# Patient Record
Sex: Female | Born: 1935 | Race: White | Hispanic: No | Smoking: Never smoker
Health system: Southern US, Community
[De-identification: ages and names within clinical notes are randomized; demographics above are authoritative.]

## PROBLEM LIST (undated history)

## (undated) DIAGNOSIS — Z860100 Personal history of colon polyps, unspecified: Secondary | ICD-10-CM

## (undated) DIAGNOSIS — C801 Malignant (primary) neoplasm, unspecified: Secondary | ICD-10-CM

## (undated) DIAGNOSIS — M545 Low back pain, unspecified: Secondary | ICD-10-CM

## (undated) DIAGNOSIS — I10 Essential (primary) hypertension: Secondary | ICD-10-CM

## (undated) DIAGNOSIS — J45909 Unspecified asthma, uncomplicated: Secondary | ICD-10-CM

## (undated) DIAGNOSIS — M25473 Effusion, unspecified ankle: Secondary | ICD-10-CM

## (undated) DIAGNOSIS — R413 Other amnesia: Secondary | ICD-10-CM

## (undated) DIAGNOSIS — R0602 Shortness of breath: Secondary | ICD-10-CM

## (undated) DIAGNOSIS — C50419 Malignant neoplasm of upper-outer quadrant of unspecified female breast: Secondary | ICD-10-CM

## (undated) DIAGNOSIS — F0390 Unspecified dementia without behavioral disturbance: Secondary | ICD-10-CM

## (undated) DIAGNOSIS — E611 Iron deficiency: Secondary | ICD-10-CM

## (undated) DIAGNOSIS — C7650 Malignant neoplasm of unspecified lower limb: Secondary | ICD-10-CM

## (undated) DIAGNOSIS — E785 Hyperlipidemia, unspecified: Secondary | ICD-10-CM

## (undated) DIAGNOSIS — D649 Anemia, unspecified: Secondary | ICD-10-CM

## (undated) DIAGNOSIS — K219 Gastro-esophageal reflux disease without esophagitis: Secondary | ICD-10-CM

## (undated) DIAGNOSIS — M1990 Unspecified osteoarthritis, unspecified site: Secondary | ICD-10-CM

## (undated) DIAGNOSIS — I4891 Unspecified atrial fibrillation: Secondary | ICD-10-CM

## (undated) HISTORY — DX: Unspecified asthma, uncomplicated: J45.909

## (undated) HISTORY — DX: Essential (primary) hypertension: I10

## (undated) HISTORY — PX: MASTECTOMY: SHX3

## (undated) HISTORY — DX: Gastro-esophageal reflux disease without esophagitis: K21.9

## (undated) HISTORY — DX: Low back pain: M54.5

## (undated) HISTORY — DX: Hyperlipidemia, unspecified: E78.5

## (undated) HISTORY — DX: Other amnesia: R41.3

## (undated) HISTORY — DX: Unspecified atrial fibrillation: I48.91

## (undated) HISTORY — DX: Anemia, unspecified: D64.9

## (undated) HISTORY — DX: Malignant (primary) neoplasm, unspecified: C80.1

## (undated) HISTORY — DX: Shortness of breath: R06.02

## (undated) HISTORY — DX: Effusion, unspecified ankle: M25.473

## (undated) HISTORY — DX: Malignant neoplasm of upper-outer quadrant of unspecified female breast: C50.419

## (undated) HISTORY — DX: Personal history of colonic polyps: Z86.010

## (undated) HISTORY — DX: Unspecified dementia, unspecified severity, without behavioral disturbance, psychotic disturbance, mood disturbance, and anxiety: F03.90

## (undated) HISTORY — DX: Malignant neoplasm of unspecified lower limb: C76.50

## (undated) HISTORY — DX: Low back pain, unspecified: M54.50

## (undated) HISTORY — DX: Iron deficiency: E61.1

## (undated) HISTORY — DX: Personal history of colon polyps, unspecified: Z86.0100

## (undated) HISTORY — DX: Unspecified osteoarthritis, unspecified site: M19.90

---

## 2005-07-04 ENCOUNTER — Ambulatory Visit: Payer: Self-pay | Admitting: Internal Medicine

## 2006-07-10 ENCOUNTER — Ambulatory Visit: Payer: Self-pay | Admitting: Family Medicine

## 2006-07-15 ENCOUNTER — Ambulatory Visit: Payer: Self-pay | Admitting: Family

## 2006-09-10 HISTORY — PX: SKIN CANCER EXCISION: SHX779

## 2007-01-15 ENCOUNTER — Ambulatory Visit: Payer: Self-pay | Admitting: Family Medicine

## 2007-01-16 ENCOUNTER — Ambulatory Visit: Payer: Self-pay | Admitting: Family Medicine

## 2007-06-12 ENCOUNTER — Other Ambulatory Visit: Payer: Self-pay

## 2007-06-12 ENCOUNTER — Ambulatory Visit: Payer: Self-pay | Admitting: Surgery

## 2007-06-16 ENCOUNTER — Ambulatory Visit: Payer: Self-pay | Admitting: Surgery

## 2007-07-23 ENCOUNTER — Ambulatory Visit: Payer: Self-pay | Admitting: Family Medicine

## 2007-07-30 ENCOUNTER — Ambulatory Visit: Payer: Self-pay | Admitting: Family Medicine

## 2007-09-11 HISTORY — PX: BREAST BIOPSY: SHX20

## 2008-03-03 ENCOUNTER — Ambulatory Visit: Payer: Self-pay | Admitting: Family Medicine

## 2008-03-10 ENCOUNTER — Ambulatory Visit: Payer: Self-pay | Admitting: Family Medicine

## 2008-09-10 HISTORY — PX: COLONOSCOPY: SHX174

## 2008-11-10 ENCOUNTER — Ambulatory Visit: Payer: Self-pay | Admitting: General Surgery

## 2009-02-02 ENCOUNTER — Ambulatory Visit: Payer: Self-pay | Admitting: Gastroenterology

## 2009-05-04 ENCOUNTER — Ambulatory Visit: Payer: Self-pay | Admitting: Family Medicine

## 2009-07-27 ENCOUNTER — Ambulatory Visit: Payer: Self-pay | Admitting: Family Medicine

## 2009-09-10 DIAGNOSIS — C801 Malignant (primary) neoplasm, unspecified: Secondary | ICD-10-CM

## 2009-09-10 DIAGNOSIS — I4891 Unspecified atrial fibrillation: Secondary | ICD-10-CM

## 2009-09-10 HISTORY — DX: Unspecified atrial fibrillation: I48.91

## 2009-09-10 HISTORY — DX: Malignant (primary) neoplasm, unspecified: C80.1

## 2010-01-18 ENCOUNTER — Ambulatory Visit: Payer: Self-pay | Admitting: Family Medicine

## 2010-02-01 ENCOUNTER — Ambulatory Visit: Payer: Self-pay | Admitting: Family Medicine

## 2010-02-28 ENCOUNTER — Ambulatory Visit: Payer: Self-pay | Admitting: General Surgery

## 2010-03-06 ENCOUNTER — Ambulatory Visit: Payer: Self-pay | Admitting: Cardiovascular Disease

## 2010-03-06 DIAGNOSIS — I10 Essential (primary) hypertension: Secondary | ICD-10-CM

## 2010-03-06 DIAGNOSIS — I4891 Unspecified atrial fibrillation: Secondary | ICD-10-CM

## 2010-03-07 ENCOUNTER — Ambulatory Visit: Payer: Self-pay

## 2010-03-10 ENCOUNTER — Ambulatory Visit: Payer: Self-pay | Admitting: Cardiovascular Disease

## 2010-03-10 HISTORY — PX: BREAST SURGERY: SHX581

## 2010-03-14 ENCOUNTER — Ambulatory Visit: Payer: Self-pay | Admitting: Cardiovascular Disease

## 2010-03-29 ENCOUNTER — Inpatient Hospital Stay: Payer: Self-pay | Admitting: General Surgery

## 2010-06-08 ENCOUNTER — Inpatient Hospital Stay: Payer: Self-pay | Admitting: Internal Medicine

## 2010-06-08 ENCOUNTER — Ambulatory Visit: Payer: Self-pay | Admitting: Internal Medicine

## 2010-06-08 ENCOUNTER — Encounter: Payer: Self-pay | Admitting: Cardiovascular Disease

## 2010-06-10 ENCOUNTER — Encounter: Payer: Self-pay | Admitting: Cardiovascular Disease

## 2010-06-12 ENCOUNTER — Telehealth: Payer: Self-pay | Admitting: Cardiovascular Disease

## 2010-06-13 ENCOUNTER — Telehealth: Payer: Self-pay | Admitting: Cardiovascular Disease

## 2010-06-15 ENCOUNTER — Ambulatory Visit: Payer: Self-pay | Admitting: Cardiovascular Disease

## 2010-06-21 ENCOUNTER — Ambulatory Visit: Payer: Self-pay | Admitting: Cardiovascular Disease

## 2010-06-21 LAB — CONVERTED CEMR LAB

## 2010-06-28 ENCOUNTER — Ambulatory Visit: Payer: Self-pay | Admitting: Internal Medicine

## 2010-07-05 ENCOUNTER — Ambulatory Visit: Payer: Self-pay | Admitting: Cardiovascular Disease

## 2010-07-05 LAB — CONVERTED CEMR LAB: POC INR: 1.7

## 2010-07-12 ENCOUNTER — Ambulatory Visit: Payer: Self-pay | Admitting: Cardiovascular Disease

## 2010-07-12 LAB — CONVERTED CEMR LAB: POC INR: 2

## 2010-07-26 ENCOUNTER — Ambulatory Visit: Payer: Self-pay | Admitting: Cardiovascular Disease

## 2010-08-09 ENCOUNTER — Ambulatory Visit: Payer: Self-pay | Admitting: Cardiovascular Disease

## 2010-08-09 LAB — CONVERTED CEMR LAB: POC INR: 1.8

## 2010-08-23 ENCOUNTER — Ambulatory Visit: Payer: Self-pay | Admitting: Cardiovascular Disease

## 2010-08-23 LAB — CONVERTED CEMR LAB: POC INR: 2.3

## 2010-08-24 ENCOUNTER — Telehealth: Payer: Self-pay | Admitting: Cardiovascular Disease

## 2010-09-15 ENCOUNTER — Ambulatory Visit: Admission: RE | Admit: 2010-09-15 | Discharge: 2010-09-15 | Payer: Self-pay | Source: Home / Self Care

## 2010-09-15 ENCOUNTER — Ambulatory Visit
Admission: RE | Admit: 2010-09-15 | Discharge: 2010-09-15 | Payer: Self-pay | Source: Home / Self Care | Attending: Cardiovascular Disease | Admitting: Cardiovascular Disease

## 2010-09-15 ENCOUNTER — Encounter: Payer: Self-pay | Admitting: Cardiovascular Disease

## 2010-09-15 LAB — CONVERTED CEMR LAB: POC INR: 2

## 2010-10-02 ENCOUNTER — Encounter: Payer: Self-pay | Admitting: Cardiovascular Disease

## 2010-10-10 NOTE — Medication Information (Signed)
Summary: rov/ewj  Anticoagulant Therapy  Managed by: Cloyde Reams, RN, BSN Referring MD: Dr Mariah Milling PCP: Lorie Phenix, M.D Supervising MD: Mariah Milling Indication 1: Atrial Fibrillation 427.31 Lab Used: [facility] [place] Site: [place] INR POC 1.7 INR RANGE 2.0-3.0  Dietary changes: no    Health status changes: no    Bleeding/hemorrhagic complications: no    Recent/future hospitalizations: no    Any changes in medication regimen? no    Recent/future dental: no  Any missed doses?: no       Is patient compliant with meds? yes       Allergies: No Known Drug Allergies  Anticoagulation Management History:      The patient is taking warfarin and comes in today for a routine follow up visit.  Positive risk factors for bleeding include an age of 60 years or older.  The bleeding index is 'intermediate risk'.  Positive CHADS2 values include History of HTN and Age > 25 years old.  Anticoagulation responsible provider: Goll[place].  INR POC: 1.7.  Cuvette Lot#: [id number].  Exp: 07/2011.    Anticoagulation Management Assessment/Plan:      The patient's current anticoagulation dose is Warfarin sodium 2 mg tabs: Use as directed by Anticoagualtion Clinic.  The target INR is 2.0-3.0.  The next INR is due 07/12/2010.  Results were reviewed/authorized by Cloyde Reams, RN, BSN.  She was notified by Cloyde Reams RN.         Prior Anticoagulation Instructions: INR 1.5  Start taking 1.5 tablets daily except 2 tablets on Mondays, Wednesdays, and Fridays.  Recheck in 1 week.   Current Anticoagulation Instructions: INR 1.7  Start taking 2 tablets daily except 1.5 tablets on Tuesdays and Saturdays.  Recheck in 1 week.

## 2010-10-10 NOTE — Medication Information (Signed)
Summary: rov/ewj  Anticoagulant Therapy  Managed by: Bethena Midget, RN, BSN Referring MD: Dr Mariah Milling PCP: Lorie Phenix, M.D Supervising MD: Mariah Milling Indication 1: Atrial Fibrillation 427.31 Lab Used: [facility] [place] Site: [place] INR POC 1.8 INR RANGE 2.0-3.0  Dietary changes: no    Health status changes: no    Bleeding/hemorrhagic complications: no    Recent/future hospitalizations: no    Any changes in medication regimen? yes       Details: Centrum Silver  Recent/future dental: no  Any missed doses?: no       Is patient compliant with meds? yes       Allergies: No Known Drug Allergies  Anticoagulation Management History:      The patient is taking warfarin and comes in today for a routine follow up visit.  Positive risk factors for bleeding include an age of 75 years or older.  The bleeding index is 'intermediate risk'.  Positive CHADS2 values include History of HTN and Age > 74 years old.  Anticoagulation responsible Jill Ellison: Gollan.  INR POC: 1.8.  Cuvette Lot#: [id number].  Exp: 08/2011.    Anticoagulation Management Assessment/Plan:      The patient's current anticoagulation dose is Warfarin sodium 2 mg tabs: Use as directed by Anticoagualtion Clinic.  The target INR is 2.0-3.0.  The next INR is due 08/09/2010.  Anticoagulation instructions were given to patient.  Results were reviewed/authorized by Bethena Midget, RN, BSN.  She was notified by Bethena Midget, RN, BSN.         Prior Anticoagulation Instructions: INR 2.0  Start taking 2 tablets daily except 1.5 tablets on Tuesdays. Recheck 2 weeks.   Current Anticoagulation Instructions: INR  1.8 Today take 2 1/2 tablets then change dose to 2 tablets everyday. Recheck in 2 weeks.

## 2010-10-10 NOTE — Assessment & Plan Note (Signed)
Summary: ROV   Visit Type:  Follow-up Primary Fontaine Hehl:  Lorie Phenix, M.D  CC:  F/U [facility].  [name]  History of Present Illness: Jill Ellison is a very pleasant 77 year old woman with a history of chronic back pain, recent history of lumpectomy identifying breast cancer, mastectomy on the left with Dr. Doristine Counter who presents 4 routine followup. She was diagnosed with atrial fibrillation prior to her surgery and was not started on warfarin as her surgery was pending.  she was started on rate following medications such as diltiazem, digoxin with Lasix. She states that she did well through her surgery and currently has no complaints. She denies any chest pain, shortness of breath. She does have slight edema in her legs which comes and goes. She does drink a significant amount of fluid including water.She denies any periods of tachycardia and is able to ambulate without any falls and has no significant gait instability.   EKG today shows atrial fibrillation with rate of in the 70s with no significant ST or T wave changes  Current Medications (verified): 1)  Boniva 150 Mg Tabs (Ibandronate Sodium) .... Once A Month As Directed 2)  Xopenex Hfa 45 Mcg/act Aero (Levalbuterol Tartrate) .... Two Puffs Every Six Hours 3)  Cyclobenzaprine Hcl 5 Mg Tabs (Cyclobenzaprine Hcl) .... As Needed 4)  Fexofenadine Hcl 180 Mg Tabs (Fexofenadine Hcl) .[name].. 1 Once Daily 5)  Hydrocodone-Acetaminophen 5-500 Mg Tabs (Hydrocodone-Acetaminophen) .... As Needed 6)  Ferrex 150 Forte 150-25-1 Mg-Mcg-Mg Caps (Iron Polysacch Cmplx-B12-Fa) .[name].. 1 Capsule Two Times A Day 7)  Os-Cal 500 + D 500-200 Mg-Unit Tabs (Calcium Carbonate-Vitamin D) .[name].. 1 Two Times A Day 8)  Magnesium 250 Mg Tabs (Magnesium) .... Four Tablets A Day 9)  Vitamin D3 1000 Unit Tabs (Cholecalciferol) .[name].. 1 Once Daily 10)  Cardizem Cd 240 Mg Xr24h-Cap (Diltiazem Hcl Coated Beads) .[name].. 1 Once Daily 11)  Digoxin 0.125 Mg Tabs (Digoxin) .... Take Two Tablets Today  and Then One Tablet Daily 12)  Lisinopril 20 Mg Tabs (Lisinopril) .... Take One Tablet By Mouth Daily 13)  Potassium Chloride Crys Cr 20 Meq Cr-Tabs (Potassium Chloride Crys Cr) .... Take One Tablet By Mouth Daily 14)  Furosemide 40 Mg Tabs (Furosemide) .... One Tablet Once Daily  Allergies (verified): No Known Drug Allergies  Past History:  Past Medical History: Last updated: 03/06/2010 swelling in ankles shortness of breath hypertension  Past Surgical History: Last updated: 03/06/2010 skin cancer removed on right leg- 2008  Family History: Last updated: 03/06/2010 Family History of Cancer: mother, father, siblings Family History of Diabetes: brother Family History of Hypertension: siblings Family History of Sudden Cardiac Death: brother Family History of Coronary Artery Disease: brothers  Social History: Last updated: 03/06/2010 Retired from [employer] Tobacco Use - No.  Alcohol Use - no Regular Exercise - yes- yard work Drug Use - no Widowed   Risk Factors: Exercise: yes (03/06/2010)  Risk Factors: Smoking Status: never (03/06/2010)  Review of Systems       The patient complains of peripheral edema.  The patient denies fever, weight loss, weight gain, vision loss, decreased hearing, hoarseness, chest pain, syncope, dyspnea on exertion, prolonged cough, abdominal pain, incontinence, muscle weakness, depression, and enlarged lymph nodes.    Vital Signs:  Patient profile:   77 year old female Height:      61 inches Weight:      145 pounds BMI:     27.50 Pulse rate:   73 / minute BP sitting:  158 / 67  (left arm) Cuff size:   regular  Vitals Entered By: Bishop Dublin, CMA (June 15, 2010 2:43 PM)  Physical Exam  General:  Well developed, well nourished, in no acute distress. Head:  normocephalic and atraumatic Neck:  Neck supple, no JVD. No masses, thyromegaly or abnormal cervical nodes. Lungs:  Clear bilaterally to auscultation and  percussion. Heart:  Non-displaced PMI, chest non-tender; irregular rate and rhythm, S1, S2 without murmurs, rubs or gallops. Carotid upstroke normal, no bruit. . Pedals normal pulses. Trace edema, no varicosities. Abdomen:  Bowel sounds positive; abdomen soft and non-tender without masses Msk:  Back normal, normal gait. Muscle strength and tone normal. Pulses:  pulses normal in all 4 extremities Extremities:  No clubbing or cyanosis. Neurologic:  Alert and oriented x 3. Skin:  Intact without lesions or rashes. Psych:  Normal affect.   Impression & Recommendations:  Problem # 1:  ATRIAL FIBRILLATION (ICD-427.31) we have discussed with Ms. Bradeen the various treatment options for her atrial fibrillation. She currently has adequate rate control. We have talked about anticoagulation with her. Given that her echocardiogram is essentially normal with only mild abnormalities, we could potentially aimed for cardioversion in one to 2 months time. With this in mind, we will start her on warfarin with a goal INR of 2 or higher, preferably less than 2.5.  Her updated medication list for this problem includes:    Digoxin 0.125 Mg Tabs (Digoxin) .[name]... Take two tablets today and then one tablet daily    Warfarin Sodium 2 Mg Tabs (Warfarin sodium) ..... Use as directed by anticoagualtion clinic  Problem # 2:  HYPERTENSION, BENIGN (ICD-401.1) Pressure on recheck was well controlled. No further medication changes were made.  Her updated medication list for this problem includes:    Cardizem Cd 240 Mg Xr24h-cap (Diltiazem hcl coated beads) .[name]... 1 once daily    Lisinopril 20 Mg Tabs (Lisinopril) .[name]... Take one tablet by mouth daily    Furosemide 40 Mg Tabs (Furosemide) ..... One tablet once daily  Patient Instructions: 1)  Your physician recommends that you schedule a follow-up appointment in: Next Wednesday with Cicero Duck 2)  Your physician has recommended you make the following change in your  medication: Start warfarin 2mg  daily  3)  Your physician wants you to follow-up in:   3 months You will receive a reminder letter in the mail two months in advance. If you don't receive a letter, please call our office to schedule the follow-up appointment. Prescriptions: WARFARIN SODIUM 2 MG TABS (WARFARIN SODIUM) Use as directed by Anticoagualtion Clinic  #45 x 2   Entered by:   Benedict Needy, RN   Authorized by:   Dossie Arbour MD   Signed by:   Benedict Needy, RN on 06/15/2010   Method used:   [facility] to        [facility] [phone]* (retail)       [address] [place], [place]  [address]       Ph: [phone] or [phone]       Fax: [phone]   RxID:   [id number]   Appended Document: ROV INR can be checked either by Dr. Elease Hashimoto or our office. I will defer this to Dr. Elease Hashimoto.

## 2010-10-10 NOTE — Assessment & Plan Note (Signed)
Summary: SURGICAL CLEARANCE   Visit Type:  Initial Consult Primary Provider:  Lorie Phenix, M.D  CC:  cardiac clearance for right mastectomy tomorrow.  History of Present Illness: [name] is a very pleasant 77 year old woman with a history of chronic back pain, recent history of lumpectomy identifying breast cancer, scheduled for a mastectomy with Dr. Doristine Counter who presents for preoperative evaluation. Her surgery is scheduled for tomorrow morning.  She was seen in preoperative evaluation last week and found to be in atrial fibrillation. Heart rate at that time was greater than 100 beats per minute. She denies any significant shortness of breath, no chest pain, no lightheadedness. She is able to be active and do everything that she would like to do. She denies any lower extremity edema, no cough, no lightheadedness. She is able to push a boggy around [employer] with her sister at a slow to medium pace and does not have to slow down.  EKG today shows atrial fibrillation with rate of 107 beats per minute, no significant ST or T wave changes noted.   Preventive Screening-Counseling & Management  Alcohol-Tobacco     Smoking Status: never  Caffeine-Diet-Exercise     Does Patient Exercise: yes      Drug Use:  no.    Current Medications (verified): 1)  Boniva 150 Mg Tabs (Ibandronate Sodium) .... Once A Month As Directed 2)  Xopenex Hfa 45 Mcg/act Aero (Levalbuterol Tartrate) .... Two Puffs Every Six Hours 3)  Cyclobenzaprine Hcl 5 Mg Tabs (Cyclobenzaprine Hcl) .... As Needed 4)  Fexofenadine Hcl 180 Mg Tabs (Fexofenadine Hcl) .[name].. 1 Once Daily 5)  Amlodipine Besy-Benazepril Hcl 10-20 Mg Caps (Amlodipine Besy-Benazepril Hcl) .[name].. 1 Once Daily 6)  Hydrocodone-Acetaminophen 5-500 Mg Tabs (Hydrocodone-Acetaminophen) .... As Needed 7)  Ferrex 150 Forte 150-25-1 Mg-Mcg-Mg Caps (Iron Polysacch Cmplx-B12-Fa) .[name].. 1 Capsule Two Times A Day 8)  Ibuprofen 200 Mg Tabs (Ibuprofen) .... As Needed 9)   Os-Cal 500 + D 500-200 Mg-Unit Tabs (Calcium Carbonate-Vitamin D) .[name].. 1 Two Times A Day 10)  Magnesium 250 Mg Tabs (Magnesium) .... Four Tablets A Day 11)  Vitamin D3 1000 Unit Tabs (Cholecalciferol) .[name].. 1 Once Daily  Allergies (verified): No Known Drug Allergies  Past History:  Family History: Last updated: 03/06/2010 Family History of Cancer: mother, father, siblings Family History of Diabetes: brother Family History of Hypertension: siblings Family History of Sudden Cardiac Death: brother Family History of Coronary Artery Disease: brothers  Social History: Last updated: 03/06/2010 Retired from [employer] Tobacco Use - No.  Alcohol Use - no Regular Exercise - yes- yard work Drug Use - no Widowed   Risk Factors: Exercise: yes (03/06/2010)  Risk Factors: Smoking Status: never (03/06/2010)  Past Medical History: swelling in ankles shortness of breath hypertension  Past Surgical History: skin cancer removed on right leg- 2008  Family History: Family History of Cancer: mother, father, siblings Family History of Diabetes: brother Family History of Hypertension: siblings Family History of Sudden Cardiac Death: brother Family History of Coronary Artery Disease: brothers  Social History: Retired from [employer] Tobacco Use - No.  Alcohol Use - no Regular Exercise - yes- yard work Drug Use - no Widowed  Smoking Status:  never Does Patient Exercise:  yes Drug Use:  no  Review of Systems  The patient denies fever, weight loss, weight gain, vision loss, decreased hearing, hoarseness, chest pain, syncope, dyspnea on exertion, peripheral edema, prolonged cough, abdominal pain, incontinence, muscle weakness, depression, and enlarged lymph nodes.  Vital Signs:  Patient profile:   77 year old female Height:      61 inches Weight:      150 pounds BMI:     28.44 Pulse rate:   107 / minute BP sitting:   128 / 70  (left arm) Cuff size:   regular  Vitals Entered  By: Bishop Dublin, CMA (March 06, 2010 3:26 PM)  Physical Exam  General:  Well developed, well nourished, in no acute distress. Head:  normocephalic and atraumatic Neck:  Neck supple, no JVD. No masses, thyromegaly or abnormal cervical nodes. Lungs:  Clear bilaterally to auscultation and percussion. Heart:  Non-displaced PMI, chest non-tender; irregular rate and rhythm, S1, S2 without murmurs, rubs or gallops. Carotid upstroke normal, no bruit. . Pedals normal pulses. No edema, no varicosities. Abdomen:  Bowel sounds positive; abdomen soft and non-tender without masses Msk:  Back normal, normal gait. Muscle strength and tone normal. Pulses:  pulses normal in all 4 extremities Extremities:  No clubbing or cyanosis. Neurologic:  Alert and oriented x 3. Skin:  Intact without lesions or rashes. Psych:  Normal affect.   Impression & Recommendations:  Problem # 1:  ATRIAL FIBRILLATION (ICD-427.31) new atrial fibrillation noted on EKG last week and again today. I suspect that she has been in atrial fibrillation since sometime in December when she first noted an irregular heartbeat. She has not had any significant limitations in her ability to exert herself. Her rate is moderately elevated, >100 bpm  We have talked with Dr. Doristine Counter and we will put her mastectomy surgery on hold for several days. In the meantime, we have ordered an echocardiogram for tomorrow, have held her amlodipine and benazepril and started her on diltiazem 180 mg daily.  We will perform an EKG on Friday to confirm that she has adequate rate control and make further adjustments at that time. If her rate is well controlled, she could proceed with surgery per Dr. Doristine Counter. We talked to her about warfarin and this could be started after her surgery. Her INR could be managed through our office  Orders: Echocardiogram (Echo)  Problem # 2:  HYPERTENSION, BENIGN (ICD-401.1) Blood pressure is reasonably well controlled. We are  changing some of her medications to optimize her rate control and will follow up with an EKG and blood pressure check on Friday.  The following medications were removed from the medication list:    Amlodipine Besy-benazepril Hcl 10-20 Mg Caps (Amlodipine besy-benazepril hcl) .[name]... 1 once daily Her updated medication list for this problem includes:    Diltiazem Hcl Er Beads 180 Mg Xr24h-cap (Diltiazem hcl er beads) .[name]... Take one capsule by mouth daily  Patient Instructions: 1)  Your physician recommends that you schedule a follow-up appointment in: Nurse visit friday afternoon 2)  Your physician has recommended you make the following change in your medication: STOP amolodipine-benzapril START diltiazem 180mg  daily.  3)  Your physician has requested that you have an echocardiogram.  Echocardiography is a painless test that uses sound waves to create images of your heart. It provides your doctor with information about the size and shape of your heart and how well your heart's chambers and valves are working.  This procedure takes approximately one hour. There are no restrictions for this procedure. ASAP  Prescriptions: DILTIAZEM HCL ER BEADS 180 MG XR24H-CAP (DILTIAZEM HCL ER BEADS) Take one capsule by mouth daily  #30 x 6   Entered by:   Benedict Needy, RN   Authorized by:  Dossie Arbour MD   Signed by:   Benedict Needy, RN on 03/06/2010   Method used:   [facility] to        [facility] [phone]* (retail)       [address] [place], [place]  [address]       Ph: [phone] or [phone]       Fax: [phone]   RxID:   [id number]

## 2010-10-10 NOTE — Assessment & Plan Note (Signed)
Summary: ekg  Nurse Visit   Vital Signs:  Patient profile:   77 year old female Height:      61 inches Weight:      149 pounds BMI:     28.26 Pulse rate:   95 / minute BP sitting:   137 / 78  (right arm) Cuff size:   regular  Vitals Entered By: Bishop Dublin, CMA (March 10, 2010 11:12 AM)  Current Medications (verified): 1)  Boniva 150 Mg Tabs (Ibandronate Sodium) .... Once A Month As Directed 2)  Xopenex Hfa 45 Mcg/act Aero (Levalbuterol Tartrate) .... Two Puffs Every Six Hours 3)  Cyclobenzaprine Hcl 5 Mg Tabs (Cyclobenzaprine Hcl) .... As Needed 4)  Fexofenadine Hcl 180 Mg Tabs (Fexofenadine Hcl) .[name].. 1 Once Daily 5)  Hydrocodone-Acetaminophen 5-500 Mg Tabs (Hydrocodone-Acetaminophen) .... As Needed 6)  Ferrex 150 Forte 150-25-1 Mg-Mcg-Mg Caps (Iron Polysacch Cmplx-B12-Fa) .[name].. 1 Capsule Two Times A Day 7)  Ibuprofen 200 Mg Tabs (Ibuprofen) .... As Needed 8)  Os-Cal 500 + D 500-200 Mg-Unit Tabs (Calcium Carbonate-Vitamin D) .[name].. 1 Two Times A Day 9)  Magnesium 250 Mg Tabs (Magnesium) .... Four Tablets A Day 10)  Vitamin D3 1000 Unit Tabs (Cholecalciferol) .[name].. 1 Once Daily 11)  Cardizem Cd 240 Mg Xr24h-Cap (Diltiazem Hcl Coated Beads) .[name].. 1 Once Daily 12)  Digoxin 0.125 Mg Tabs (Digoxin) .... Take Two Tablets Today and Then One Tablet Daily  Allergies (verified): No Known Drug Allergies  Patient Instructions: 1)  Your physician recommends that you schedule a follow-up appointment in: 2 weeks after procedure 2)  Your physician has recommended you make the following change in your medication: Start digoxin 0.125 2 today and then one tablet daily and diltiazem  increased to 240mg  daily.    Orders Added: 1)  EKG w/ Interpretation [93000] Prescriptions: CARDIZEM CD 240 MG XR24H-CAP (DILTIAZEM HCL COATED BEADS) 1 once daily  #30 x 1   Entered by:   Bishop Dublin, CMA   Authorized by:   Dossie Arbour MD   Signed by:   Bishop Dublin, CMA on 03/10/2010   Method used:    [facility]ron[facility] to        [facility] [phone]* (retail)       [address] [place], [place]  [address]       Ph: [phone] or [phone]       Fax: [phone]   RxID:   [id number] DIGOXIN 0.125 MG TABS (DIGOXIN) take two tablets today and then one tablet daily  #32 x 1   Entered by:   Bishop Dublin, CMA   Authorized by:   Dossie Arbour MD   Signed by:   Bishop Dublin, CMA on 03/10/2010   Method used:   Electronically to        [facility] [phone]* (retail)       [address] [place], [place]  [address]       Ph: [phone] or [phone]       Fax: [phone]   RxID:   [id number]

## 2010-10-10 NOTE — Medication Information (Signed)
Summary: NEWCCR/ALT  Anticoagulant Therapy  Managed by: Cloyde Reams, RN, BSN Referring MD: Dr Mariah Milling PCP: Jill Ellison, M.D Supervising MD: Mariah Milling Indication 1: Atrial Fibrillation 427.31 Lab Used: [facility] [name] Site: [place] INR POC 1.2 INR RANGE 2.0-3.0  Dietary changes: yes       Details: Pt educated on importance of consistancy of vit K in the diet. Vit K list of foods given to pt.   Health status changes: no    Bleeding/hemorrhagic complications: yes       Details: Pt educated to monitor for s+s of bleeding and to call with problems or concerns.   Recent/future hospitalizations: no    Any changes in medication regimen? yes       Details: Medications reviewed and updated.  Pt is aware to contact us with any new or discontinued medications.   Recent/future dental: no  Any missed doses?: no       Is patient compliant with meds? no     Details: Educated pt on importance of medication compliance, taking Coumadin daily at the same time of day.   Comments: Started on Coumadin 06/15/10 2mg  daily. New pt education done.   Allergies: No Known Drug Allergies  Anticoagulation Management History:      The patient is taking warfarin and comes in today for a routine follow up visit.  Positive risk factors for bleeding include an age of 75 years or older.  The bleeding index is 'intermediate risk'.  Positive CHADS2 values include History of HTN and Age > 55 years old.  Anticoagulation responsible Jill Ellison: Jill Ellison.  INR POC: 1.2.  Cuvette Lot#: [id number].  Exp: 07/2011.    Anticoagulation Management Assessment/Plan:      The patient's current anticoagulation dose is Warfarin sodium 2 mg tabs: Use as directed by Anticoagualtion Clinic.  The target INR is 2.0-3.0.  The next INR is due 06/28/2010.  Results were reviewed/authorized by Cloyde Reams, RN, BSN.  She was notified by Cloyde Reams RN.         Current Anticoagulation Instructions: INR 1.2  Start taking  1.5 tablets daily.  Recheck in 1 week.

## 2010-10-10 NOTE — Letter (Signed)
Summary: Historic Patient File  Historic Patient File   Imported By: West Carbo 03/07/2010 10:27:11  _____________________________________________________________________  External Attachment:    Type:   Image     Comment:   External Document

## 2010-10-10 NOTE — Consult Note (Signed)
[facility]   [facility]   Imported By: Roderic Ovens 06/21/2010 10:17:27  _____________________________________________________________________  External Attachment:    Type:   Image     Comment:   External Document

## 2010-10-10 NOTE — Letter (Signed)
Summary: Historic Patient File  Historic Patient File   Imported By: West Carbo 03/07/2010 10:26:50  _____________________________________________________________________  External Attachment:    Type:   Image     Comment:   External Document

## 2010-10-10 NOTE — Medication Information (Signed)
Summary: rov/ewj  Anticoagulant Therapy  Managed by: Cloyde Reams, RN, BSN Referring MD: Dr Mariah Milling PCP: Lorie Phenix, M.D Supervising MD: Mariah Milling Indication 1: Atrial Fibrillation 427.31 Lab Used: [facility] [place] Site: [place] INR POC 2.0 INR RANGE 2.0-3.0           Allergies: No Known Drug Allergies  Anticoagulation Management History:      The patient is taking warfarin and comes in today for a routine follow up visit.  Positive risk factors for bleeding include an age of 75 years or older.  The bleeding index is 'intermediate risk'.  Positive CHADS2 values include History of HTN and Age > 75 years old.  Anticoagulation responsible provider: Gollan.  INR POC: 2.0.  Cuvette Lot#: [id number].  Exp: 07/2011.    Anticoagulation Management Assessment/Plan:      The patient's current anticoagulation dose is Warfarin sodium 2 mg tabs: Use as directed by Anticoagualtion Clinic.  The target INR is 2.0-3.0.  The next INR is due 07/26/2010.  Results were reviewed/authorized by Cloyde Reams, RN, BSN.  She was notified by Cloyde Reams RN.         Prior Anticoagulation Instructions: INR 1.7  Start taking 2 tablets daily except 1.5 tablets on Tuesdays and Saturdays.  Recheck in 1 week.   Current Anticoagulation Instructions: INR 2.0  Start taking 2 tablets daily except 1.5 tablets on Tuesdays. Recheck 2 weeks.

## 2010-10-10 NOTE — Medication Information (Signed)
Summary: rov/tm  Anticoagulant Therapy  Managed by: Bethena Midget, RN, BSN Referring MD: Dr Mariah Milling PCP: Lorie Phenix, M.D Supervising MD: Mariah Milling Indication 1: Atrial Fibrillation 427.31 Lab Used: [facility] [place] Site: [place] INR POC 1.8 INR RANGE 2.0-3.0  Dietary changes: no    Health status changes: no    Bleeding/hemorrhagic complications: no    Recent/future hospitalizations: no    Any changes in medication regimen? no    Recent/future dental: no  Any missed doses?: no       Is patient compliant with meds? yes       Current Medications (verified): 1)  Boniva 150 Mg Tabs (Ibandronate Sodium) .... Once A Month As Directed 2)  Xopenex Hfa 45 Mcg/act Aero (Levalbuterol Tartrate) .... Two Puffs Every Six Hours 3)  Cyclobenzaprine Hcl 5 Mg Tabs (Cyclobenzaprine Hcl) .... As Needed 4)  Fexofenadine Hcl 180 Mg Tabs (Fexofenadine Hcl) .[name].. 1 Once Daily 5)  Hydrocodone-Acetaminophen 5-500 Mg Tabs (Hydrocodone-Acetaminophen) .... As Needed 6)  Ferrex 150 Forte 150-25-1 Mg-Mcg-Mg Caps (Iron Polysacch Cmplx-B12-Fa) .[name].. 1 Capsule Two Times A Day 7)  Os-Cal 500 + D 500-200 Mg-Unit Tabs (Calcium Carbonate-Vitamin D) .[name].. 1 Two Times A Day 8)  Magnesium 250 Mg Tabs (Magnesium) .... Four Tablets A Day 9)  Vitamin D3 1000 Unit Tabs (Cholecalciferol) .[name].. 1 Once Daily 10)  Cardizem Cd 240 Mg Xr24h-Cap (Diltiazem Hcl Coated Beads) .[name].. 1 Once Daily 11)  Digoxin 0.125 Mg Tabs (Digoxin) .... Take Two Tablets Today and Then One Tablet Daily 12)  Lisinopril 20 Mg Tabs (Lisinopril) .... Take One Tablet By Mouth Daily 13)  Potassium Chloride Crys Cr 20 Meq Cr-Tabs (Potassium Chloride Crys Cr) .... Take One Tablet By Mouth Daily 14)  Furosemide 40 Mg Tabs (Furosemide) .... One Tablet Once Daily 15)  Warfarin Sodium 2 Mg Tabs (Warfarin Sodium) .... Use As Directed By [facility] 16)  Centrum Silver Ultra Womens  Tabs (Multiple Vitamins-Minerals) .... Take 1  Daily  Allergies: No Known Drug Allergies  Anticoagulation Management History:      The patient is taking warfarin and comes in today for a routine follow up visit.  Positive risk factors for bleeding include an age of 62 years or older.  The bleeding index is 'intermediate risk'.  Positive CHADS2 values include History of HTN and Age > 46 years old.  Anticoagulation responsible Mataya Kilduff: Gollan.  INR POC: 1.8.  Cuvette Lot#: [id number].  Exp: 08/2011.    Anticoagulation Management Assessment/Plan:      The patient's current anticoagulation dose is Warfarin sodium 2 mg tabs: Use as directed by Anticoagualtion Clinic.  The target INR is 2.0-3.0.  The next INR is due 08/23/2010.  Anticoagulation instructions were given to patient.  Results were reviewed/authorized by Bethena Midget, RN, BSN.  She was notified by Bethena Midget, RN, BSN.         Prior Anticoagulation Instructions: INR  1.8 Today take 2 1/2 tablets then change dose to 2 tablets everyday. Recheck in 2 weeks.   Current Anticoagulation Instructions: INR 1.8 Today take 3 pills then change dose to 2 pills everyday except 3 pills on Wednesdays. Recheck in 2 weeks.

## 2010-10-10 NOTE — Assessment & Plan Note (Signed)
Summary: EKG/Rapid Heart Beat/sgc,cma  Nurse Visit   Vital Signs:  Patient profile:   77 year old female Height:      61 inches Weight:      148 pounds BMI:     28.07 Pulse rate:   79 / minute BP sitting:   148 / 82  (left arm) Cuff size:   regular  Vitals Entered By: Bishop Dublin, CMA (March 14, 2010 10:02 AM)   Allergies: No Known Drug Allergies  Orders Added: 1)  EKG w/ Interpretation [93000]

## 2010-10-10 NOTE — Medication Information (Signed)
Summary: rov/ewj  Anticoagulant Therapy  Managed by: Cloyde Reams, RN, BSN Referring MD: Dr Mariah Milling PCP: Lorie Phenix, M.D Supervising MD: Gala Romney MD, Reuel Boom Indication 1: Atrial Fibrillation 427.31 Lab Used: [facility] [place] Site: [place] INR POC 1.5 INR RANGE 2.0-3.0  Dietary changes: no    Health status changes: no    Bleeding/hemorrhagic complications: no    Recent/future hospitalizations: no    Any changes in medication regimen? no    Recent/future dental: no  Any missed doses?: no       Is patient compliant with meds? yes       Allergies: No Known Drug Allergies  Anticoagulation Management History:      The patient is taking warfarin and comes in today for a routine follow up visit.  Positive risk factors for bleeding include an age of 75 years or older.  The bleeding index is 'intermediate risk'.  Positive CHADS2 values include History of HTN and Age > 25 years old.  Anticoagulation responsible provider: Bensimhon MD, Reuel Boom.  INR POC: 1.5.  Cuvette Lot#: [id number].  Exp: 07/2011.    Anticoagulation Management Assessment/Plan:      The patient's current anticoagulation dose is Warfarin sodium 2 mg tabs: Use as directed by Anticoagualtion Clinic.  The target INR is 2.0-3.0.  The next INR is due 07/05/2010.  Results were reviewed/authorized by Cloyde Reams, RN, BSN.  She was notified by Cloyde Reams RN.         Prior Anticoagulation Instructions: INR 1.2  Start taking 1.5 tablets daily.  Recheck in 1 week.    Current Anticoagulation Instructions: INR 1.5  Start taking 1.5 tablets daily except 2 tablets on Mondays, Wednesdays, and Fridays.  Recheck in 1 week.

## 2010-10-10 NOTE — Progress Notes (Signed)
Summary: MEDICATION  Phone Note Call from Patient Call back at [phone]   Caller: PAM TURNER (GRAND DAUGHTER) Call For: [facility] Summary of Call: CALLING AGAIN ABOUT PT'S MEDICATION-DID NOT HEAR BACK YESTERDAY-THE NOTE FROM YESTERDAY IS SIGNED. Initial call taken by: Harlon Flor,  June 13, 2010 9:10 AM  Follow-up for Phone Call        Continue all meds as she was doing before the hospital, needs to start lisinopril 20 mg daily. Also needs to monitor BP and call us next week with numbers.  Does she have a lasix 20 mg that she can take as needed for SOB?     Appended Document: MEDICATION    Clinical Lists Changes  Medications: Added new medication of LISINOPRIL 20 MG TABS (LISINOPRIL) Take one tablet by mouth daily - Signed Added new medication of POTASSIUM CHLORIDE CRYS CR 20 MEQ CR-TABS (POTASSIUM CHLORIDE CRYS CR) Take one tablet by mouth daily - Signed Rx of LISINOPRIL 20 MG TABS (LISINOPRIL) Take one tablet by mouth daily;  #30 x 6;  Signed;  Entered by: Benedict Needy, RN;  Authorized by: Dossie Arbour MD;  Method used: [facility]. [phone]*, [address], [place], [place], [place]  [address], Ph: [phone] or [phone], Fax: [phone] Rx of POTASSIUM CHLORIDE CRYS CR 20 MEQ CR-TABS (POTASSIUM CHLORIDE CRYS CR) Take one tablet by mouth daily;  #30 x 2;  Signed;  Entered by: Benedict Needy, RN;  Authorized by: Dossie Arbour MD;  Method used: Electronically to [facility]. [phone]*, [address], [place], [place], [place]  [address], Ph: [phone] or [phone], Fax: [phone]    Prescriptions: POTASSIUM CHLORIDE CRYS CR 20 MEQ CR-TABS (POTASSIUM CHLORIDE CRYS CR) Take one tablet by mouth daily  #30 x 2   Entered by:   Benedict Needy, RN   Authorized by:   Dossie Arbour MD   Signed by:   Benedict Needy, RN on 06/14/2010   Method used:   Electronically to        [facility]. [phone]* (retail)       [address]       [place], [place]  [address]       Ph: [phone] or [phone]       Fax: [phone]   RxID:   [id number] LISINOPRIL 20 MG TABS (LISINOPRIL) Take one tablet by mouth daily  #30 x 6   Entered by:   Benedict Needy, RN   Authorized by:   Dossie Arbour MD   Signed by:   Benedict Needy, RN on 06/14/2010   Method used:   Electronically to        [facility]. [phone]* (retail)       [address] [place], [place]  [address]       Ph: [phone] or [phone]       Fax: [phone]   RxID:   [id number]    Appended Document: MEDICATION LMOM TCB with pt's granddaughter Margrett Rud.   Appended Document: MEDICATION spoke to pt's granddaughter about new medications and s/s of low and high BP. Instructed family to take pt's Bp 2-3 times per day while medications are being titrated. Dr. Mariah Milling suggested that the pt come to office for BMP, she has appt tomorrow.

## 2010-10-10 NOTE — Progress Notes (Signed)
Summary: medication  Phone Note Other Incoming Call back at [phone]   Caller: Margrett Rud )(grandaughter) Details for Reason: Medication questions Summary of Call: Was at [facility] over the weekend and was given Lasix 20 mg one tablet daily and Lotrel 10/40mg  one tablet daily.  The grandaughter is confused on what medications she needs to be taking.  She understood for her to stop the Diltiazem and Digoxin.  Does she need to also take a aspirin daily? Initial call taken by: Bishop Dublin, CMA,  June 12, 2010 11:34 AM  Follow-up for Phone Call        need to get ER notes. Why was she in the ER?

## 2010-10-10 NOTE — Letter (Signed)
Summary[facility]ist, [facility]   [facility]   Imported By: Roderic Ovens 08/15/2010 16:05:45  _____________________________________________________________________  External Attachment:    Type:   Image     Comment:   External Document

## 2010-10-11 ENCOUNTER — Encounter: Payer: Self-pay | Admitting: Cardiovascular Disease

## 2010-10-11 ENCOUNTER — Ambulatory Visit: Admit: 2010-10-11 | Payer: Self-pay

## 2010-10-11 ENCOUNTER — Encounter (INDEPENDENT_AMBULATORY_CARE_PROVIDER_SITE_OTHER): Payer: Medicare Other

## 2010-10-11 DIAGNOSIS — Z7901 Long term (current) use of anticoagulants: Secondary | ICD-10-CM

## 2010-10-11 DIAGNOSIS — I4891 Unspecified atrial fibrillation: Secondary | ICD-10-CM

## 2010-10-11 LAB — CONVERTED CEMR LAB: POC INR: 1.7

## 2010-10-12 LAB — CONVERTED CEMR LAB
Calcium: 8.4 mg/dL (ref 8.4–10.5)
Creatinine, Ser: 0.83 mg/dL (ref 0.40–1.20)
Sodium: 141 meq/L (ref 135–145)

## 2010-10-12 NOTE — Medication Information (Signed)
Summary: seeing Dr Jill Ellison at 10:15am/ewj  Anticoagulant Therapy  Managed by: Jill Hurst, RN Referring MD: Dr Jill Ellison PCP: Jill Ellison, M.D Supervising MD: Jill Ellison Indication 1: Atrial Fibrillation 427.31 Lab Used: [facility] [place] Site: [place] INR POC 2.0 INR RANGE 2.0-3.0  Dietary changes: no    Health status changes: no    Bleeding/hemorrhagic complications: no    Recent/future hospitalizations: no    Any changes in medication regimen? no    Recent/future dental: no  Any missed doses?: no       Is patient compliant with meds? yes       Allergies: No Known Drug Allergies   Anticoagulation Management History:      The patient is taking warfarin and comes in today for a routine follow up visit.  Positive risk factors for bleeding include an age of 75 years or older.  The bleeding index is 'intermediate risk'.  Positive CHADS2 values include History of HTN and Age > 51 years old.  Anticoagulation responsible provider: Hector Taft.  INR POC: 2.0.  Exp: 08/2011.    Anticoagulation Management Assessment/Plan:      The patient's current anticoagulation dose is Warfarin sodium 2 mg tabs: Use as directed by Anticoagualtion Clinic.  The target INR is 2.0-3.0.  The next INR is due 10/11/2010.  Anticoagulation instructions were given to patient.  Results were reviewed/authorized by Jill Hurst, RN.  She was notified by Jill Hurst RN.         Prior Anticoagulation Instructions: INR 2.3  Continue on same dosage 2 tablets daily except 3 tablets on Wednesdays.  Recheck in 3 weeks.    Current Anticoagulation Instructions: INR 2.0  Continue same dosage 2 tablets daily except 3 tablets on Wednesdays. Recheck in 4 weeks.

## 2010-10-12 NOTE — Assessment & Plan Note (Signed)
Summary: F3M/AMD   Visit Type:  Follow-up Primary Provider:  Lorie Phenix, M.D  CC:  "Doing well"..  History of Present Illness: Jill Ellison is a very pleasant 77 year old woman with a history of chronic back pain, recent history of lumpectomy identifying breast cancer, mastectomy on the left with Dr. Doristine Counter who presents for routine followup. She was diagnosed with atrial fibrillation prior to her surgery and was not started on warfarin as her surgery was pending.  she was started on rate controlling medications including diltiazem, digoxin with Lasix. following surgery, she was started on warfarin. She denies any chest pain, shortness of breath. no significant edema in her lower extremities.She denies any periods of tachycardia and is able to ambulate without any falls and has no significant gait instability.  she reports that her blood pressure is elevated today because of some anxiety in getting to the office and problems with traffic.  EKG today shows atrial fibrillation with rate of in the 70s with no significant ST or T wave changes  Current Medications (verified): 1)  Boniva 150 Mg Tabs (Ibandronate Sodium) .... Once A Month As Directed 2)  Xopenex Hfa 45 Mcg/act Aero (Levalbuterol Tartrate) .... Two Puffs Every Six Hours 3)  Cyclobenzaprine Hcl 5 Mg Tabs (Cyclobenzaprine Hcl) .... As Needed 4)  Fexofenadine Hcl 180 Mg Tabs (Fexofenadine Hcl) .[name].. 1 Once Daily 5)  Hydrocodone-Acetaminophen 5-500 Mg Tabs (Hydrocodone-Acetaminophen) .... As Needed 6)  Ferrex 150 Forte 150-25-1 Mg-Mcg-Mg Caps (Iron Polysacch Cmplx-B12-Fa) .[name].. 1 Capsule Two Times A Day 7)  Os-Cal 500 + D 500-200 Mg-Unit Tabs (Calcium Carbonate-Vitamin D) .[name].. 1 Two Times A Day 8)  Magnesium 250 Mg Tabs (Magnesium) .... Four Tablets A Day 9)  Vitamin D3 1000 Unit Tabs (Cholecalciferol) .[name].. 1 Once Daily 10)  Cardizem Cd 240 Mg Xr24h-Cap (Diltiazem Hcl Coated Beads) .[name].. 1 Once Daily 11)  Digoxin 0.125 Mg Tabs  (Digoxin) .... Take Two Tablets Today and Then One Tablet Daily 12)  Lisinopril 20 Mg Tabs (Lisinopril) .... Take One Tablet By Mouth Daily 13)  Potassium Chloride Crys Cr 20 Meq Cr-Tabs (Potassium Chloride Crys Cr) .... Take One Tablet By Mouth Daily 14)  Furosemide 20 Mg Tabs (Furosemide) .... One Tablet Once Daily 15)  Warfarin Sodium 2 Mg Tabs (Warfarin Sodium) .... Use As Directed By [facility] 16)  Centrum Silver Ultra Womens  Tabs (Multiple Vitamins-Minerals) .... Take 1 Daily  Allergies (verified): No Known Drug Allergies  Past History:  Past Medical History: Last updated: 03/06/2010 swelling in ankles shortness of breath hypertension  Past Surgical History: Last updated: 03/06/2010 skin cancer removed on right leg- 2008  Family History: Last updated: 03/06/2010 Family History of Cancer: mother, father, siblings Family History of Diabetes: brother Family History of Hypertension: siblings Family History of Sudden Cardiac Death: brother Family History of Coronary Artery Disease: brothers  Social History: Last updated: 03/06/2010 Retired from [employer] Tobacco Use - No.  Alcohol Use - no Regular Exercise - yes- yard work Drug Use - no Widowed   Risk Factors: Exercise: yes (03/06/2010)  Risk Factors: Smoking Status: never (03/06/2010)  Review of Systems  The patient denies fever, weight loss, weight gain, vision loss, decreased hearing, hoarseness, chest pain, syncope, dyspnea on exertion, peripheral edema, prolonged cough, abdominal pain, incontinence, muscle weakness, depression, and enlarged lymph nodes.    Vital Signs:  Patient profile:   77 year old female Height:      61 inches Weight:      146 pounds  BMI:     27.69 Pulse rate:   65 / minute BP sitting:   160 / 78  (left arm) Cuff size:   regular  Vitals Entered By: Bishop Dublin, CMA (September 15, 2010 10:08 AM)  Physical Exam  General:  Well developed, well nourished, in no acute  distress. Head:  normocephalic and atraumatic Neck:  Neck supple, no JVD. No masses, thyromegaly or abnormal cervical nodes. Lungs:  Clear bilaterally to auscultation and percussion. Heart:  Non-displaced PMI, chest non-tender; irregular rate and rhythm, S1, S2 without murmurs, rubs or gallops. Carotid upstroke normal, no bruit. . Pedals normal pulses. Trace edema, no varicosities. Abdomen:  Bowel sounds positive; abdomen soft and non-tender without masses Msk:  Back normal, normal gait. Muscle strength and tone normal. Pulses:  pulses normal in all 4 extremities Extremities:  No clubbing or cyanosis. Neurologic:  Alert and oriented x 3. Skin:  Intact without lesions or rashes. Psych:  Normal affect.   Impression & Recommendations:  Problem # 1:  ATRIAL FIBRILLATION (ICD-427.31) her heart rate is well controlled on her current medication regimen.. We have discussed the various treatment options including continued anticoagulation with rate control versus DC cardioversion. She would like to think about it and let us know what she would like to do. I asked her to call the office if she would like to schedule a cardioversion.  Echocardiogram did not show any significant cardiac pathology concerning for recurrence of her atrial fibrillation. No significant valvular disease.  Her updated medication list for this problem includes:    Digoxin 0.125 Mg Tabs (Digoxin) .[name]... Take two tablets today and then one tablet daily    Warfarin Sodium 2 Mg Tabs (Warfarin sodium) ..... Use as directed by anticoagualtion clinic  Orders: EKG w/ Interpretation (93000) T-Digoxin ([id number])  Problem # 2:  HYPERTENSION, BENIGN (ICD-401.1) Her blood pressure is elevated and have asked her to monitor her blood pressure at home. She reports typically her systolic pressure is in the 130s. She states she was stressed into the office.  Her updated medication list for this problem includes:    Cardizem Cd 240  Mg Xr24h-cap (Diltiazem hcl coated beads) .[name]... 1 once daily    Lisinopril 20 Mg Tabs (Lisinopril) .[name]... Take one tablet by mouth daily    Furosemide 20 Mg Tabs (Furosemide) ..... One tablet once daily  Orders: T-Basic Metabolic Panel [id number])  Problem # 3:  LONG-TERM (CURRENT) USE OF OTHER MEDICATIONS (ICD-V58.69) She appears to be doing well on her warfarin. No falls. We'll watch her closely.  We will check a basic metabolic panel and digoxin level today as she has been on diuretic and digoxin for several months.  Orders: T-Digoxin ([id number])  Patient Instructions: 1)  Your physician recommends that you schedule a follow-up appointment in: 6 months 2)  Your physician recommends that you continue on your current medications as directed. Please refer to the Current Medication list given to you today. 3)  Your physician has requested that you regularly monitor and record your blood pressure readings at home.  Please use the same machine at the same time of day to check your readings and record them to bring to your follow-up visit and drop off list of measurments at our office in 2 weeks.

## 2010-10-12 NOTE — Medication Information (Signed)
Summary: rov/tm  Anticoagulant Therapy  Managed by: Jill Reams, RN, BSN Referring MD: Dr Jill Ellison PCP: Jill Ellison, Jill Ellison Supervising MD: Jill Ellison Indication 1: Atrial Fibrillation 427.31 Lab Used: [facility] [place] Site: [place] INR POC 2.3 INR RANGE 2.0-3.0  Dietary changes: no    Health status changes: no    Bleeding/hemorrhagic complications: no    Recent/future hospitalizations: no    Any changes in medication regimen? no    Recent/future dental: no  Any missed doses?: no       Is patient compliant with meds? yes       Allergies: No Known Drug Allergies  Anticoagulation Management History:      The patient is taking warfarin and comes in today for a routine follow up visit.  Positive risk factors for bleeding include an age of 75 years or older.  The bleeding index is 'intermediate risk'.  Positive CHADS2 values include History of HTN and Age > 50 years old.  Anticoagulation responsible provider: Gollan.  INR POC: 2.3.  Cuvette Lot#: [id number].  Exp: 08/2011.    Anticoagulation Management Assessment/Plan:      The patient's current anticoagulation dose is Warfarin sodium 2 mg tabs: Use as directed by Anticoagualtion Clinic.  The target INR is 2.0-3.0.  The next INR is due 09/15/2010.  Anticoagulation instructions were given to patient.  Results were reviewed/authorized by Jill Reams, RN, BSN.  She was notified by Jill Reams RN.         Prior Anticoagulation Instructions: INR 1.8 Today take 3 pills then change dose to 2 pills everyday except 3 pills on Wednesdays. Recheck in 2 weeks.   Current Anticoagulation Instructions: INR 2.3  Continue on same dosage 2 tablets daily except 3 tablets on Wednesdays.  Recheck in 3 weeks.

## 2010-10-12 NOTE — Progress Notes (Signed)
Summary: RX  Phone Note Refill Request Call back at Home Phone [phone] Message from:  Patient on August 24, 2010 4:41 PM  Refills Requested: Medication #1:  FUROSEMIDE 40 MG TABS one tablet once daily   Notes: SHOULD BE [facility] [address]  Initial call taken by: Harlon Flor,  August 24, 2010 4:42 PM Caller: SELF Call For: [facility]  Follow-up for Phone Call        Changed medication to furosemide 20 mg.  The last D/C summary from [facility] states furosemide 20 mg and not the furosemide 40 mg.  Patient states has taken 20 mg one tablet once daily. Follow-up by: Bishop Dublin, CMA,  August 25, 2010 8:29 AM    New/Updated Medications: FUROSEMIDE 20 MG TABS (FUROSEMIDE) one tablet once daily Prescriptions: FUROSEMIDE 20 MG TABS (FUROSEMIDE) one tablet once daily  #30 x 6   Entered by:   Bishop Dublin, CMA   Authorized by:   Dossie Arbour MD   Signed by:   Bishop Dublin, CMA on 08/25/2010   Method used:   [facility]ronically to        [facility] [phone]* (retail)       [address] [place], [place]  [address]       Ph: [phone] or [phone]       Fax: [phone]   RxID:   [id number]

## 2010-10-18 NOTE — Letter (Signed)
Summary: At Home BP Readings  At Home BP Readings   Imported By: Harlon Flor 10/13/2010 12:38:52  _____________________________________________________________________  External Attachment:    Type:   Image     Comment:   External Document  Appended Document: At Home BP Readings Preliminarily reviewed. Forwarded to MD desktop for review and signature /MES

## 2010-10-18 NOTE — Medication Information (Signed)
Summary: ROV/AMD  Anticoagulant Therapy  Managed by: Cloyde Reams, RN, BSN Referring MD: Dr Mariah Milling PCP: Lorie Phenix, M.D Supervising MD: Mariah Milling Indication 1: Atrial Fibrillation 427.31 Lab Used: [facility] [place] Site: [place] INR POC 1.7 INR RANGE 2.0-3.0  Dietary changes: no    Health status changes: no    Bleeding/hemorrhagic complications: no    Recent/future hospitalizations: no    Any changes in medication regimen? no    Recent/future dental: no  Any missed doses?: no       Is patient compliant with meds? yes       Allergies: No Known Drug Allergies  Anticoagulation Management History:      The patient is taking warfarin and comes in today for a routine follow up visit.  Positive risk factors for bleeding include an age of 37 years or older.  The bleeding index is 'intermediate risk'.  Positive CHADS2 values include History of HTN and Age > 42 years old.  Anticoagulation responsible provider: Theotis Gerdeman.  INR POC: 1.7.  Exp: 08/2011.    Anticoagulation Management Assessment/Plan:      The patient's current anticoagulation dose is Warfarin sodium 2 mg tabs: Use as directed by Anticoagualtion Clinic.  The target INR is 2.0-3.0.  The next INR is due 11/01/2010.  Anticoagulation instructions were given to patient.  Results were reviewed/authorized by Cloyde Reams, RN, BSN.  She was notified by Cloyde Reams RN.         Prior Anticoagulation Instructions: INR 2.0  Continue same dosage 2 tablets daily except 3 tablets on Wednesdays. Recheck in 4 weeks.  Current Anticoagulation Instructions: INR 1.7  Start taking 2 tablets daily except 3 tablets on Wednesdays and Saturdays.  Recheck in 3 weeks.   Prescriptions: WARFARIN SODIUM 2 MG TABS (WARFARIN SODIUM) Use as directed by Anticoagualtion Clinic  #75 x 3   Entered by:   Cloyde Reams RN   Authorized by:   Dossie Arbour MD   Signed by:   Cloyde Reams RN on 10/11/2010   Method used:   Electronically  to        [facility] [phone]* (retail)       [address] [place], [place]  [address]       Ph: [phone] or [phone]       Fax: [phone]   RxID:   [id number]

## 2010-10-18 NOTE — Letter (Signed)
Summary: At Home BP Readings  At Home BP Readings   Imported By: Harlon Flor 10/09/2010 11:58:21  _____________________________________________________________________  External Attachment:    Type:   Image     Comment:   External Document  Appended Document: At Home BP Readings Preliminarily reviewed. Forwarded to MD desktop for review and signature /MES  Appended Document: At Home BP Readings Most recent BP number have been looking great!

## 2010-11-01 ENCOUNTER — Encounter (INDEPENDENT_AMBULATORY_CARE_PROVIDER_SITE_OTHER): Payer: Medicare Other

## 2010-11-01 ENCOUNTER — Encounter: Payer: Self-pay | Admitting: Cardiovascular Disease

## 2010-11-01 DIAGNOSIS — I4891 Unspecified atrial fibrillation: Secondary | ICD-10-CM

## 2010-11-01 LAB — CONVERTED CEMR LAB: POC INR: 1.6

## 2010-11-07 NOTE — Medication Information (Signed)
Summary: Jill Ellison  Anticoagulant Therapy  Managed by: Cloyde Reams, RN, BSN Referring MD: Dr Mariah Milling PCP: Lorie Phenix, M.D Supervising MD: Mariah Milling Indication 1: Atrial Fibrillation 427.31 Lab Used: [facility] [place] Site: [place] INR POC 1.6 INR RANGE 2.0-3.0  Dietary changes: yes       Details: May have have eaten some additional vit K.   Health status changes: no    Bleeding/hemorrhagic complications: no    Recent/future hospitalizations: no    Any changes in medication regimen? no    Recent/future dental: no  Any missed doses?: yes     Details: Unsure may missed last night's dosage of Coumadin.   Is patient compliant with meds? yes       Allergies: No Known Drug Allergies  Anticoagulation Management History:      The patient is taking warfarin and comes in today for a routine follow up visit.  Positive risk factors for bleeding include an age of 11 years or older.  The bleeding index is 'intermediate risk'.  Positive CHADS2 values include History of HTN and Age > 70 years old.  Anticoagulation responsible provider: Rossetta Kama.  INR POC: 1.6.  Exp: 08/2011.    Anticoagulation Management Assessment/Plan:      The patient's current anticoagulation dose is Warfarin sodium 2 mg tabs: Use as directed by Anticoagualtion Clinic.  The target INR is 2.0-3.0.  The next INR is due 11/15/2010.  Anticoagulation instructions were given to patient.  Results were reviewed/authorized by Cloyde Reams, RN, BSN.  She was notified by Cloyde Reams RN.         Prior Anticoagulation Instructions: INR 1.7  Start taking 2 tablets daily except 3 tablets on Wednesdays and Saturdays.  Recheck in 3 weeks.    Current Anticoagulation Instructions: INR 1.6  Take 4 tablets today, then start taking 2 tablets daily except 3 tablets on Mondays, Wednesdays, and Saturdays.  Recheck in 2 weeks.

## 2010-11-15 ENCOUNTER — Encounter: Payer: Self-pay | Admitting: Internal Medicine

## 2010-11-15 ENCOUNTER — Encounter (INDEPENDENT_AMBULATORY_CARE_PROVIDER_SITE_OTHER): Payer: Medicare Other

## 2010-11-15 DIAGNOSIS — Z7901 Long term (current) use of anticoagulants: Secondary | ICD-10-CM

## 2010-11-15 DIAGNOSIS — I4891 Unspecified atrial fibrillation: Secondary | ICD-10-CM

## 2010-11-15 LAB — CONVERTED CEMR LAB: POC INR: 3

## 2010-11-21 NOTE — Medication Information (Signed)
Summary: rov/ewj  Anticoagulant Therapy  Managed by: Bethena Midget, RN, BSN Referring MD: Dr Mariah Milling PCP: Lorie Phenix, M.D Supervising MD: Gala Romney MD, Reuel Boom Indication 1: Atrial Fibrillation 427.31 Lab Used: [facility] [place] Site: [place] INR POC 3.0 INR RANGE 2.0-3.0  Dietary changes: yes       Details: Pt states she hasn't been eating any green veggies since last visit  Health status changes: no    Bleeding/hemorrhagic complications: no    Recent/future hospitalizations: no    Any changes in medication regimen? no    Recent/future dental: no  Any missed doses?: no       Is patient compliant with meds? yes       Allergies: No Known Drug Allergies  Anticoagulation Management History:      The patient is taking warfarin and comes in today for a routine follow up visit.  Positive risk factors for bleeding include an age of 32 years or older.  The bleeding index is 'intermediate risk'.  Positive CHADS2 values include History of HTN and Age > 53 years old.  Anticoagulation responsible provider: Renee Erb MD, Reuel Boom.  INR POC: 3.0.  Cuvette Lot#: [id number].  Exp: 09/2011.    Anticoagulation Management Assessment/Plan:      The patient's current anticoagulation dose is Warfarin sodium 2 mg tabs: Use as directed by Anticoagualtion Clinic.  The target INR is 2.0-3.0.  The next INR is due 11/29/2010.  Anticoagulation instructions were given to patient.  Results were reviewed/authorized by Bethena Midget, RN, BSN.  She was notified by Bethena Midget, RN, BSN.         Prior Anticoagulation Instructions: INR 1.6  Take 4 tablets today, then start taking 2 tablets daily except 3 tablets on Mondays, Wednesdays, and Saturdays.  Recheck in 2 weeks.    Current Anticoagulation Instructions: INR 3.0 Today only take 1 pill then resume 2 pills everyday except 3 pills on Mondays, Wednesdays and Saturdays. Recheck in 2 weeks.

## 2010-11-25 ENCOUNTER — Encounter: Payer: Self-pay | Admitting: Cardiovascular Disease

## 2010-11-25 DIAGNOSIS — I4891 Unspecified atrial fibrillation: Secondary | ICD-10-CM

## 2010-11-25 DIAGNOSIS — Z7901 Long term (current) use of anticoagulants: Secondary | ICD-10-CM | POA: Insufficient documentation

## 2010-11-29 ENCOUNTER — Ambulatory Visit (INDEPENDENT_AMBULATORY_CARE_PROVIDER_SITE_OTHER): Payer: Medicare Other | Admitting: Emergency Medicine

## 2010-11-29 DIAGNOSIS — Z7901 Long term (current) use of anticoagulants: Secondary | ICD-10-CM

## 2010-11-29 DIAGNOSIS — I4891 Unspecified atrial fibrillation: Secondary | ICD-10-CM

## 2010-11-29 LAB — POCT INR: INR: 1.9

## 2010-11-29 NOTE — Patient Instructions (Signed)
Take 3 tablets today and tomorrow, then resume same dosage 2 tablets daily except 3 tablets on Mondays, Wednesdays, and Saturdays.  Recheck in 3 weeks.

## 2010-12-20 ENCOUNTER — Ambulatory Visit (INDEPENDENT_AMBULATORY_CARE_PROVIDER_SITE_OTHER): Payer: Medicare Other | Admitting: Emergency Medicine

## 2010-12-20 DIAGNOSIS — I4891 Unspecified atrial fibrillation: Secondary | ICD-10-CM

## 2010-12-20 DIAGNOSIS — Z7901 Long term (current) use of anticoagulants: Secondary | ICD-10-CM

## 2010-12-20 LAB — POCT INR: INR: 2.5

## 2011-01-08 ENCOUNTER — Other Ambulatory Visit: Payer: Self-pay | Admitting: Cardiovascular Disease

## 2011-01-08 NOTE — Telephone Encounter (Signed)
rx sent into pharmacy

## 2011-01-17 ENCOUNTER — Ambulatory Visit (INDEPENDENT_AMBULATORY_CARE_PROVIDER_SITE_OTHER): Payer: Medicare Other | Admitting: Emergency Medicine

## 2011-01-17 DIAGNOSIS — Z7901 Long term (current) use of anticoagulants: Secondary | ICD-10-CM

## 2011-01-17 DIAGNOSIS — I4891 Unspecified atrial fibrillation: Secondary | ICD-10-CM

## 2011-01-17 LAB — POCT INR: INR: 2.5

## 2011-02-08 ENCOUNTER — Other Ambulatory Visit: Payer: Self-pay | Admitting: Cardiovascular Disease

## 2011-02-13 ENCOUNTER — Other Ambulatory Visit: Payer: Self-pay | Admitting: Cardiovascular Disease

## 2011-02-13 ENCOUNTER — Ambulatory Visit: Payer: Self-pay | Admitting: General Surgery

## 2011-02-14 ENCOUNTER — Ambulatory Visit (INDEPENDENT_AMBULATORY_CARE_PROVIDER_SITE_OTHER): Payer: Medicare Other | Admitting: Emergency Medicine

## 2011-02-14 DIAGNOSIS — I4891 Unspecified atrial fibrillation: Secondary | ICD-10-CM

## 2011-02-14 DIAGNOSIS — Z7901 Long term (current) use of anticoagulants: Secondary | ICD-10-CM

## 2011-02-14 LAB — POCT INR: INR: 2.5

## 2011-02-27 ENCOUNTER — Other Ambulatory Visit: Payer: Self-pay | Admitting: Emergency Medicine

## 2011-02-27 MED ORDER — POTASSIUM CHLORIDE CRYS ER 20 MEQ PO TBCR: 20.0000 meq | EXTENDED_RELEASE_TABLET | Freq: Every day | ORAL | Status: DC

## 2011-03-06 ENCOUNTER — Other Ambulatory Visit: Payer: Self-pay | Admitting: Cardiovascular Disease

## 2011-03-09 ENCOUNTER — Other Ambulatory Visit: Payer: Self-pay | Admitting: Cardiovascular Disease

## 2011-03-21 ENCOUNTER — Encounter: Payer: Self-pay | Admitting: Cardiovascular Disease

## 2011-03-21 ENCOUNTER — Ambulatory Visit (INDEPENDENT_AMBULATORY_CARE_PROVIDER_SITE_OTHER): Payer: Medicare Other | Admitting: Emergency Medicine

## 2011-03-21 DIAGNOSIS — Z7901 Long term (current) use of anticoagulants: Secondary | ICD-10-CM

## 2011-03-21 DIAGNOSIS — I4891 Unspecified atrial fibrillation: Secondary | ICD-10-CM

## 2011-03-21 LAB — POCT INR: INR: 1.9

## 2011-03-23 ENCOUNTER — Other Ambulatory Visit: Payer: Self-pay | Admitting: Cardiovascular Disease

## 2011-03-23 ENCOUNTER — Encounter: Payer: Self-pay | Admitting: Cardiovascular Disease

## 2011-03-23 ENCOUNTER — Ambulatory Visit (INDEPENDENT_AMBULATORY_CARE_PROVIDER_SITE_OTHER): Payer: Medicare Other | Admitting: Cardiovascular Disease

## 2011-03-23 DIAGNOSIS — I4891 Unspecified atrial fibrillation: Secondary | ICD-10-CM

## 2011-03-23 DIAGNOSIS — Z7901 Long term (current) use of anticoagulants: Secondary | ICD-10-CM

## 2011-03-23 DIAGNOSIS — I10 Essential (primary) hypertension: Secondary | ICD-10-CM

## 2011-03-23 NOTE — Patient Instructions (Signed)
You are doing well. No medication changes were made. Please call us if you have new issues that need to be addressed before your next appt.  We will call you for a follow up Appt. In 6 months  

## 2011-03-23 NOTE — Progress Notes (Signed)
Patient ID: Jill Ellison, female    DOB: [birth year], 77 y.o.   MRN: [id number]  HPI Comments: [name] is a very pleasant 77 year old woman with a history of chronic back pain, recent history of lumpectomy identifying breast cancer, mastectomy on the left with Dr. Doristine Counter,  diagnosed with atrial fibrillation prior to her surgery and was not started on warfarin as her surgery was pending, Presenting for routine followup. she was started on rate controlling medications including diltiazem, digoxin with Lasix. following surgery, she was started on warfarin.   Today, She denies  chest pain, shortness of breath. no significant edema in her lower extremities.She denies any periods of tachycardia and is able to ambulate without any falls and has no significant gait instability. We gave her an inhaler and she reports this helps her shortness of breath. She takes it p.r.n.[name]  She continues to have problems with her back is very active.   EKG shows atrial fibrillation with rate of in the 65 with no significant ST or T wave changes      Outpatient Encounter Prescriptions as of 03/23/2011  Medication Sig Dispense Refill  . calcium-vitamin D (OSCAL WITH D) 500-200 MG-UNIT per tablet Take 1 tablet by mouth 2 (two) times daily.        . Cholecalciferol (VITAMIN D3) 1000 UNITS CAPS Take 1 capsule by mouth daily.        . cyclobenzaprine (FLEXERIL) 5 MG tablet Take 5 mg by mouth 2 (two) times daily as needed.        . digoxin (LANOXIN) 0.125 MG tablet Take 125 mcg by mouth every other day.        . diltiazem (CARDIZEM CD) 240 MG 24 hr capsule EVERY DAY  30 capsule  6  . fexofenadine (ALLEGRA) 180 MG tablet Take 180 mg by mouth daily.        . furosemide (LASIX) 20 MG tablet TAKE 1 TABLET EVERY DAY  30 tablet  6  . HYDROcodone-acetaminophen (VICODIN) 5-500 MG per tablet Take 1 tablet by mouth every 6 (six) hours as needed.        . ibandronate (BONIVA) 150 MG tablet Take 150 mg by mouth every 30 (thirty)  days. Take in the morning with a full glass of water, on an empty stomach, and do not take anything else by mouth or lie down for the next 30 min.       . Iron Polysacch Cmplx-B12-FA (FERREX 150 FORTE) 150-0.025-1 MG CAPS Take 1 capsule by mouth 2 (two) times daily.        [name] letrozole (FEMARA) 2.5 MG tablet Take 2.5 mg by mouth daily.        [name] levalbuterol (XOPENEX HFA) 45 MCG/ACT inhaler Inhale 1-2 puffs into the lungs every 4 (four) hours as needed.        [name] lisinopril (PRINIVIL,ZESTRIL) 20 MG tablet TAKE 1 TABLET BY MOUTH DAILY  30 tablet  6  . Magnesium 250 MG TABS Take 1 tablet by mouth daily.        . Multiple Vitamin (MULTIVITAMIN) tablet Take 1 tablet by mouth daily.        . potassium chloride SA (K-DUR,KLOR-CON) 20 MEQ tablet Take 1 tablet (20 mEq total) by mouth daily.  30 tablet  6  . warfarin (COUMADIN) 2 MG tablet USE AS DIRECTED BY ANTICOAGUALTION CLINIC  75 tablet  3     Review of Systems  Constitutional: Negative.   HENT: Negative.   Eyes: Negative.  Respiratory: Negative.   Cardiovascular: Negative.   Gastrointestinal: Negative.   Musculoskeletal: Positive for back pain.  Skin: Negative.   Neurological: Negative.   Hematological: Negative.   Psychiatric/Behavioral: Negative.   All other systems reviewed and are negative.    BP 135/75  Pulse 79  Ht 5\' 1"  (1.549 m)  Wt 146 lb (66.225 kg)  BMI 27.59 kg/m2  Physical Exam  Nursing note and vitals reviewed. Constitutional: She is oriented to person, place, and time. She appears well-developed and well-nourished.  HENT:  Head: Normocephalic.  Nose: Nose normal.  Mouth/Throat: Oropharynx is clear and moist.  Eyes: Conjunctivae are normal. Pupils are equal, round, and reactive to light.  Neck: Normal range of motion. Neck supple. No JVD present.  Cardiovascular: Normal rate, S1 normal, S2 normal and intact distal pulses.  An irregularly irregular rhythm present. Exam reveals no gallop and no friction rub.     Murmur heard.  Crescendo systolic murmur is present with a grade of 1/6  Pulmonary/Chest: Effort normal and breath sounds normal. No respiratory distress. She has no wheezes. She has no rales. She exhibits no tenderness.  Abdominal: Soft. Bowel sounds are normal. She exhibits no distension. There is no tenderness.  Musculoskeletal: Normal range of motion. She exhibits no edema and no tenderness.  Lymphadenopathy:    She has no cervical adenopathy.  Neurological: She is alert and oriented to person, place, and time. Coordination normal.  Skin: Skin is warm and dry. No rash noted. No erythema.  Psychiatric: She has a normal mood and affect. Her behavior is normal. Judgment and thought content normal.         Assessment and Plan

## 2011-03-24 DIAGNOSIS — I10 Essential (primary) hypertension: Secondary | ICD-10-CM | POA: Insufficient documentation

## 2011-03-24 NOTE — Assessment & Plan Note (Signed)
Rate is well-controlled on her current medications. No further changes were made. She is on warfarin. As she is asymptomatic, no further discussion of cardioversion.

## 2011-03-24 NOTE — Assessment & Plan Note (Signed)
Blood pressure is well controlled on today's visit. No changes made to the medications. 

## 2011-03-24 NOTE — Assessment & Plan Note (Signed)
Despite her age, her gait is steady. We'll continue anticoagulation.

## 2011-04-18 ENCOUNTER — Ambulatory Visit (INDEPENDENT_AMBULATORY_CARE_PROVIDER_SITE_OTHER): Payer: Medicare Other | Admitting: Emergency Medicine

## 2011-04-18 DIAGNOSIS — I4891 Unspecified atrial fibrillation: Secondary | ICD-10-CM

## 2011-04-18 DIAGNOSIS — Z7901 Long term (current) use of anticoagulants: Secondary | ICD-10-CM

## 2011-04-18 LAB — POCT INR: INR: 1.8

## 2011-04-18 MED ORDER — WARFARIN SODIUM 2 MG PO TABS: ORAL_TABLET | ORAL | Status: DC

## 2011-04-24 ENCOUNTER — Other Ambulatory Visit: Payer: Self-pay | Admitting: Cardiovascular Disease

## 2011-04-25 ENCOUNTER — Telehealth: Payer: Self-pay

## 2011-04-25 MED ORDER — DIGOXIN 125 MCG PO TABS: 125.0000 ug | ORAL_TABLET | ORAL | Status: DC

## 2011-04-25 NOTE — Telephone Encounter (Signed)
Refill requested digoxin.

## 2011-05-09 ENCOUNTER — Ambulatory Visit (INDEPENDENT_AMBULATORY_CARE_PROVIDER_SITE_OTHER): Payer: Medicare Other | Admitting: Emergency Medicine

## 2011-05-09 DIAGNOSIS — I4891 Unspecified atrial fibrillation: Secondary | ICD-10-CM

## 2011-05-09 DIAGNOSIS — Z7901 Long term (current) use of anticoagulants: Secondary | ICD-10-CM

## 2011-05-30 ENCOUNTER — Ambulatory Visit (INDEPENDENT_AMBULATORY_CARE_PROVIDER_SITE_OTHER): Payer: Medicare Other | Admitting: Emergency Medicine

## 2011-05-30 DIAGNOSIS — I4891 Unspecified atrial fibrillation: Secondary | ICD-10-CM

## 2011-05-30 DIAGNOSIS — Z7901 Long term (current) use of anticoagulants: Secondary | ICD-10-CM

## 2011-06-27 ENCOUNTER — Ambulatory Visit (INDEPENDENT_AMBULATORY_CARE_PROVIDER_SITE_OTHER): Payer: Medicare Other | Admitting: Emergency Medicine

## 2011-06-27 DIAGNOSIS — I4891 Unspecified atrial fibrillation: Secondary | ICD-10-CM

## 2011-06-27 DIAGNOSIS — Z7901 Long term (current) use of anticoagulants: Secondary | ICD-10-CM

## 2011-06-27 LAB — POCT INR: INR: 1.5

## 2011-07-11 ENCOUNTER — Ambulatory Visit (INDEPENDENT_AMBULATORY_CARE_PROVIDER_SITE_OTHER): Payer: Medicare Other | Admitting: Emergency Medicine

## 2011-07-11 DIAGNOSIS — I4891 Unspecified atrial fibrillation: Secondary | ICD-10-CM

## 2011-07-11 DIAGNOSIS — Z7901 Long term (current) use of anticoagulants: Secondary | ICD-10-CM

## 2011-07-11 LAB — POCT INR: INR: 1.6

## 2011-07-25 ENCOUNTER — Ambulatory Visit (INDEPENDENT_AMBULATORY_CARE_PROVIDER_SITE_OTHER): Payer: Medicare Other | Admitting: Emergency Medicine

## 2011-07-25 DIAGNOSIS — I4891 Unspecified atrial fibrillation: Secondary | ICD-10-CM

## 2011-07-25 DIAGNOSIS — Z7901 Long term (current) use of anticoagulants: Secondary | ICD-10-CM

## 2011-08-08 ENCOUNTER — Ambulatory Visit (INDEPENDENT_AMBULATORY_CARE_PROVIDER_SITE_OTHER): Payer: Medicare Other | Admitting: Emergency Medicine

## 2011-08-08 DIAGNOSIS — I4891 Unspecified atrial fibrillation: Secondary | ICD-10-CM

## 2011-08-08 DIAGNOSIS — Z7901 Long term (current) use of anticoagulants: Secondary | ICD-10-CM

## 2011-08-08 LAB — POCT INR: INR: 3

## 2011-08-17 ENCOUNTER — Other Ambulatory Visit: Payer: Self-pay | Admitting: Cardiovascular Disease

## 2011-08-17 NOTE — Telephone Encounter (Signed)
Refill sent for lisinopril  

## 2011-08-29 ENCOUNTER — Ambulatory Visit (INDEPENDENT_AMBULATORY_CARE_PROVIDER_SITE_OTHER): Payer: Medicare Other | Admitting: Emergency Medicine

## 2011-08-29 DIAGNOSIS — Z7901 Long term (current) use of anticoagulants: Secondary | ICD-10-CM

## 2011-08-29 DIAGNOSIS — I4891 Unspecified atrial fibrillation: Secondary | ICD-10-CM

## 2011-08-29 LAB — POCT INR: INR: 1.4

## 2011-09-12 ENCOUNTER — Ambulatory Visit (INDEPENDENT_AMBULATORY_CARE_PROVIDER_SITE_OTHER): Payer: Medicare Other | Admitting: Emergency Medicine

## 2011-09-12 DIAGNOSIS — I4891 Unspecified atrial fibrillation: Secondary | ICD-10-CM

## 2011-09-12 DIAGNOSIS — Z7901 Long term (current) use of anticoagulants: Secondary | ICD-10-CM

## 2011-09-21 ENCOUNTER — Other Ambulatory Visit: Payer: Self-pay | Admitting: Cardiovascular Disease

## 2011-09-24 ENCOUNTER — Telehealth: Payer: Self-pay

## 2011-09-24 MED ORDER — WARFARIN SODIUM 2 MG PO TABS: ORAL_TABLET | ORAL | Status: DC

## 2011-09-24 NOTE — Telephone Encounter (Signed)
Refill sent for warfarin  

## 2011-09-26 ENCOUNTER — Ambulatory Visit: Payer: Medicare Other | Admitting: Cardiovascular Disease

## 2011-10-02 ENCOUNTER — Other Ambulatory Visit: Payer: Self-pay | Admitting: Cardiovascular Disease

## 2011-10-02 ENCOUNTER — Other Ambulatory Visit: Payer: Self-pay

## 2011-10-02 MED ORDER — POTASSIUM CHLORIDE CRYS ER 20 MEQ PO TBCR: 20.0000 meq | EXTENDED_RELEASE_TABLET | Freq: Every day | ORAL | Status: DC

## 2011-10-03 ENCOUNTER — Ambulatory Visit (INDEPENDENT_AMBULATORY_CARE_PROVIDER_SITE_OTHER): Payer: Medicare Other | Admitting: Emergency Medicine

## 2011-10-03 DIAGNOSIS — Z7901 Long term (current) use of anticoagulants: Secondary | ICD-10-CM

## 2011-10-03 DIAGNOSIS — I4891 Unspecified atrial fibrillation: Secondary | ICD-10-CM

## 2011-10-03 LAB — POCT INR: INR: 2.3

## 2011-10-05 ENCOUNTER — Ambulatory Visit (INDEPENDENT_AMBULATORY_CARE_PROVIDER_SITE_OTHER): Payer: Medicare Other | Admitting: Cardiovascular Disease

## 2011-10-05 ENCOUNTER — Encounter: Payer: Self-pay | Admitting: Cardiovascular Disease

## 2011-10-05 VITALS — BP 154/89 | HR 74 | Ht 61.0 in | Wt 144.5 lb

## 2011-10-05 DIAGNOSIS — I4891 Unspecified atrial fibrillation: Secondary | ICD-10-CM

## 2011-10-05 DIAGNOSIS — I10 Essential (primary) hypertension: Secondary | ICD-10-CM

## 2011-10-05 DIAGNOSIS — Z79899 Other long term (current) drug therapy: Secondary | ICD-10-CM

## 2011-10-05 DIAGNOSIS — Z7901 Long term (current) use of anticoagulants: Secondary | ICD-10-CM

## 2011-10-05 NOTE — Assessment & Plan Note (Signed)
Blood pressure is mildly elevated. We have asked her to monitor her blood pressure at home and call our office if he continues to be high.

## 2011-10-05 NOTE — Assessment & Plan Note (Signed)
She reports that she is comfortable and again does not want to consider cardioversion.  She has no complaints. Rate is well controlled and we have not changed any medications.

## 2011-10-05 NOTE — Patient Instructions (Signed)
You are doing well. No medication changes were made.  Please call us if you have new issues that need to be addressed before your next appt.  Your physician wants you to follow-up in: 6 months.  You will receive a reminder letter in the mail two months in advance. If you don't receive a letter, please call our office to schedule the follow-up appointment.   

## 2011-10-05 NOTE — Progress Notes (Signed)
Patient ID: Jill Ellison, female    DOB: [birth year], 78 y.o.   MRN: [id number]  HPI Comments: Jill Ellison is a very pleasant 78 year old woman with a history of chronic back pain, recent history of lumpectomy identifying breast cancer, mastectomy on the left with Dr. Doristine Counter,  diagnosed with atrial fibrillation prior to her surgery and was not started on warfarin as her surgery was pending, started on rate controlling medications including diltiazem, digoxin with Lasix. following surgery, she was started on warfarin. She presents for routine followup   She denies chest pain, shortness of breath. no significant edema in her lower extremities.She denies any periods of tachycardia and is able to ambulate without any falls and has no significant gait instability. She continues to have problems with her back though is very active.   EKG shows atrial fibrillation with rate of 81 with nonspecific ST abnormality in V4 through V6      Outpatient Encounter Prescriptions as of 10/05/2011  Medication Sig Dispense Refill  . calcium-vitamin D (OSCAL WITH D) 500-200 MG-UNIT per tablet Take 1 tablet by mouth 2 (two) times daily.        . Cholecalciferol (VITAMIN D3) 1000 UNITS CAPS Take 1 capsule by mouth daily.        . cyclobenzaprine (FLEXERIL) 5 MG tablet Take 5 mg by mouth 2 (two) times daily as needed.        . digoxin (LANOXIN) 0.125 MG tablet Take 1 tablet (125 mcg total) by mouth every other day.  30 tablet  6  . diltiazem (CARDIZEM CD) 240 MG 24 hr capsule EVERY DAY  30 capsule  6  . fexofenadine (ALLEGRA) 180 MG tablet Take 180 mg by mouth daily.        . furosemide (LASIX) 20 MG tablet TAKE 1 TABLET EVERY DAY  30 tablet  6  . HYDROcodone-acetaminophen (VICODIN) 5-500 MG per tablet Take 1 tablet by mouth every 6 (six) hours as needed.        . ibandronate (BONIVA) 150 MG tablet Take 150 mg by mouth every 30 (thirty) days. Take in the morning with a full glass of water, on an empty stomach, and do  not take anything else by mouth or lie down for the next 30 min.       . Iron Polysacch Cmplx-B12-FA (FERREX 150 FORTE) 150-0.025-1 MG CAPS Take 1 capsule by mouth 2 (two) times daily.        [name] letrozole (FEMARA) 2.5 MG tablet Take 2.5 mg by mouth daily.        [name] levalbuterol (XOPENEX HFA) 45 MCG/ACT inhaler Inhale 1-2 puffs into the lungs every 4 (four) hours as needed.        [name] lisinopril (PRINIVIL,ZESTRIL) 20 MG tablet TAKE 1 TABLET BY MOUTH DAILY  30 tablet  6  . Magnesium 250 MG TABS Take 1 tablet by mouth daily.        . Multiple Vitamin (MULTIVITAMIN) tablet Take 1 tablet by mouth daily.        . potassium chloride SA (K-DUR,KLOR-CON) 20 MEQ tablet Take 1 tablet (20 mEq total) by mouth daily.  30 tablet  3  . warfarin (COUMADIN) 2 MG tablet Take as directed by Anticoagulation clinic  90 tablet  3     Review of Systems  Constitutional: Negative.   HENT: Negative.   Eyes: Negative.   Respiratory: Negative.   Cardiovascular: Negative.   Gastrointestinal: Negative.   Musculoskeletal: Positive for back pain.  Skin:  Negative.   Neurological: Negative.   Hematological: Negative.   Psychiatric/Behavioral: Negative.   All other systems reviewed and are negative.    BP 154/89  Pulse 74  Ht 5\' 1"  (1.549 m)  Wt 144 lb 8 oz (65.545 kg)  BMI 27.30 kg/m2  Physical Exam  Nursing note and vitals reviewed. Constitutional: She is oriented to person, place, and time. She appears well-developed and well-nourished.  HENT:  Head: Normocephalic.  Nose: Nose normal.  Mouth/Throat: Oropharynx is clear and moist.  Eyes: Conjunctivae are normal. Pupils are equal, round, and reactive to light.  Neck: Normal range of motion. Neck supple. No JVD present.  Cardiovascular: Normal rate, S1 normal, S2 normal and intact distal pulses.  An irregularly irregular rhythm present. Exam reveals no gallop and no friction rub.   Murmur heard.  Crescendo systolic murmur is present with a grade of 1/6    Pulmonary/Chest: Effort normal and breath sounds normal. No respiratory distress. She has no wheezes. She has no rales. She exhibits no tenderness.  Abdominal: Soft. Bowel sounds are normal. She exhibits no distension. There is no tenderness.  Musculoskeletal: Normal range of motion. She exhibits no edema and no tenderness.  Lymphadenopathy:    She has no cervical adenopathy.  Neurological: She is alert and oriented to person, place, and time. Coordination normal.  Skin: Skin is warm and dry. No rash noted. No erythema.  Psychiatric: She has a normal mood and affect. Her behavior is normal. Judgment and thought content normal.         Assessment and Plan

## 2011-10-05 NOTE — Assessment & Plan Note (Signed)
She is tolerating anticoagulation well. Good gait and balance.

## 2011-10-06 LAB — BASIC METABOLIC PANEL
CO2: 27 mmol/L (ref 20–32)
Calcium: 8.6 mg/dL (ref 8.6–10.2)
Chloride: 99 mmol/L (ref 97–108)
Glucose: 84 mg/dL (ref 65–99)
Potassium: 4.7 mmol/L (ref 3.5–5.2)
Sodium: 139 mmol/L (ref 134–144)

## 2011-10-06 LAB — DIGOXIN LEVEL: Digoxin Level: 0.9 ng/mL (ref 0.9–2.0)

## 2011-10-18 ENCOUNTER — Other Ambulatory Visit: Payer: Self-pay | Admitting: Cardiovascular Disease

## 2011-10-31 ENCOUNTER — Ambulatory Visit (INDEPENDENT_AMBULATORY_CARE_PROVIDER_SITE_OTHER): Payer: Medicare Other | Admitting: *Deleted

## 2011-10-31 DIAGNOSIS — I4891 Unspecified atrial fibrillation: Secondary | ICD-10-CM

## 2011-10-31 DIAGNOSIS — Z7901 Long term (current) use of anticoagulants: Secondary | ICD-10-CM

## 2011-11-07 ENCOUNTER — Ambulatory Visit (INDEPENDENT_AMBULATORY_CARE_PROVIDER_SITE_OTHER): Payer: Medicare Other

## 2011-11-07 DIAGNOSIS — Z7901 Long term (current) use of anticoagulants: Secondary | ICD-10-CM

## 2011-11-07 DIAGNOSIS — I4891 Unspecified atrial fibrillation: Secondary | ICD-10-CM

## 2011-11-13 ENCOUNTER — Other Ambulatory Visit: Payer: Self-pay | Admitting: Cardiovascular Disease

## 2011-11-14 ENCOUNTER — Ambulatory Visit (INDEPENDENT_AMBULATORY_CARE_PROVIDER_SITE_OTHER): Payer: Medicare Other

## 2011-11-14 DIAGNOSIS — I4891 Unspecified atrial fibrillation: Secondary | ICD-10-CM

## 2011-11-14 DIAGNOSIS — Z7901 Long term (current) use of anticoagulants: Secondary | ICD-10-CM

## 2011-11-21 ENCOUNTER — Ambulatory Visit (INDEPENDENT_AMBULATORY_CARE_PROVIDER_SITE_OTHER): Payer: Medicare Other

## 2011-11-21 DIAGNOSIS — Z7901 Long term (current) use of anticoagulants: Secondary | ICD-10-CM

## 2011-11-21 DIAGNOSIS — I4891 Unspecified atrial fibrillation: Secondary | ICD-10-CM

## 2011-11-28 ENCOUNTER — Ambulatory Visit (INDEPENDENT_AMBULATORY_CARE_PROVIDER_SITE_OTHER): Payer: Medicare Other

## 2011-11-28 ENCOUNTER — Other Ambulatory Visit: Payer: Self-pay | Admitting: Cardiovascular Disease

## 2011-11-28 DIAGNOSIS — Z7901 Long term (current) use of anticoagulants: Secondary | ICD-10-CM

## 2011-11-28 DIAGNOSIS — I4891 Unspecified atrial fibrillation: Secondary | ICD-10-CM

## 2011-11-28 LAB — POCT INR: INR: 1.2

## 2011-12-05 ENCOUNTER — Ambulatory Visit (INDEPENDENT_AMBULATORY_CARE_PROVIDER_SITE_OTHER): Payer: Medicare Other

## 2011-12-05 DIAGNOSIS — I4891 Unspecified atrial fibrillation: Secondary | ICD-10-CM

## 2011-12-05 DIAGNOSIS — Z7901 Long term (current) use of anticoagulants: Secondary | ICD-10-CM

## 2011-12-05 LAB — POCT INR: INR: 1.2

## 2011-12-12 ENCOUNTER — Ambulatory Visit (INDEPENDENT_AMBULATORY_CARE_PROVIDER_SITE_OTHER): Payer: Medicare Other

## 2011-12-12 DIAGNOSIS — I4891 Unspecified atrial fibrillation: Secondary | ICD-10-CM

## 2011-12-12 DIAGNOSIS — Z7901 Long term (current) use of anticoagulants: Secondary | ICD-10-CM

## 2011-12-19 ENCOUNTER — Ambulatory Visit (INDEPENDENT_AMBULATORY_CARE_PROVIDER_SITE_OTHER): Payer: Medicare Other

## 2011-12-19 DIAGNOSIS — Z7901 Long term (current) use of anticoagulants: Secondary | ICD-10-CM

## 2011-12-19 DIAGNOSIS — I4891 Unspecified atrial fibrillation: Secondary | ICD-10-CM

## 2011-12-26 ENCOUNTER — Ambulatory Visit (INDEPENDENT_AMBULATORY_CARE_PROVIDER_SITE_OTHER): Payer: Medicare Other

## 2011-12-26 DIAGNOSIS — Z7901 Long term (current) use of anticoagulants: Secondary | ICD-10-CM

## 2011-12-26 DIAGNOSIS — I4891 Unspecified atrial fibrillation: Secondary | ICD-10-CM

## 2011-12-26 LAB — POCT INR: INR: 2.6

## 2012-01-02 ENCOUNTER — Ambulatory Visit (INDEPENDENT_AMBULATORY_CARE_PROVIDER_SITE_OTHER): Payer: Medicare Other

## 2012-01-02 DIAGNOSIS — Z7901 Long term (current) use of anticoagulants: Secondary | ICD-10-CM

## 2012-01-02 DIAGNOSIS — I4891 Unspecified atrial fibrillation: Secondary | ICD-10-CM

## 2012-01-09 ENCOUNTER — Ambulatory Visit (INDEPENDENT_AMBULATORY_CARE_PROVIDER_SITE_OTHER): Payer: Medicare Other

## 2012-01-09 DIAGNOSIS — I4891 Unspecified atrial fibrillation: Secondary | ICD-10-CM

## 2012-01-09 DIAGNOSIS — Z7901 Long term (current) use of anticoagulants: Secondary | ICD-10-CM

## 2012-01-23 ENCOUNTER — Ambulatory Visit (INDEPENDENT_AMBULATORY_CARE_PROVIDER_SITE_OTHER): Payer: Medicare Other

## 2012-01-23 DIAGNOSIS — I4891 Unspecified atrial fibrillation: Secondary | ICD-10-CM

## 2012-01-23 DIAGNOSIS — Z7901 Long term (current) use of anticoagulants: Secondary | ICD-10-CM

## 2012-01-23 LAB — POCT INR: INR: 3.6

## 2012-02-06 ENCOUNTER — Ambulatory Visit (INDEPENDENT_AMBULATORY_CARE_PROVIDER_SITE_OTHER): Payer: Medicare Other

## 2012-02-06 DIAGNOSIS — Z7901 Long term (current) use of anticoagulants: Secondary | ICD-10-CM

## 2012-02-06 DIAGNOSIS — I4891 Unspecified atrial fibrillation: Secondary | ICD-10-CM

## 2012-02-14 ENCOUNTER — Ambulatory Visit: Payer: Self-pay | Admitting: General Surgery

## 2012-02-26 ENCOUNTER — Other Ambulatory Visit: Payer: Self-pay | Admitting: Cardiovascular Disease

## 2012-02-27 ENCOUNTER — Ambulatory Visit (INDEPENDENT_AMBULATORY_CARE_PROVIDER_SITE_OTHER): Payer: Medicare Other

## 2012-02-27 DIAGNOSIS — Z7901 Long term (current) use of anticoagulants: Secondary | ICD-10-CM

## 2012-02-27 DIAGNOSIS — I4891 Unspecified atrial fibrillation: Secondary | ICD-10-CM

## 2012-02-27 LAB — POCT INR: INR: 2.3

## 2012-03-10 ENCOUNTER — Other Ambulatory Visit: Payer: Self-pay | Admitting: Cardiovascular Disease

## 2012-03-10 NOTE — Telephone Encounter (Signed)
Refilled Diltiazem. 

## 2012-03-26 ENCOUNTER — Ambulatory Visit (INDEPENDENT_AMBULATORY_CARE_PROVIDER_SITE_OTHER): Payer: Medicare Other

## 2012-03-26 DIAGNOSIS — Z7901 Long term (current) use of anticoagulants: Secondary | ICD-10-CM

## 2012-03-26 DIAGNOSIS — I4891 Unspecified atrial fibrillation: Secondary | ICD-10-CM

## 2012-03-26 LAB — POCT INR: INR: 2.4

## 2012-03-30 ENCOUNTER — Other Ambulatory Visit: Payer: Self-pay | Admitting: Cardiovascular Disease

## 2012-03-31 ENCOUNTER — Other Ambulatory Visit: Payer: Self-pay | Admitting: *Deleted

## 2012-03-31 ENCOUNTER — Other Ambulatory Visit: Payer: Self-pay

## 2012-03-31 MED ORDER — LISINOPRIL 20 MG PO TABS: 20.0000 mg | ORAL_TABLET | Freq: Every day | ORAL | Status: DC

## 2012-03-31 NOTE — Telephone Encounter (Signed)
Refill sent for lisinopril  

## 2012-03-31 NOTE — Telephone Encounter (Signed)
Refilled Lisinopril. 

## 2012-04-02 ENCOUNTER — Encounter: Payer: Self-pay | Admitting: Cardiovascular Disease

## 2012-04-02 ENCOUNTER — Ambulatory Visit (INDEPENDENT_AMBULATORY_CARE_PROVIDER_SITE_OTHER): Payer: Medicare Other | Admitting: Cardiovascular Disease

## 2012-04-02 VITALS — BP 130/70 | HR 80 | Ht 62.0 in | Wt 145.5 lb

## 2012-04-02 DIAGNOSIS — R002 Palpitations: Secondary | ICD-10-CM

## 2012-04-02 DIAGNOSIS — Z7901 Long term (current) use of anticoagulants: Secondary | ICD-10-CM

## 2012-04-02 DIAGNOSIS — I4891 Unspecified atrial fibrillation: Secondary | ICD-10-CM

## 2012-04-02 DIAGNOSIS — I10 Essential (primary) hypertension: Secondary | ICD-10-CM

## 2012-04-02 NOTE — Assessment & Plan Note (Signed)
Heart rate is well controlled. She is on anticoagulation. No further workup at this time. She does not want cardioversion.

## 2012-04-02 NOTE — Assessment & Plan Note (Signed)
Blood pressure is well controlled on today's visit. No changes made to the medications. 

## 2012-04-02 NOTE — Patient Instructions (Addendum)
You are doing well. No medication changes were made.  Please call us if you have new issues that need to be addressed before your next appt.  Your physician wants you to follow-up in: 6 months.  You will receive a reminder letter in the mail two months in advance. If you don't receive a letter, please call our office to schedule the follow-up appointment.   

## 2012-04-02 NOTE — Assessment & Plan Note (Signed)
Risks and benefits were discussed with her. No recent falls.

## 2012-04-02 NOTE — Progress Notes (Signed)
Patient ID: Jill Ellison, female    DOB: [birth year], 78 y.o.   MRN: [id number]  HPI Comments: Jill Ellison is a very pleasant 78 year old woman with a history of chronic back pain, recent history of lumpectomy identifying breast cancer, mastectomy on the left with Dr. Doristine Counter,  diagnosed with atrial fibrillation prior to her surgery and was not started on warfarin as her surgery was pending, started on rate controlling medications including diltiazem, digoxin with Lasix. following surgery, she was started on warfarin. She presents for routine followup. She has refused cardioversion in the past as she feels well.    She denies chest pain, shortness of breath. no significant edema in her lower extremities.She denies any periods of tachycardia and is able to ambulate without any falls and has no significant gait instability. She continues to have problems with her back though is very active. No new complaints   EKG shows atrial fibrillation with rate of 71 with nonspecific ST abnormality in V4 through V6      Outpatient Encounter Prescriptions as of 76/24/2013  Medication Sig Dispense Refill  . calcium-vitamin D (OSCAL WITH D) 500-200 MG-UNIT per tablet Take 1 tablet by mouth 2 (two) times daily.        . Cholecalciferol (VITAMIN D3) 1000 UNITS CAPS Take 1 capsule by mouth daily.        . cyclobenzaprine (FLEXERIL) 5 MG tablet Take 5 mg by mouth 2 (two) times daily as needed.        . digoxin (LANOXIN) 0.125 MG tablet Take 1 tablet (125 mcg total) by mouth every other day.  30 tablet  6  . diltiazem (CARDIZEM CD) 240 MG 24 hr capsule Take 1 capsule (240 mg total) by mouth daily.  30 capsule  3  . fexofenadine (ALLEGRA) 180 MG tablet Take 180 mg by mouth daily.        . furosemide (LASIX) 20 MG tablet TAKE 1 TABLET EVERY DAY  30 tablet  6  . HYDROcodone-acetaminophen (VICODIN) 5-500 MG per tablet Take 1 tablet by mouth every 6 (six) hours as needed.        [name] KLOR-CON M20 20 MEQ tablet TAKE 1  TABLET BY MOUTH DAILY  30 tablet  3  . letrozole (FEMARA) 2.5 MG tablet Take 2.5 mg by mouth daily.        [name] levalbuterol (XOPENEX HFA) 45 MCG/ACT inhaler Inhale 1-2 puffs into the lungs every 4 (four) hours as needed.        [name] lisinopril (PRINIVIL,ZESTRIL) 20 MG tablet Take 1 tablet (20 mg total) by mouth daily.  30 tablet  5  . Magnesium 250 MG TABS Take 2 tablets by mouth daily.       . Multiple Vitamin (MULTIVITAMIN) tablet Take 1 tablet by mouth daily.        [name] warfarin (COUMADIN) 2 MG tablet TAKE AS DIRECTED BY ANTICOAGULATION CLINIC  90 tablet  3  . DISCONTD: diltiazem (CARDIZEM CD) 240 MG 24 hr capsule TAKE 1 CAPSULE (240 MG TOTAL) BY MOUTH DAILY.  30 capsule  6  . DISCONTD: KLOR-CON M20 20 MEQ tablet TAKE 1 TABLET EVERY DAY  30 tablet  6     Review of Systems  Constitutional: Negative.   HENT: Negative.   Eyes: Negative.   Respiratory: Negative.   Cardiovascular: Negative.   Gastrointestinal: Negative.   Musculoskeletal: Positive for back pain.  Skin: Negative.   Neurological: Negative.   Hematological: Negative.   Psychiatric/Behavioral: Negative.  All other systems reviewed and are negative.    BP 130/70  Pulse 80  Ht 5\' 2"  (1.575 m)  Wt 145 lb 8 oz (65.998 kg)  BMI 26.61 kg/m2  Physical Exam  Nursing note and vitals reviewed. Constitutional: She is oriented to person, place, and time. She appears well-developed and well-nourished.  HENT:  Head: Normocephalic.  Nose: Nose normal.  Mouth/Throat: Oropharynx is clear and moist.  Eyes: Conjunctivae are normal. Pupils are equal, round, and reactive to light.  Neck: Normal range of motion. Neck supple. No JVD present.  Cardiovascular: Normal rate, S1 normal, S2 normal and intact distal pulses.  An irregularly irregular rhythm present. Exam reveals no gallop and no friction rub.   Murmur heard.  Crescendo systolic murmur is present with a grade of 1/6  Pulmonary/Chest: Effort normal and breath sounds normal. No  respiratory distress. She has no wheezes. She has no rales. She exhibits no tenderness.  Abdominal: Soft. Bowel sounds are normal. She exhibits no distension. There is no tenderness.  Musculoskeletal: Normal range of motion. She exhibits no edema and no tenderness.  Lymphadenopathy:    She has no cervical adenopathy.  Neurological: She is alert and oriented to person, place, and time. Coordination normal.  Skin: Skin is warm and dry. No rash noted. No erythema.  Psychiatric: She has a normal mood and affect. Her behavior is normal. Judgment and thought content normal.         Assessment and Plan

## 2012-04-09 ENCOUNTER — Other Ambulatory Visit: Payer: Self-pay | Admitting: Cardiovascular Disease

## 2012-04-10 ENCOUNTER — Other Ambulatory Visit: Payer: Self-pay | Admitting: *Deleted

## 2012-04-10 MED ORDER — LISINOPRIL 20 MG PO TABS: 20.0000 mg | ORAL_TABLET | Freq: Every day | ORAL | Status: DC

## 2012-04-10 NOTE — Telephone Encounter (Signed)
Refilled Lisinopril. 

## 2012-04-23 ENCOUNTER — Ambulatory Visit (INDEPENDENT_AMBULATORY_CARE_PROVIDER_SITE_OTHER): Payer: Medicare Other

## 2012-04-23 DIAGNOSIS — I4891 Unspecified atrial fibrillation: Secondary | ICD-10-CM

## 2012-04-23 DIAGNOSIS — Z7901 Long term (current) use of anticoagulants: Secondary | ICD-10-CM

## 2012-04-25 ENCOUNTER — Other Ambulatory Visit: Payer: Self-pay | Admitting: Cardiovascular Disease

## 2012-04-25 NOTE — Telephone Encounter (Signed)
Refilled Furosemide. 

## 2012-05-05 ENCOUNTER — Other Ambulatory Visit: Payer: Self-pay | Admitting: Cardiovascular Disease

## 2012-05-05 NOTE — Telephone Encounter (Signed)
Please review for refill.  

## 2012-05-09 NOTE — Telephone Encounter (Signed)
Please review

## 2012-05-21 ENCOUNTER — Ambulatory Visit (INDEPENDENT_AMBULATORY_CARE_PROVIDER_SITE_OTHER): Payer: Medicare Other

## 2012-05-21 DIAGNOSIS — Z7901 Long term (current) use of anticoagulants: Secondary | ICD-10-CM

## 2012-05-21 DIAGNOSIS — I4891 Unspecified atrial fibrillation: Secondary | ICD-10-CM

## 2012-06-25 ENCOUNTER — Ambulatory Visit (INDEPENDENT_AMBULATORY_CARE_PROVIDER_SITE_OTHER): Payer: Medicare Other

## 2012-06-25 DIAGNOSIS — I4891 Unspecified atrial fibrillation: Secondary | ICD-10-CM

## 2012-06-25 DIAGNOSIS — Z7901 Long term (current) use of anticoagulants: Secondary | ICD-10-CM

## 2012-07-10 ENCOUNTER — Other Ambulatory Visit: Payer: Self-pay | Admitting: Cardiovascular Disease

## 2012-07-11 ENCOUNTER — Other Ambulatory Visit: Payer: Self-pay | Admitting: *Deleted

## 2012-07-11 MED ORDER — DILTIAZEM HCL ER COATED BEADS 240 MG PO CP24: 240.0000 mg | ORAL_CAPSULE | Freq: Every day | ORAL | Status: DC

## 2012-07-11 MED ORDER — DIGOXIN 125 MCG PO TABS: 125.0000 ug | ORAL_TABLET | ORAL | Status: DC

## 2012-07-11 NOTE — Telephone Encounter (Signed)
Refilled Digoxin and Diltiazem.

## 2012-07-23 ENCOUNTER — Ambulatory Visit (INDEPENDENT_AMBULATORY_CARE_PROVIDER_SITE_OTHER): Payer: Medicare Other

## 2012-07-23 DIAGNOSIS — I4891 Unspecified atrial fibrillation: Secondary | ICD-10-CM

## 2012-07-23 DIAGNOSIS — Z7901 Long term (current) use of anticoagulants: Secondary | ICD-10-CM

## 2012-07-23 LAB — POCT INR: INR: 2.2

## 2012-07-24 ENCOUNTER — Other Ambulatory Visit: Payer: Self-pay | Admitting: Cardiovascular Disease

## 2012-07-25 ENCOUNTER — Other Ambulatory Visit: Payer: Self-pay | Admitting: *Deleted

## 2012-07-25 ENCOUNTER — Other Ambulatory Visit: Payer: Self-pay | Admitting: Cardiovascular Disease

## 2012-07-25 MED ORDER — POTASSIUM CHLORIDE CRYS ER 20 MEQ PO TBCR: 20.0000 meq | EXTENDED_RELEASE_TABLET | Freq: Every day | ORAL | Status: DC

## 2012-07-25 NOTE — Telephone Encounter (Signed)
Refilled Klor-Con 

## 2012-07-25 NOTE — Telephone Encounter (Signed)
Please review and refill, Thank You. 

## 2012-07-28 NOTE — Telephone Encounter (Signed)
Please review and refill, Thank You. 

## 2012-08-20 ENCOUNTER — Ambulatory Visit (INDEPENDENT_AMBULATORY_CARE_PROVIDER_SITE_OTHER): Payer: Medicare Other

## 2012-08-20 DIAGNOSIS — Z7901 Long term (current) use of anticoagulants: Secondary | ICD-10-CM

## 2012-08-20 DIAGNOSIS — I4891 Unspecified atrial fibrillation: Secondary | ICD-10-CM

## 2012-09-17 ENCOUNTER — Ambulatory Visit (INDEPENDENT_AMBULATORY_CARE_PROVIDER_SITE_OTHER): Payer: Medicare Other

## 2012-09-17 DIAGNOSIS — Z7901 Long term (current) use of anticoagulants: Secondary | ICD-10-CM

## 2012-09-17 DIAGNOSIS — I4891 Unspecified atrial fibrillation: Secondary | ICD-10-CM

## 2012-10-27 ENCOUNTER — Other Ambulatory Visit: Payer: Self-pay | Admitting: Cardiovascular Disease

## 2012-10-28 ENCOUNTER — Other Ambulatory Visit: Payer: Self-pay | Admitting: Pharmacist

## 2012-10-28 MED ORDER — WARFARIN SODIUM 2 MG PO TABS: ORAL_TABLET | ORAL | Status: DC

## 2012-10-28 NOTE — Telephone Encounter (Signed)
Please review

## 2012-10-29 ENCOUNTER — Ambulatory Visit (INDEPENDENT_AMBULATORY_CARE_PROVIDER_SITE_OTHER): Payer: Medicare Other

## 2012-10-29 DIAGNOSIS — I4891 Unspecified atrial fibrillation: Secondary | ICD-10-CM

## 2012-10-29 DIAGNOSIS — Z7901 Long term (current) use of anticoagulants: Secondary | ICD-10-CM

## 2012-10-29 LAB — POCT INR: INR: 3.4

## 2012-10-29 NOTE — Telephone Encounter (Signed)
Please review

## 2012-11-09 ENCOUNTER — Other Ambulatory Visit: Payer: Self-pay | Admitting: Cardiovascular Disease

## 2012-11-30 ENCOUNTER — Other Ambulatory Visit: Payer: Self-pay | Admitting: Cardiovascular Disease

## 2012-12-03 ENCOUNTER — Ambulatory Visit (INDEPENDENT_AMBULATORY_CARE_PROVIDER_SITE_OTHER): Payer: Medicare Other

## 2012-12-03 DIAGNOSIS — Z7901 Long term (current) use of anticoagulants: Secondary | ICD-10-CM

## 2012-12-03 DIAGNOSIS — I4891 Unspecified atrial fibrillation: Secondary | ICD-10-CM

## 2012-12-09 ENCOUNTER — Other Ambulatory Visit: Payer: Self-pay | Admitting: Cardiovascular Disease

## 2012-12-14 ENCOUNTER — Ambulatory Visit: Payer: Self-pay | Admitting: Orthopedic Surgery

## 2012-12-14 ENCOUNTER — Inpatient Hospital Stay: Payer: Self-pay | Admitting: Orthopedic Surgery

## 2012-12-14 LAB — COMPREHENSIVE METABOLIC PANEL
Albumin: 3.5 g/dL (ref 3.4–5.0)
Alkaline Phosphatase: 63 U/L (ref 50–136)
Anion Gap: 3 — ABNORMAL LOW (ref 7–16)
BUN: 26 mg/dL — ABNORMAL HIGH (ref 7–18)
Bilirubin,Total: 0.3 mg/dL (ref 0.2–1.0)
Co2: 33 mmol/L — ABNORMAL HIGH (ref 21–32)
Creatinine: 0.97 mg/dL (ref 0.60–1.30)
EGFR (African American): 58 — ABNORMAL LOW
Glucose: 129 mg/dL — ABNORMAL HIGH (ref 65–99)
Osmolality: 288 (ref 275–301)
Potassium: 5.2 mmol/L — ABNORMAL HIGH (ref 3.5–5.1)
SGOT(AST): 42 U/L — ABNORMAL HIGH (ref 15–37)
SGPT (ALT): 20 U/L (ref 12–78)
Total Protein: 8 g/dL (ref 6.4–8.2)

## 2012-12-14 LAB — URINALYSIS, COMPLETE
Glucose,UR: NEGATIVE mg/dL (ref 0–75)
Ketone: NEGATIVE
Leukocyte Esterase: NEGATIVE
Ph: 8 (ref 4.5–8.0)
Protein: 30
RBC,UR: 1 /HPF (ref 0–5)
Specific Gravity: 1.017 (ref 1.003–1.030)
Squamous Epithelial: NONE SEEN

## 2012-12-14 LAB — CBC
HCT: 35.8 % (ref 35.0–47.0)
MCH: 32.5 pg (ref 26.0–34.0)
MCHC: 33.4 g/dL (ref 32.0–36.0)
MCV: 97 fL (ref 80–100)
Platelet: 240 10*3/uL (ref 150–440)
RBC: 3.68 10*6/uL — ABNORMAL LOW (ref 3.80–5.20)
RDW: 14.5 % (ref 11.5–14.5)

## 2012-12-14 LAB — PROTIME-INR: Prothrombin Time: 22.7 secs — ABNORMAL HIGH (ref 11.5–14.7)

## 2012-12-14 LAB — DIGOXIN LEVEL: Digoxin: 0.48 ng/mL

## 2012-12-15 LAB — CBC WITH DIFFERENTIAL/PLATELET
Basophil #: 0 10*3/uL (ref 0.0–0.1)
Basophil %: 0.6 %
Eosinophil %: 0.2 %
HCT: 30.1 % — ABNORMAL LOW (ref 35.0–47.0)
Lymphocyte #: 2.2 10*3/uL (ref 1.0–3.6)
MCH: 32.2 pg (ref 26.0–34.0)
MCHC: 33.2 g/dL (ref 32.0–36.0)
Monocyte #: 1.1 x10 3/mm — ABNORMAL HIGH (ref 0.2–0.9)
Monocyte %: 12.6 %
Neutrophil %: 61.8 %
Platelet: 212 10*3/uL (ref 150–440)
RDW: 14.4 % (ref 11.5–14.5)
WBC: 8.8 10*3/uL (ref 3.6–11.0)

## 2012-12-15 LAB — BASIC METABOLIC PANEL
BUN: 20 mg/dL — ABNORMAL HIGH (ref 7–18)
Calcium, Total: 8 mg/dL — ABNORMAL LOW (ref 8.5–10.1)
EGFR (Non-African Amer.): 54 — ABNORMAL LOW
Glucose: 86 mg/dL (ref 65–99)
Potassium: 4.5 mmol/L (ref 3.5–5.1)
Sodium: 143 mmol/L (ref 136–145)

## 2012-12-15 LAB — PROTIME-INR
INR: 2.1
Prothrombin Time: 23 secs — ABNORMAL HIGH (ref 11.5–14.7)

## 2012-12-16 DIAGNOSIS — I4891 Unspecified atrial fibrillation: Secondary | ICD-10-CM

## 2012-12-16 LAB — PROTIME-INR
INR: 1.5
Prothrombin Time: 17.9 secs — ABNORMAL HIGH (ref 11.5–14.7)
Prothrombin Time: 16.2 secs — ABNORMAL HIGH (ref 11.5–14.7)

## 2012-12-16 LAB — CBC WITH DIFFERENTIAL/PLATELET
Basophil #: 0.1 10*3/uL (ref 0.0–0.1)
Basophil %: 0.6 %
Eosinophil #: 0.1 10*3/uL (ref 0.0–0.7)
Eosinophil %: 0.6 %
HCT: 26.9 % — ABNORMAL LOW (ref 35.0–47.0)
Lymphocyte #: 2.7 10*3/uL (ref 1.0–3.6)
Lymphocyte %: 27.1 %
MCHC: 33.2 g/dL (ref 32.0–36.0)
MCV: 98 fL (ref 80–100)
Monocyte #: 1.4 x10 3/mm — ABNORMAL HIGH (ref 0.2–0.9)
Platelet: 187 10*3/uL (ref 150–440)
RBC: 2.76 10*6/uL — ABNORMAL LOW (ref 3.80–5.20)

## 2012-12-17 LAB — BASIC METABOLIC PANEL
Anion Gap: 5 — ABNORMAL LOW (ref 7–16)
Calcium, Total: 7.6 mg/dL — ABNORMAL LOW (ref 8.5–10.1)
Chloride: 102 mmol/L (ref 98–107)
Potassium: 4 mmol/L (ref 3.5–5.1)

## 2012-12-17 LAB — PROTIME-INR: Prothrombin Time: 17.4 secs — ABNORMAL HIGH (ref 11.5–14.7)

## 2012-12-17 LAB — HEMOGLOBIN
HGB: 7.8 g/dL — ABNORMAL LOW (ref 12.0–16.0)
HGB: 9.5 g/dL — ABNORMAL LOW (ref 12.0–16.0)

## 2012-12-19 ENCOUNTER — Encounter: Payer: Self-pay | Admitting: Internal Medicine

## 2012-12-19 LAB — PROTIME-INR
INR: 1.5
Prothrombin Time: 17.7 secs — ABNORMAL HIGH (ref 11.5–14.7)

## 2012-12-19 LAB — PATHOLOGY REPORT

## 2012-12-21 LAB — PROTIME-INR
INR: 3.7
Prothrombin Time: 35.5 secs — ABNORMAL HIGH (ref 11.5–14.7)

## 2012-12-22 LAB — CBC WITH DIFFERENTIAL/PLATELET
Basophil #: 0.1 10*3/uL (ref 0.0–0.1)
Eosinophil #: 0 10*3/uL (ref 0.0–0.7)
Eosinophil %: 0.3 %
HGB: 9.1 g/dL — ABNORMAL LOW (ref 12.0–16.0)
Lymphocyte #: 1.1 10*3/uL (ref 1.0–3.6)
Lymphocyte %: 6.9 %
MCH: 31.4 pg (ref 26.0–34.0)
MCHC: 32.6 g/dL (ref 32.0–36.0)
MCV: 96 fL (ref 80–100)
Neutrophil #: 13.1 10*3/uL — ABNORMAL HIGH (ref 1.4–6.5)
Platelet: 461 10*3/uL — ABNORMAL HIGH (ref 150–440)
RDW: 14.7 % — ABNORMAL HIGH (ref 11.5–14.5)

## 2012-12-22 LAB — URINALYSIS, COMPLETE
Bilirubin,UR: NEGATIVE
Blood: NEGATIVE
Glucose,UR: NEGATIVE mg/dL (ref 0–75)
Ph: 5 (ref 4.5–8.0)
Protein: 30
RBC,UR: 1 /HPF (ref 0–5)
Squamous Epithelial: NONE SEEN

## 2012-12-22 LAB — COMPREHENSIVE METABOLIC PANEL
BUN: 43 mg/dL — ABNORMAL HIGH (ref 7–18)
Bilirubin,Total: 0.4 mg/dL (ref 0.2–1.0)
Calcium, Total: 8 mg/dL — ABNORMAL LOW (ref 8.5–10.1)
Chloride: 106 mmol/L (ref 98–107)
Co2: 28 mmol/L (ref 21–32)
Glucose: 145 mg/dL — ABNORMAL HIGH (ref 65–99)
Osmolality: 291 (ref 275–301)
Potassium: 4 mmol/L (ref 3.5–5.1)
SGOT(AST): 25 U/L (ref 15–37)
SGPT (ALT): 15 U/L (ref 12–78)

## 2012-12-22 LAB — TSH: Thyroid Stimulating Horm: 1.5 u[IU]/mL

## 2012-12-22 LAB — DIGOXIN LEVEL: Digoxin: 1.27 ng/mL

## 2012-12-22 LAB — PROTIME-INR
INR: 3.6
Prothrombin Time: 34.9 secs — ABNORMAL HIGH (ref 11.5–14.7)

## 2012-12-23 ENCOUNTER — Ambulatory Visit: Payer: Self-pay | Admitting: Gerontology

## 2012-12-23 LAB — PROTIME-INR
INR: 2.9
Prothrombin Time: 29.2 secs — ABNORMAL HIGH (ref 11.5–14.7)

## 2012-12-25 LAB — BASIC METABOLIC PANEL
Anion Gap: 5 — ABNORMAL LOW (ref 7–16)
BUN: 40 mg/dL — ABNORMAL HIGH (ref 7–18)
Chloride: 107 mmol/L (ref 98–107)
Co2: 28 mmol/L (ref 21–32)
Glucose: 81 mg/dL (ref 65–99)
Osmolality: 288 (ref 275–301)
Sodium: 140 mmol/L (ref 136–145)

## 2012-12-25 LAB — CBC WITH DIFFERENTIAL/PLATELET
Basophil #: 0 10*3/uL (ref 0.0–0.1)
Eosinophil #: 0.1 10*3/uL (ref 0.0–0.7)
Eosinophil %: 1 %
HGB: 8.8 g/dL — ABNORMAL LOW (ref 12.0–16.0)
Lymphocyte #: 1.3 10*3/uL (ref 1.0–3.6)
MCH: 31.4 pg (ref 26.0–34.0)
Monocyte #: 1 x10 3/mm — ABNORMAL HIGH (ref 0.2–0.9)
Monocyte %: 9.9 %
Neutrophil %: 76.2 %
RBC: 2.79 10*6/uL — ABNORMAL LOW (ref 3.80–5.20)

## 2012-12-26 LAB — PROTIME-INR: INR: 6.1

## 2012-12-30 LAB — PROTIME-INR
INR: 5.9
Prothrombin Time: 49.6 secs — ABNORMAL HIGH (ref 11.5–14.7)

## 2013-01-06 LAB — PROTIME-INR: Prothrombin Time: 21.3 secs — ABNORMAL HIGH (ref 11.5–14.7)

## 2013-01-07 LAB — CBC
HCT: 26.6 % — ABNORMAL LOW (ref 35.0–47.0)
HGB: 8.8 g/dL — ABNORMAL LOW (ref 12.0–16.0)
MCHC: 33.3 g/dL (ref 32.0–36.0)
MCV: 93 fL (ref 80–100)
Platelet: 359 10*3/uL (ref 150–440)
RBC: 2.88 10*6/uL — ABNORMAL LOW (ref 3.80–5.20)
RDW: 15.3 % — ABNORMAL HIGH (ref 11.5–14.5)

## 2013-01-07 LAB — COMPREHENSIVE METABOLIC PANEL
Albumin: 2.1 g/dL — ABNORMAL LOW (ref 3.4–5.0)
Anion Gap: 6 — ABNORMAL LOW (ref 7–16)
BUN: 11 mg/dL (ref 7–18)
Bilirubin,Total: 0.5 mg/dL (ref 0.2–1.0)
Calcium, Total: 8.3 mg/dL — ABNORMAL LOW (ref 8.5–10.1)
Chloride: 102 mmol/L (ref 98–107)
Co2: 32 mmol/L (ref 21–32)
SGOT(AST): 26 U/L (ref 15–37)
SGPT (ALT): 15 U/L (ref 12–78)
Total Protein: 6.9 g/dL (ref 6.4–8.2)

## 2013-01-07 LAB — TROPONIN I: Troponin-I: <0.02 ng/mL

## 2013-01-07 LAB — PRO B NATRIURETIC PEPTIDE: B-Type Natriuretic Peptide: 4364 pg/mL — ABNORMAL HIGH (ref 0–450)

## 2013-01-07 LAB — PROTIME-INR
INR: 1.8
Prothrombin Time: 20.5 secs — ABNORMAL HIGH (ref 11.5–14.7)

## 2013-01-08 ENCOUNTER — Inpatient Hospital Stay: Payer: Self-pay | Admitting: Internal Medicine

## 2013-01-08 ENCOUNTER — Encounter: Payer: Self-pay | Admitting: Internal Medicine

## 2013-01-08 LAB — URINALYSIS, COMPLETE
Bilirubin,UR: NEGATIVE
Glucose,UR: NEGATIVE mg/dL (ref 0–75)
Ketone: NEGATIVE
RBC,UR: 2 /HPF (ref 0–5)
Squamous Epithelial: 1
WBC UR: 449 /HPF (ref 0–5)

## 2013-01-08 LAB — RETICULOCYTES: Absolute Retic Count: 0.0676 10*6/uL

## 2013-01-08 LAB — IRON AND TIBC
Iron Saturation: 13 %
Iron: 30 ug/dL — ABNORMAL LOW (ref 50–170)

## 2013-01-08 LAB — CK TOTAL AND CKMB (NOT AT ARMC)
CK, Total: 62 U/L (ref 21–215)
CK, Total: 26 U/L (ref 21–215)
CK-MB: <0.5 ng/mL — ABNORMAL LOW (ref 0.5–3.6)
CK-MB: <0.5 ng/mL — ABNORMAL LOW (ref 0.5–3.6)
CK-MB: <0.5 ng/mL — ABNORMAL LOW (ref 0.5–3.6)

## 2013-01-08 LAB — TROPONIN I: Troponin-I: <0.02 ng/mL

## 2013-01-09 LAB — BASIC METABOLIC PANEL
Anion Gap: 3 — ABNORMAL LOW (ref 7–16)
Co2: 38 mmol/L — ABNORMAL HIGH (ref 21–32)
Creatinine: 0.88 mg/dL (ref 0.60–1.30)
EGFR (African American): >60
EGFR (Non-African Amer.): 56 — ABNORMAL LOW
Glucose: 87 mg/dL (ref 65–99)
Osmolality: 283 (ref 275–301)
Sodium: 142 mmol/L (ref 136–145)

## 2013-01-09 LAB — PROTIME-INR: INR: 1.7

## 2013-01-10 LAB — BASIC METABOLIC PANEL
Calcium, Total: 8.5 mg/dL (ref 8.5–10.1)
Chloride: 100 mmol/L (ref 98–107)
Sodium: 142 mmol/L (ref 136–145)

## 2013-01-10 LAB — URINE CULTURE

## 2013-01-11 LAB — BASIC METABOLIC PANEL
Anion Gap: 4 — ABNORMAL LOW (ref 7–16)
BUN: 19 mg/dL — ABNORMAL HIGH (ref 7–18)
Chloride: 97 mmol/L — ABNORMAL LOW (ref 98–107)
EGFR (African American): >60
Osmolality: 283 (ref 275–301)
Potassium: 3.4 mmol/L — ABNORMAL LOW (ref 3.5–5.1)
Sodium: 141 mmol/L (ref 136–145)

## 2013-01-11 LAB — PROTIME-INR: Prothrombin Time: 18.5 secs — ABNORMAL HIGH (ref 11.5–14.7)

## 2013-01-12 LAB — BASIC METABOLIC PANEL
Anion Gap: 2 — ABNORMAL LOW (ref 7–16)
BUN: 23 mg/dL — ABNORMAL HIGH (ref 7–18)
Calcium, Total: 8.4 mg/dL — ABNORMAL LOW (ref 8.5–10.1)
Chloride: 95 mmol/L — ABNORMAL LOW (ref 98–107)
Co2: 41 mmol/L (ref 21–32)
Osmolality: 279 (ref 275–301)
Potassium: 3.3 mmol/L — ABNORMAL LOW (ref 3.5–5.1)
Sodium: 138 mmol/L (ref 136–145)

## 2013-01-12 LAB — PROTIME-INR
INR: 1.5
Prothrombin Time: 17.8 secs — ABNORMAL HIGH (ref 11.5–14.7)

## 2013-02-11 ENCOUNTER — Telehealth: Payer: Self-pay

## 2013-02-11 NOTE — Telephone Encounter (Signed)
Pt's granddaughter returned call to clinic.  Pt fell in April and broke her hip, she has been in [facility] since that time.  They are monitoring and managing pt's Coumadin at the present.  Pt's granddaughter states she will call clinic to schedule f/u appt once pt is discharged from facility.

## 2013-02-11 NOTE — Telephone Encounter (Signed)
Attempted to contact pt past due for Coumadin follow-up LMOM at home and mobile #'s to call for appt ASAP.  Mailed delinquent Coumadin letter to home address.

## 2013-03-03 ENCOUNTER — Ambulatory Visit: Payer: Self-pay | Admitting: General Surgery

## 2013-03-31 ENCOUNTER — Encounter: Payer: Self-pay | Admitting: *Deleted

## 2013-04-17 ENCOUNTER — Other Ambulatory Visit: Payer: Medicare Other

## 2013-04-17 ENCOUNTER — Ambulatory Visit (INDEPENDENT_AMBULATORY_CARE_PROVIDER_SITE_OTHER): Payer: Medicare Other

## 2013-04-17 DIAGNOSIS — Z7901 Long term (current) use of anticoagulants: Secondary | ICD-10-CM

## 2013-04-17 DIAGNOSIS — I4891 Unspecified atrial fibrillation: Secondary | ICD-10-CM

## 2013-05-22 ENCOUNTER — Ambulatory Visit (INDEPENDENT_AMBULATORY_CARE_PROVIDER_SITE_OTHER): Payer: Medicare Other | Admitting: *Deleted

## 2013-05-22 DIAGNOSIS — Z7901 Long term (current) use of anticoagulants: Secondary | ICD-10-CM

## 2013-05-22 DIAGNOSIS — I4891 Unspecified atrial fibrillation: Secondary | ICD-10-CM

## 2013-05-22 LAB — POCT INR: INR: 2.1

## 2013-06-17 ENCOUNTER — Ambulatory Visit (INDEPENDENT_AMBULATORY_CARE_PROVIDER_SITE_OTHER): Payer: Medicare Other | Admitting: General Practice

## 2013-06-17 DIAGNOSIS — Z7901 Long term (current) use of anticoagulants: Secondary | ICD-10-CM

## 2013-06-17 DIAGNOSIS — I4891 Unspecified atrial fibrillation: Secondary | ICD-10-CM

## 2013-06-17 LAB — POCT INR: INR: 2.3

## 2013-07-29 ENCOUNTER — Ambulatory Visit (INDEPENDENT_AMBULATORY_CARE_PROVIDER_SITE_OTHER): Payer: Medicare Other | Admitting: General Practice

## 2013-07-29 DIAGNOSIS — Z7901 Long term (current) use of anticoagulants: Secondary | ICD-10-CM

## 2013-07-29 DIAGNOSIS — I4891 Unspecified atrial fibrillation: Secondary | ICD-10-CM

## 2013-08-26 ENCOUNTER — Telehealth: Payer: Self-pay

## 2013-08-26 NOTE — Telephone Encounter (Signed)
Patient went to PCP (Dr. Sullivan Lone) yesterday to be evaluated for hemorrhoid bleed and while there, Dr. Sullivan Lone checked her INR which was 2.8. The patient is coming on September 16, 2012 to have her PT/INR check.

## 2013-10-01 LAB — PROTIME-INR: INR: 8.4 — AB (ref <=1.1)

## 2013-10-02 ENCOUNTER — Ambulatory Visit (INDEPENDENT_AMBULATORY_CARE_PROVIDER_SITE_OTHER): Payer: Medicare Other | Admitting: Cardiovascular Disease

## 2013-10-02 ENCOUNTER — Telehealth: Payer: Self-pay

## 2013-10-02 DIAGNOSIS — Z7901 Long term (current) use of anticoagulants: Secondary | ICD-10-CM

## 2013-10-02 DIAGNOSIS — I4891 Unspecified atrial fibrillation: Secondary | ICD-10-CM

## 2013-10-02 NOTE — Telephone Encounter (Signed)
Returned call, pt had INR checked yesterday 8.4.  Pt has been on multiple courses of abx and has cancelled f/u appt secondary to illness.  Dr Venia Minks advised pt to hold Coumadin and await call back from [place] for further instructions.  Go to ED with bleeding problems. ontinue to be on Coumadin secondary to age and non-compliance.  Will discuss with Dr Rockey Situ.  Attempted to call pt, LMOM TCB for further dosing.  See anticoagulation note in EPIC.

## 2013-10-02 NOTE — Telephone Encounter (Signed)
Sarah with [facility] states DR. Venia Minks would like someone to call her regarding this pt. (callback # is the doctor line)

## 2013-10-02 NOTE — Telephone Encounter (Signed)
Dr. Venia Minks needs to speak w/ coumadin clinic about pt's dosage.  Please call.  Thank you!

## 2013-10-07 ENCOUNTER — Ambulatory Visit (INDEPENDENT_AMBULATORY_CARE_PROVIDER_SITE_OTHER): Payer: Medicare Other

## 2013-10-07 DIAGNOSIS — Z7901 Long term (current) use of anticoagulants: Secondary | ICD-10-CM

## 2013-10-07 DIAGNOSIS — I4891 Unspecified atrial fibrillation: Secondary | ICD-10-CM

## 2013-10-07 LAB — POCT INR: INR: 3.1

## 2013-10-21 ENCOUNTER — Ambulatory Visit (INDEPENDENT_AMBULATORY_CARE_PROVIDER_SITE_OTHER): Payer: Medicare Other | Admitting: Pharmacist

## 2013-10-21 DIAGNOSIS — Z7901 Long term (current) use of anticoagulants: Secondary | ICD-10-CM

## 2013-10-21 DIAGNOSIS — I4891 Unspecified atrial fibrillation: Secondary | ICD-10-CM

## 2013-10-21 LAB — POCT INR: INR: 4.5

## 2013-12-13 IMAGING — CR PELVIS - 1-2 VIEW
1 series · 1 of 1 positions shown · non-contrast
Comparison: none

REASON FOR EXAM: L hip pain
COMMENTS:

[t pelvis ap]
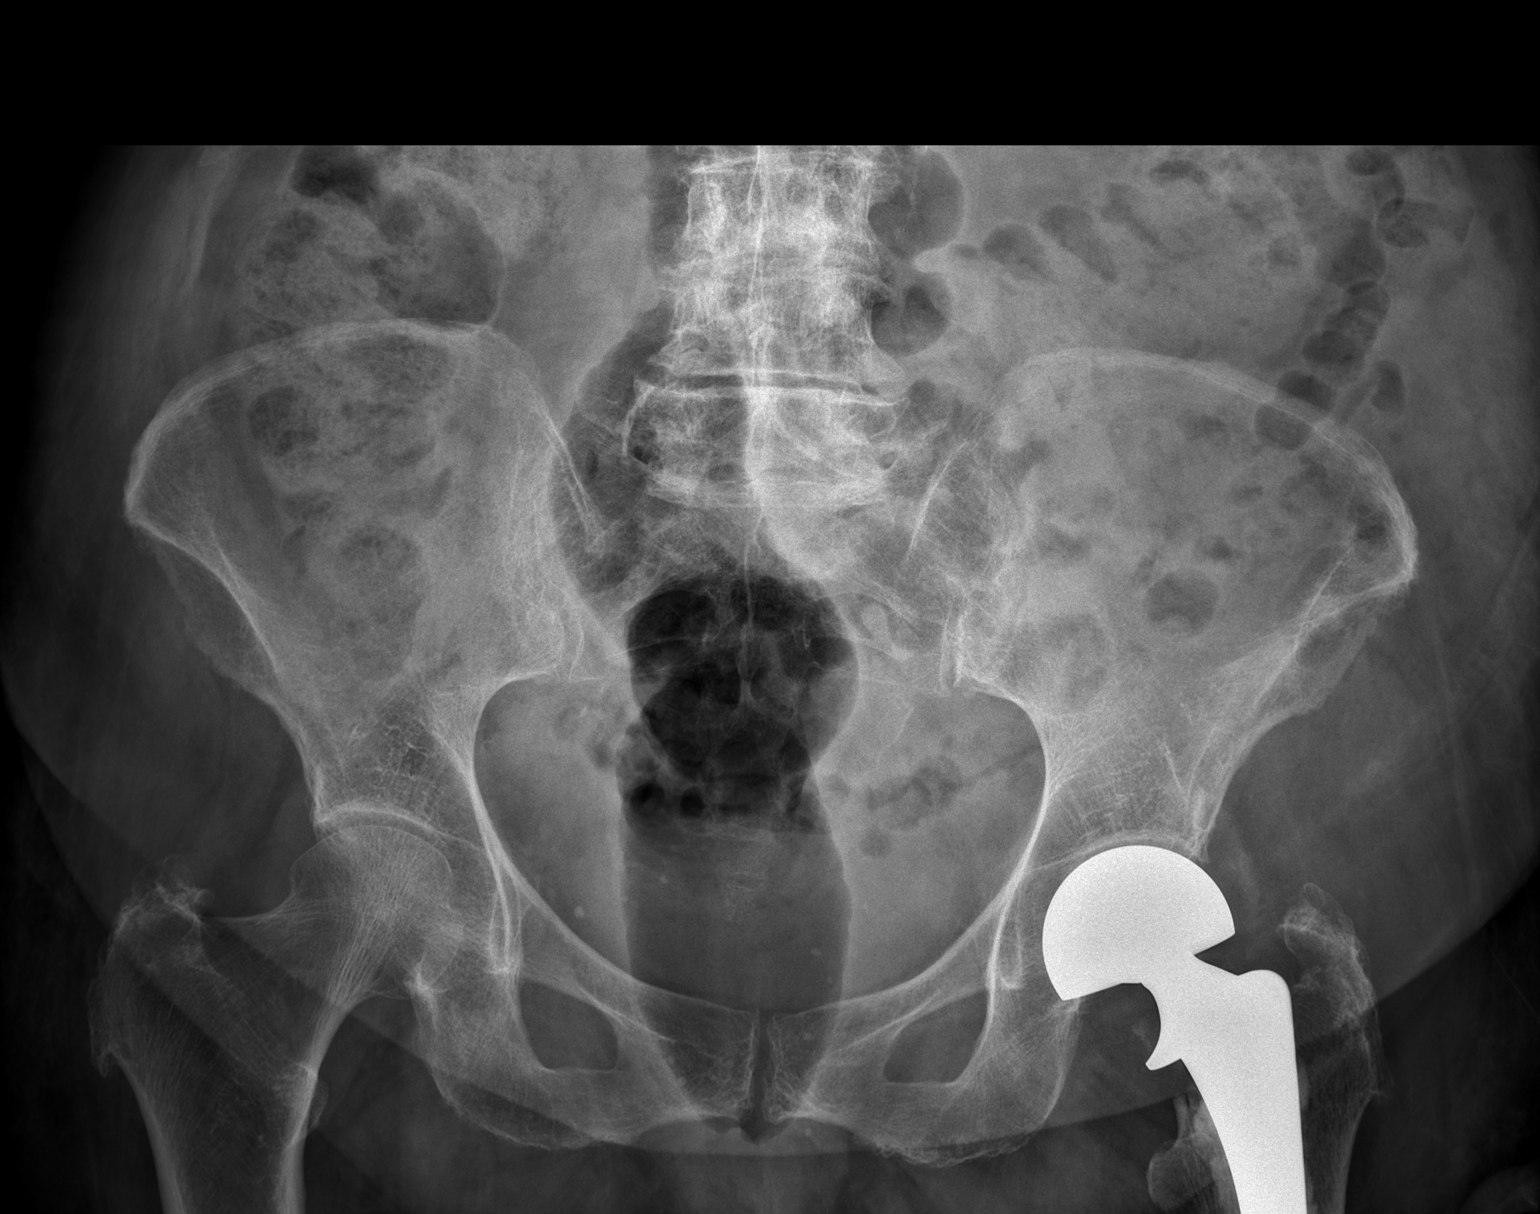

[1 of 1 positions shown; findings below may reference images not displayed]

PROCEDURE:     DXR - DXR PELVIS AP ONLY  - January 07, 2013  [DATE]

RESULT:     The bony pelvis is osteopenic. There is a prosthetic left hip
joint. The right hip exhibits no more than mild degenerative change. There
is no evidence of acute fracture of the pelvis. There is a high-grade wedge
compression of L2.
IMPRESSION: There is no acute bony abnormality of the pelvis.

[REDACTED]

## 2013-12-13 IMAGING — CR DG HIP COMPLETE 2+V*L*
1 series · 2 of 2 positions shown · non-contrast
Comparison: none

REASON FOR EXAM: L hip pain
COMMENTS:

PROCEDURE:     DXR - DXR HIP LEFT COMPLETE  - January 07, 2013  [DATE]
RESULT:     AP and lateral views of the left hip reveal the presence of a
prosthetic joint. The interface between the native bone and the prosthetic
shaft appears normal. The acetabular cup is normally positioned.

[Series 1: t hip ap left · 0.14mm/px · 2 of 2 slices shown]
[im 1/2]
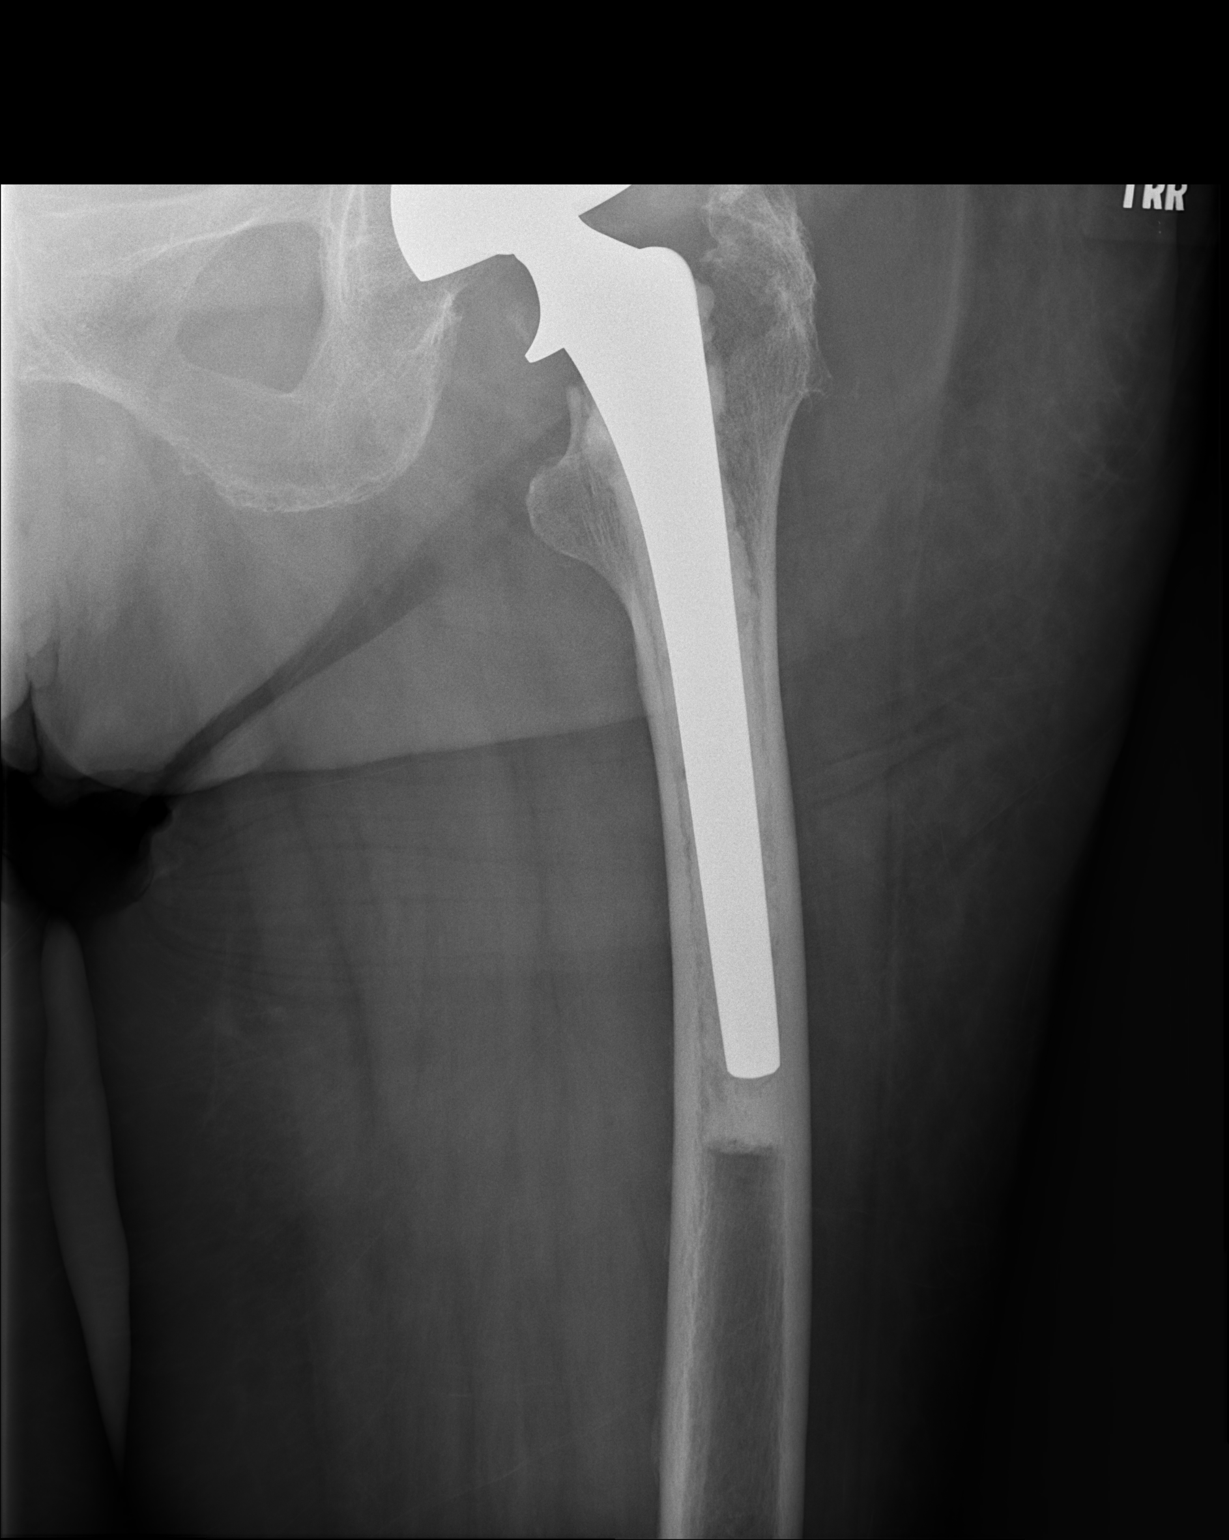
[im 2/2]
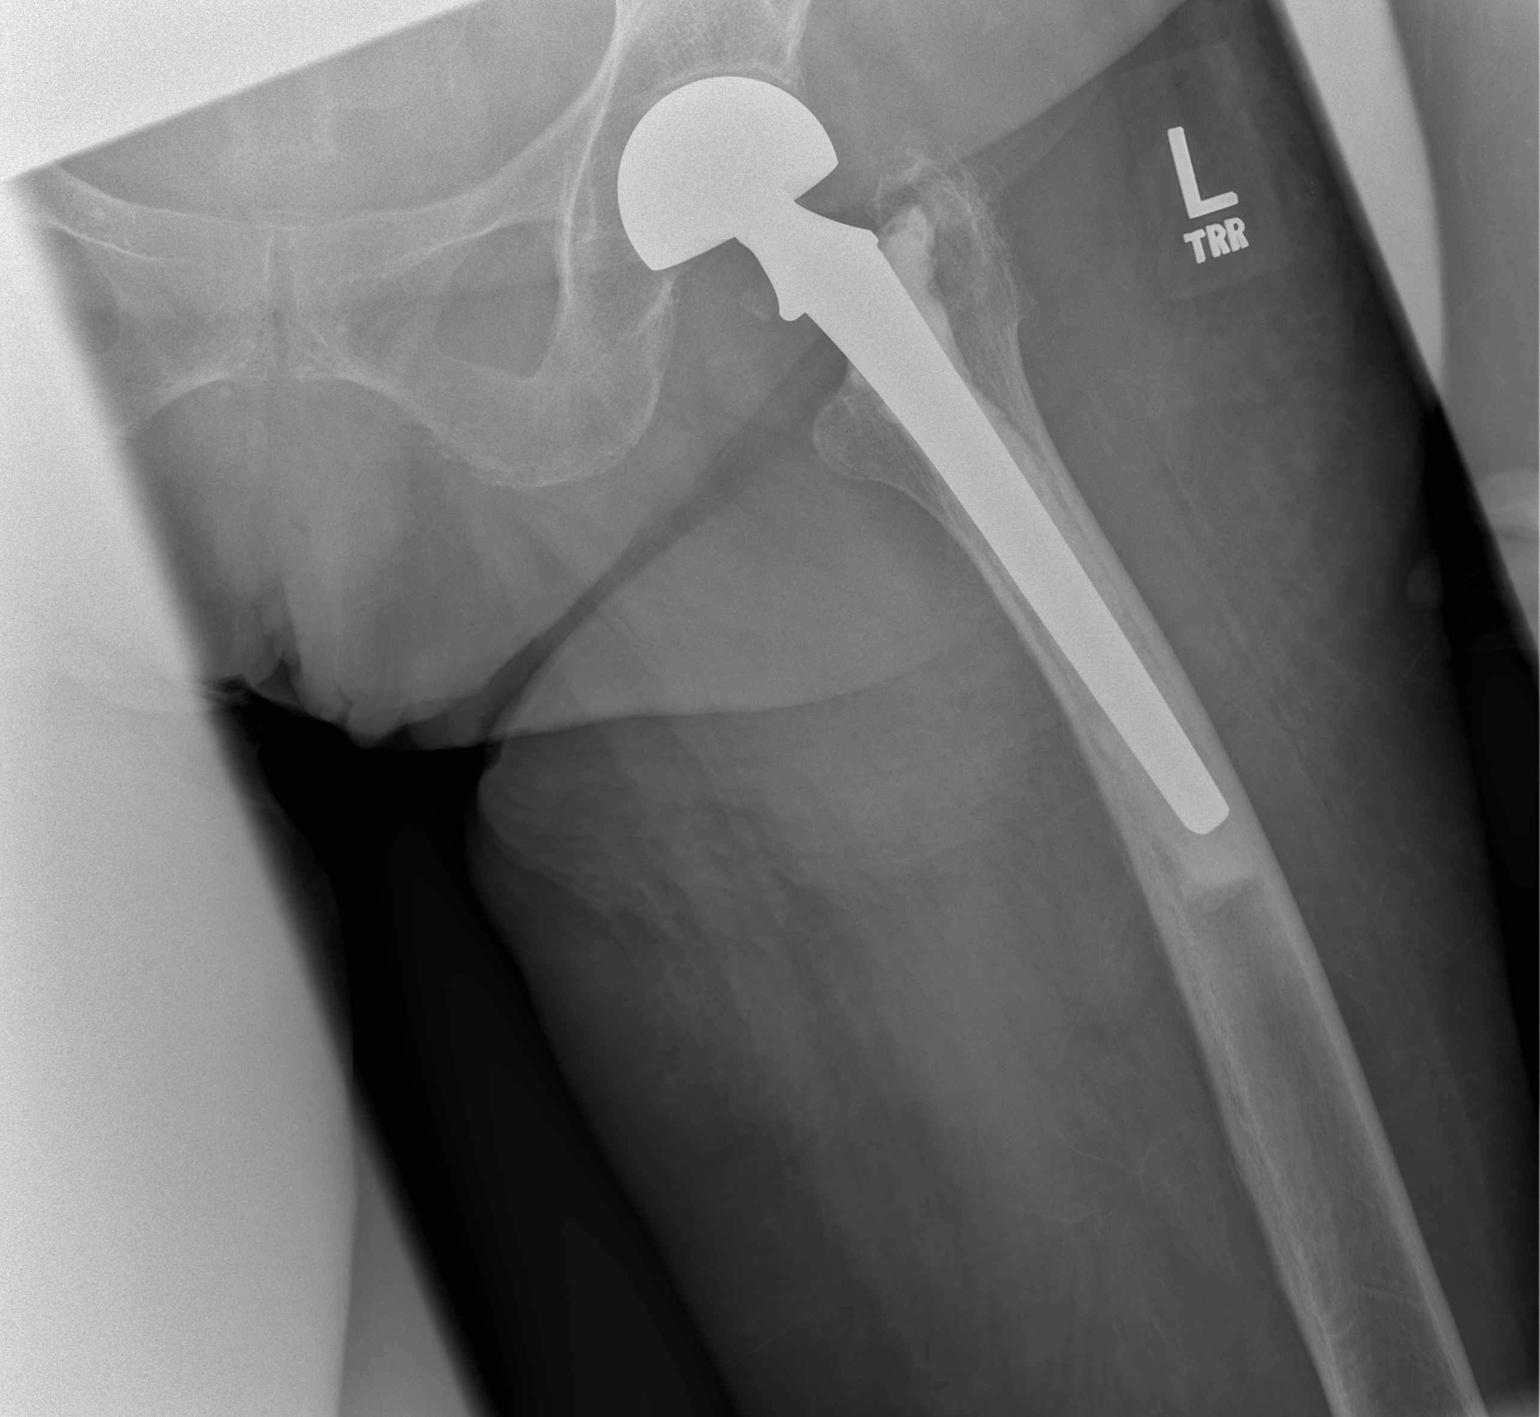

[2 of 2 positions shown; findings below may reference images not displayed]

IMPRESSION: There is no acute bony abnormality of the prosthetic left
hip.

[REDACTED]

## 2013-12-16 IMAGING — CR DG CHEST 1V PORT
1 series · 1 of 1 positions shown · non-contrast
Comparison: none

REASON FOR EXAM: crackles at lung bases with need for O2 pelase eval for
pulm edema
COMMENTS:

PROCEDURE:     DXR - DXR PORTABLE CHEST SINGLE VIEW  - January 10, 2013 [DATE]
RESULT:     Comparison made to prior study of 01/07/2013. Cardiomegaly with
interstitial prominence and pulmonary vascular prominence. Findings
consistent with congestive heart failure with pulmonary edema.

[ap]
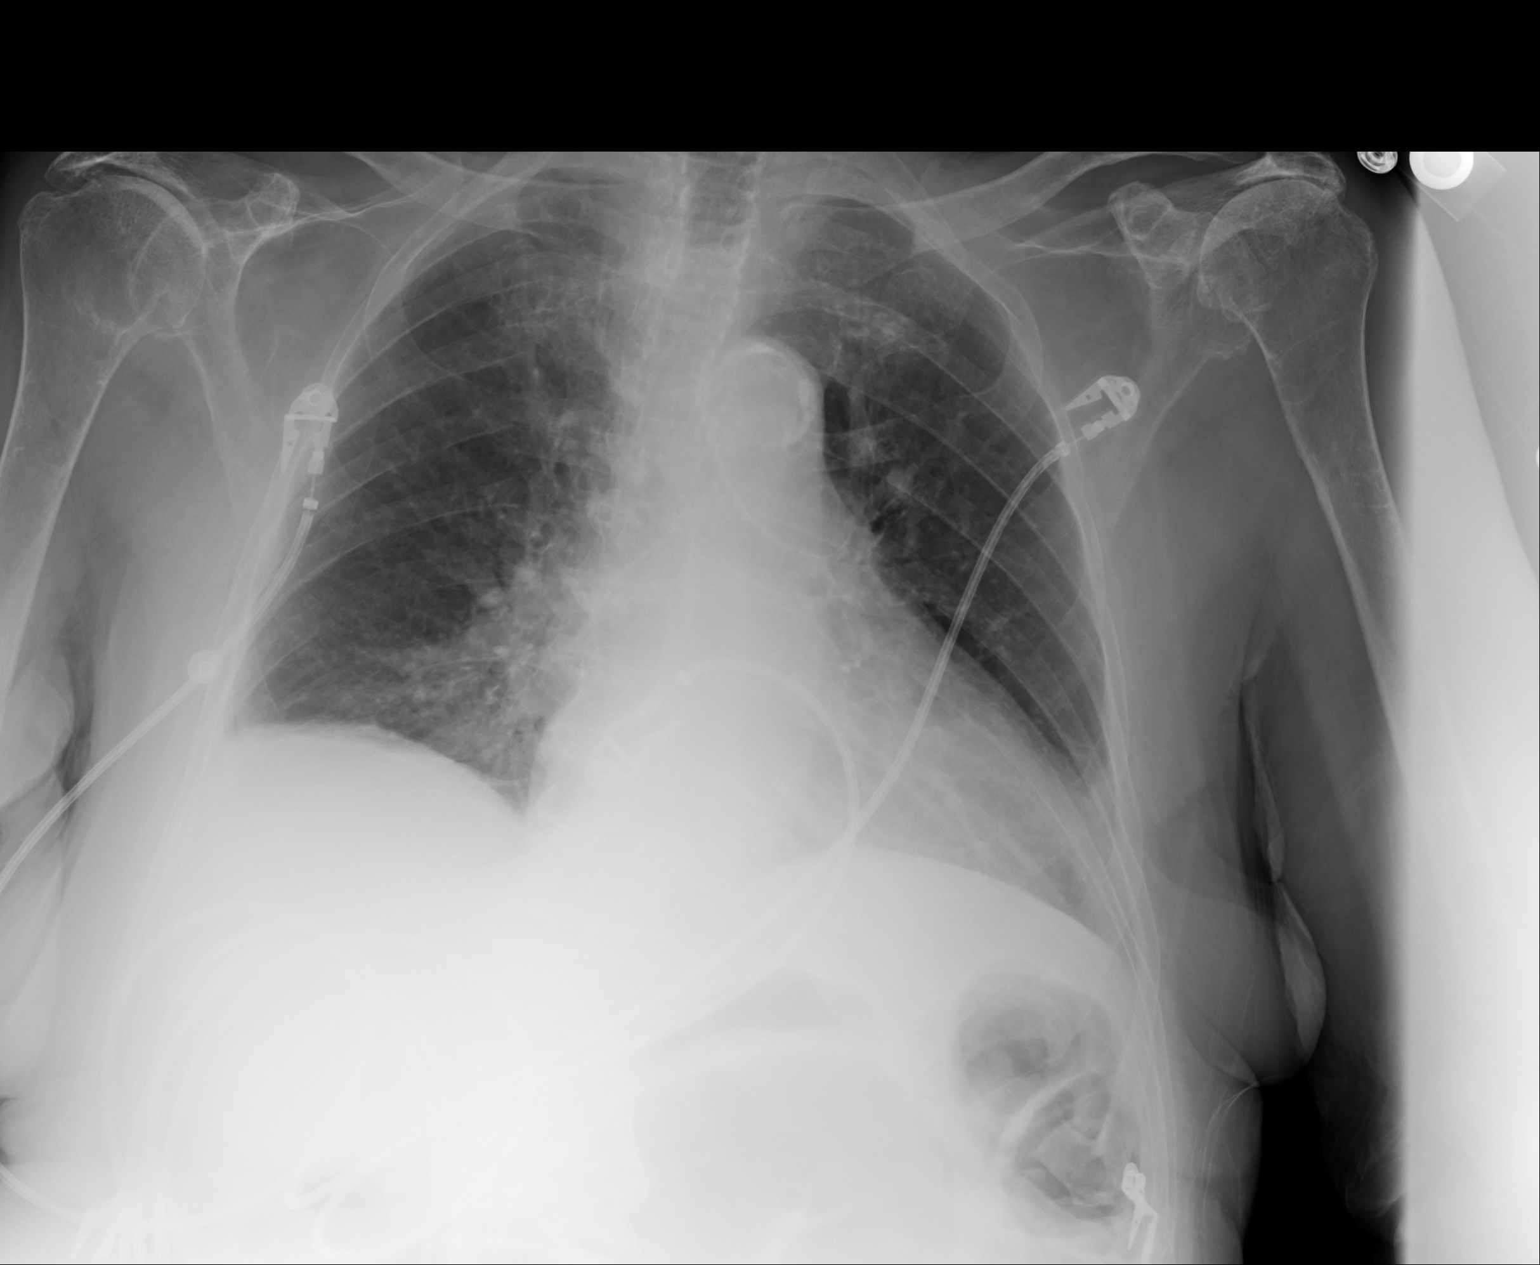

[1 of 1 positions shown; findings below may reference images not displayed]

IMPRESSION: CHF with pulmonary edema. Findings have increased slightly
from prior exam.

## 2013-12-18 ENCOUNTER — Ambulatory Visit (INDEPENDENT_AMBULATORY_CARE_PROVIDER_SITE_OTHER): Payer: Medicare Other

## 2013-12-18 DIAGNOSIS — Z7901 Long term (current) use of anticoagulants: Secondary | ICD-10-CM

## 2013-12-18 DIAGNOSIS — I4891 Unspecified atrial fibrillation: Secondary | ICD-10-CM

## 2013-12-18 LAB — POCT INR: INR: 2.7

## 2014-01-13 ENCOUNTER — Ambulatory Visit (INDEPENDENT_AMBULATORY_CARE_PROVIDER_SITE_OTHER): Payer: Medicare Other

## 2014-01-13 DIAGNOSIS — I4891 Unspecified atrial fibrillation: Secondary | ICD-10-CM

## 2014-01-13 DIAGNOSIS — Z7901 Long term (current) use of anticoagulants: Secondary | ICD-10-CM

## 2014-01-13 LAB — POCT INR: INR: 2.7

## 2014-02-19 ENCOUNTER — Ambulatory Visit (INDEPENDENT_AMBULATORY_CARE_PROVIDER_SITE_OTHER): Payer: Medicare Other

## 2014-02-19 DIAGNOSIS — I4891 Unspecified atrial fibrillation: Secondary | ICD-10-CM

## 2014-02-19 DIAGNOSIS — Z7901 Long term (current) use of anticoagulants: Secondary | ICD-10-CM

## 2014-02-19 LAB — POCT INR: INR: 1.7

## 2014-05-12 ENCOUNTER — Ambulatory Visit (INDEPENDENT_AMBULATORY_CARE_PROVIDER_SITE_OTHER): Payer: Medicare Other

## 2014-05-12 DIAGNOSIS — Z7901 Long term (current) use of anticoagulants: Secondary | ICD-10-CM

## 2014-05-12 DIAGNOSIS — I4891 Unspecified atrial fibrillation: Secondary | ICD-10-CM

## 2014-05-12 LAB — POCT INR: INR: 2.9

## 2014-06-23 ENCOUNTER — Ambulatory Visit (INDEPENDENT_AMBULATORY_CARE_PROVIDER_SITE_OTHER): Payer: Medicare Other

## 2014-06-23 DIAGNOSIS — I4891 Unspecified atrial fibrillation: Secondary | ICD-10-CM

## 2014-06-23 DIAGNOSIS — Z7901 Long term (current) use of anticoagulants: Secondary | ICD-10-CM

## 2014-06-23 LAB — POCT INR: INR: 2.5

## 2014-08-11 ENCOUNTER — Telehealth: Payer: Self-pay | Admitting: Cardiovascular Disease

## 2014-08-11 NOTE — Telephone Encounter (Signed)
Needs to make apt with dr Rockey Situ. Over due

## 2014-08-25 ENCOUNTER — Ambulatory Visit (INDEPENDENT_AMBULATORY_CARE_PROVIDER_SITE_OTHER): Payer: Medicare Other

## 2014-08-25 DIAGNOSIS — I4891 Unspecified atrial fibrillation: Secondary | ICD-10-CM

## 2014-08-25 DIAGNOSIS — Z7901 Long term (current) use of anticoagulants: Secondary | ICD-10-CM

## 2014-08-25 LAB — POCT INR: INR: 3.8

## 2014-09-09 ENCOUNTER — Observation Stay: Payer: Self-pay | Admitting: Internal Medicine

## 2014-09-09 LAB — URINALYSIS, COMPLETE
BACTERIA: NONE SEEN
BLOOD: NEGATIVE
Bilirubin,UR: NEGATIVE
GLUCOSE, UR: NEGATIVE mg/dL (ref 0–75)
Hyaline Cast: 5
KETONE: NEGATIVE
LEUKOCYTE ESTERASE: NEGATIVE
Nitrite: NEGATIVE
PROTEIN: NEGATIVE
Ph: 5 (ref 4.5–8.0)
Specific Gravity: 1.012 (ref 1.003–1.030)
Squamous Epithelial: <1

## 2014-09-09 LAB — PROTIME-INR
INR: 2.2
Prothrombin Time: 23.9 secs — ABNORMAL HIGH (ref 11.5–14.7)

## 2014-09-09 LAB — COMPREHENSIVE METABOLIC PANEL
ALBUMIN: 3.1 g/dL — AB (ref 3.4–5.0)
ALK PHOS: 61 U/L
ALT: 15 U/L
ANION GAP: 5 — AB (ref 7–16)
BILIRUBIN TOTAL: 0.4 mg/dL (ref 0.2–1.0)
BUN: 29 mg/dL — AB (ref 7–18)
CALCIUM: 8.4 mg/dL — AB (ref 8.5–10.1)
CO2: 31 mmol/L (ref 21–32)
CREATININE: 1.71 mg/dL — AB (ref 0.60–1.30)
Chloride: 105 mmol/L (ref 98–107)
GFR CALC AF AMER: 36 — AB
GFR CALC NON AF AMER: 29 — AB
GLUCOSE: 88 mg/dL (ref 65–99)
Osmolality: 287 (ref 275–301)
Potassium: 4.2 mmol/L (ref 3.5–5.1)
SGOT(AST): 24 U/L (ref 15–37)
SODIUM: 141 mmol/L (ref 136–145)
Total Protein: 8.5 g/dL — ABNORMAL HIGH (ref 6.4–8.2)

## 2014-09-09 LAB — CBC
HCT: 32.5 % — AB (ref 35.0–47.0)
HGB: 10.6 g/dL — ABNORMAL LOW (ref 12.0–16.0)
MCH: 31.8 pg (ref 26.0–34.0)
MCHC: 32.6 g/dL (ref 32.0–36.0)
MCV: 98 fL (ref 80–100)
Platelet: 267 10*3/uL (ref 150–440)
RBC: 3.32 10*6/uL — ABNORMAL LOW (ref 3.80–5.20)
RDW: 13.8 % (ref 11.5–14.5)
WBC: 6.2 10*3/uL (ref 3.6–11.0)

## 2014-09-09 LAB — TROPONIN I

## 2014-09-09 LAB — DIGOXIN LEVEL: Digoxin: 0.7 ng/mL

## 2014-09-10 DIAGNOSIS — E860 Dehydration: Secondary | ICD-10-CM | POA: Diagnosis not present

## 2014-09-10 DIAGNOSIS — Z9010 Acquired absence of unspecified breast and nipple: Secondary | ICD-10-CM | POA: Diagnosis not present

## 2014-09-10 DIAGNOSIS — Z7901 Long term (current) use of anticoagulants: Secondary | ICD-10-CM | POA: Diagnosis not present

## 2014-09-10 DIAGNOSIS — R410 Disorientation, unspecified: Secondary | ICD-10-CM | POA: Diagnosis not present

## 2014-09-10 DIAGNOSIS — I501 Left ventricular failure: Secondary | ICD-10-CM | POA: Diagnosis not present

## 2014-09-10 DIAGNOSIS — F0390 Unspecified dementia without behavioral disturbance: Secondary | ICD-10-CM | POA: Diagnosis not present

## 2014-09-10 DIAGNOSIS — I4891 Unspecified atrial fibrillation: Secondary | ICD-10-CM | POA: Diagnosis not present

## 2014-09-10 DIAGNOSIS — I700 Atherosclerosis of aorta: Secondary | ICD-10-CM | POA: Diagnosis not present

## 2014-09-10 DIAGNOSIS — R4182 Altered mental status, unspecified: Secondary | ICD-10-CM | POA: Diagnosis not present

## 2014-09-10 DIAGNOSIS — Z79899 Other long term (current) drug therapy: Secondary | ICD-10-CM | POA: Diagnosis not present

## 2014-09-10 DIAGNOSIS — E785 Hyperlipidemia, unspecified: Secondary | ICD-10-CM | POA: Diagnosis not present

## 2014-09-10 DIAGNOSIS — Z803 Family history of malignant neoplasm of breast: Secondary | ICD-10-CM | POA: Diagnosis not present

## 2014-09-10 DIAGNOSIS — Z853 Personal history of malignant neoplasm of breast: Secondary | ICD-10-CM | POA: Diagnosis not present

## 2014-09-10 DIAGNOSIS — I739 Peripheral vascular disease, unspecified: Secondary | ICD-10-CM | POA: Diagnosis not present

## 2014-09-10 DIAGNOSIS — I10 Essential (primary) hypertension: Secondary | ICD-10-CM | POA: Diagnosis not present

## 2014-09-10 DIAGNOSIS — R55 Syncope and collapse: Secondary | ICD-10-CM | POA: Diagnosis not present

## 2014-09-10 DIAGNOSIS — Z800 Family history of malignant neoplasm of digestive organs: Secondary | ICD-10-CM | POA: Diagnosis not present

## 2014-09-10 DIAGNOSIS — Z85828 Personal history of other malignant neoplasm of skin: Secondary | ICD-10-CM | POA: Diagnosis not present

## 2014-09-10 DIAGNOSIS — Z23 Encounter for immunization: Secondary | ICD-10-CM | POA: Diagnosis not present

## 2014-09-10 DIAGNOSIS — Z7951 Long term (current) use of inhaled steroids: Secondary | ICD-10-CM | POA: Diagnosis not present

## 2014-09-10 DIAGNOSIS — N179 Acute kidney failure, unspecified: Secondary | ICD-10-CM | POA: Diagnosis not present

## 2014-09-10 DIAGNOSIS — J40 Bronchitis, not specified as acute or chronic: Secondary | ICD-10-CM | POA: Diagnosis not present

## 2014-09-10 DIAGNOSIS — I6523 Occlusion and stenosis of bilateral carotid arteries: Secondary | ICD-10-CM | POA: Diagnosis not present

## 2014-09-10 LAB — CBC WITH DIFFERENTIAL/PLATELET
BASOS PCT: 0.8 %
Basophil #: 0 10*3/uL (ref 0.0–0.1)
Eosinophil #: 0 10*3/uL (ref 0.0–0.7)
Eosinophil %: 0.5 %
HCT: 29.6 % — AB (ref 35.0–47.0)
HGB: 9.5 g/dL — ABNORMAL LOW (ref 12.0–16.0)
LYMPHS PCT: 34.5 %
Lymphocyte #: 1.9 10*3/uL (ref 1.0–3.6)
MCH: 31.4 pg (ref 26.0–34.0)
MCHC: 32.1 g/dL (ref 32.0–36.0)
MCV: 98 fL (ref 80–100)
MONOS PCT: 17.2 %
Monocyte #: 1 x10 3/mm — ABNORMAL HIGH (ref 0.2–0.9)
NEUTROS ABS: 2.6 10*3/uL (ref 1.4–6.5)
NEUTROS PCT: 47 %
Platelet: 221 10*3/uL (ref 150–440)
RBC: 3.02 10*6/uL — AB (ref 3.80–5.20)
RDW: 13.7 % (ref 11.5–14.5)
WBC: 5.6 10*3/uL (ref 3.6–11.0)

## 2014-09-10 LAB — BASIC METABOLIC PANEL
ANION GAP: 6 — AB (ref 7–16)
BUN: 27 mg/dL — AB (ref 7–18)
CHLORIDE: 106 mmol/L (ref 98–107)
CO2: 29 mmol/L (ref 21–32)
Calcium, Total: 7.7 mg/dL — ABNORMAL LOW (ref 8.5–10.1)
Creatinine: 1.59 mg/dL — ABNORMAL HIGH (ref 0.60–1.30)
EGFR (African American): 39 — ABNORMAL LOW
EGFR (Non-African Amer.): 32 — ABNORMAL LOW
Glucose: 84 mg/dL (ref 65–99)
OSMOLALITY: 286 (ref 275–301)
Potassium: 3.8 mmol/L (ref 3.5–5.1)
Sodium: 141 mmol/L (ref 136–145)

## 2014-09-15 ENCOUNTER — Ambulatory Visit: Payer: Medicare Other | Admitting: Cardiovascular Disease

## 2014-09-16 ENCOUNTER — Inpatient Hospital Stay: Payer: Self-pay | Admitting: Specialist

## 2014-09-16 ENCOUNTER — Telehealth: Payer: Self-pay

## 2014-09-16 DIAGNOSIS — J189 Pneumonia, unspecified organism: Secondary | ICD-10-CM | POA: Diagnosis not present

## 2014-09-16 DIAGNOSIS — Z79899 Other long term (current) drug therapy: Secondary | ICD-10-CM | POA: Diagnosis not present

## 2014-09-16 DIAGNOSIS — Z85828 Personal history of other malignant neoplasm of skin: Secondary | ICD-10-CM | POA: Diagnosis not present

## 2014-09-16 DIAGNOSIS — J479 Bronchiectasis, uncomplicated: Secondary | ICD-10-CM | POA: Diagnosis not present

## 2014-09-16 DIAGNOSIS — N390 Urinary tract infection, site not specified: Secondary | ICD-10-CM | POA: Diagnosis not present

## 2014-09-16 DIAGNOSIS — Z7951 Long term (current) use of inhaled steroids: Secondary | ICD-10-CM | POA: Diagnosis not present

## 2014-09-16 DIAGNOSIS — J90 Pleural effusion, not elsewhere classified: Secondary | ICD-10-CM | POA: Diagnosis not present

## 2014-09-16 DIAGNOSIS — I10 Essential (primary) hypertension: Secondary | ICD-10-CM | POA: Diagnosis not present

## 2014-09-16 DIAGNOSIS — R0902 Hypoxemia: Secondary | ICD-10-CM | POA: Diagnosis not present

## 2014-09-16 DIAGNOSIS — I5033 Acute on chronic diastolic (congestive) heart failure: Secondary | ICD-10-CM | POA: Diagnosis not present

## 2014-09-16 DIAGNOSIS — R0602 Shortness of breath: Secondary | ICD-10-CM | POA: Diagnosis not present

## 2014-09-16 DIAGNOSIS — Z853 Personal history of malignant neoplasm of breast: Secondary | ICD-10-CM | POA: Diagnosis not present

## 2014-09-16 DIAGNOSIS — B9689 Other specified bacterial agents as the cause of diseases classified elsewhere: Secondary | ICD-10-CM | POA: Diagnosis not present

## 2014-09-16 DIAGNOSIS — Z79810 Long term (current) use of selective estrogen receptor modulators (SERMs): Secondary | ICD-10-CM | POA: Diagnosis not present

## 2014-09-16 DIAGNOSIS — A499 Bacterial infection, unspecified: Secondary | ICD-10-CM | POA: Diagnosis not present

## 2014-09-16 DIAGNOSIS — Z809 Family history of malignant neoplasm, unspecified: Secondary | ICD-10-CM | POA: Diagnosis not present

## 2014-09-16 DIAGNOSIS — Z7902 Long term (current) use of antithrombotics/antiplatelets: Secondary | ICD-10-CM | POA: Diagnosis not present

## 2014-09-16 DIAGNOSIS — I509 Heart failure, unspecified: Secondary | ICD-10-CM | POA: Diagnosis not present

## 2014-09-16 DIAGNOSIS — I482 Chronic atrial fibrillation: Secondary | ICD-10-CM | POA: Diagnosis not present

## 2014-09-16 DIAGNOSIS — J811 Chronic pulmonary edema: Secondary | ICD-10-CM | POA: Diagnosis not present

## 2014-09-16 DIAGNOSIS — G9349 Other encephalopathy: Secondary | ICD-10-CM | POA: Diagnosis not present

## 2014-09-16 LAB — URINALYSIS, COMPLETE
BILIRUBIN, UR: NEGATIVE
Glucose,UR: NEGATIVE mg/dL (ref 0–75)
Ketone: NEGATIVE
NITRITE: POSITIVE
PH: 5 (ref 4.5–8.0)
Protein: 100
SQUAMOUS EPITHELIAL: NONE SEEN
Specific Gravity: 1.014 (ref 1.003–1.030)
WBC UR: 725 /HPF (ref 0–5)

## 2014-09-16 LAB — BASIC METABOLIC PANEL
ANION GAP: 5 — AB (ref 7–16)
BUN: 19 mg/dL — ABNORMAL HIGH (ref 7–18)
CALCIUM: 8.1 mg/dL — AB (ref 8.5–10.1)
Chloride: 104 mmol/L (ref 98–107)
Co2: 30 mmol/L (ref 21–32)
Creatinine: 1.58 mg/dL — ABNORMAL HIGH (ref 0.60–1.30)
EGFR (African American): 39 — ABNORMAL LOW
GFR CALC NON AF AMER: 32 — AB
GLUCOSE: 104 mg/dL — AB (ref 65–99)
OSMOLALITY: 280 (ref 275–301)
Potassium: 4 mmol/L (ref 3.5–5.1)
Sodium: 139 mmol/L (ref 136–145)

## 2014-09-16 LAB — CBC WITH DIFFERENTIAL/PLATELET
BASOS ABS: 0.1 10*3/uL (ref 0.0–0.1)
Basophil %: 0.7 %
Eosinophil #: 0 10*3/uL (ref 0.0–0.7)
Eosinophil %: 0.1 %
HCT: 30.7 % — ABNORMAL LOW (ref 35.0–47.0)
HGB: 10 g/dL — ABNORMAL LOW (ref 12.0–16.0)
Lymphocyte #: 1.5 10*3/uL (ref 1.0–3.6)
Lymphocyte %: 15.8 %
MCH: 31.7 pg (ref 26.0–34.0)
MCHC: 32.6 g/dL (ref 32.0–36.0)
MCV: 97 fL (ref 80–100)
MONOS PCT: 8 %
Monocyte #: 0.8 x10 3/mm (ref 0.2–0.9)
NEUTROS ABS: 7.1 10*3/uL — AB (ref 1.4–6.5)
Neutrophil %: 75.4 %
Platelet: 272 10*3/uL (ref 150–440)
RBC: 3.15 10*6/uL — AB (ref 3.80–5.20)
RDW: 13.8 % (ref 11.5–14.5)
WBC: 9.5 10*3/uL (ref 3.6–11.0)

## 2014-09-16 LAB — TROPONIN I: Troponin-I: <0.02 ng/mL

## 2014-09-16 LAB — PROTIME-INR
INR: 2.7
PROTHROMBIN TIME: 28.2 s — AB (ref 11.5–14.7)

## 2014-09-16 LAB — PRO B NATRIURETIC PEPTIDE: B-Type Natriuretic Peptide: 12267 pg/mL — ABNORMAL HIGH (ref 0–450)

## 2014-09-16 NOTE — Telephone Encounter (Signed)
Pt is in room 102.  Admitted for pneumonia & UTI.

## 2014-09-16 NOTE — Telephone Encounter (Signed)
Pt grandaughter called, just wanted to let Dr. Rockey Situ know pt is in [facility] with CHF.

## 2014-09-17 LAB — CBC WITH DIFFERENTIAL/PLATELET
Basophil #: 0 10*3/uL (ref 0.0–0.1)
Basophil %: 0.5 %
EOS PCT: 0.6 %
Eosinophil #: 0 10*3/uL (ref 0.0–0.7)
HCT: 27.7 % — AB (ref 35.0–47.0)
HGB: 9.1 g/dL — AB (ref 12.0–16.0)
LYMPHS ABS: 2.1 10*3/uL (ref 1.0–3.6)
Lymphocyte %: 30.6 %
MCH: 31.8 pg (ref 26.0–34.0)
MCHC: 32.9 g/dL (ref 32.0–36.0)
MCV: 97 fL (ref 80–100)
MONO ABS: 0.9 x10 3/mm (ref 0.2–0.9)
MONOS PCT: 13.4 %
NEUTROS ABS: 3.7 10*3/uL (ref 1.4–6.5)
Neutrophil %: 54.9 %
PLATELETS: 238 10*3/uL (ref 150–440)
RBC: 2.87 10*6/uL — ABNORMAL LOW (ref 3.80–5.20)
RDW: 13.6 % (ref 11.5–14.5)
WBC: 6.8 10*3/uL (ref 3.6–11.0)

## 2014-09-17 LAB — BASIC METABOLIC PANEL
Anion Gap: 3 — ABNORMAL LOW (ref 7–16)
BUN: 22 mg/dL — ABNORMAL HIGH (ref 7–18)
CALCIUM: 8.1 mg/dL — AB (ref 8.5–10.1)
CREATININE: 1.57 mg/dL — AB (ref 0.60–1.30)
Chloride: 101 mmol/L (ref 98–107)
Co2: 34 mmol/L — ABNORMAL HIGH (ref 21–32)
EGFR (Non-African Amer.): 33 — ABNORMAL LOW
GFR CALC AF AMER: 39 — AB
Glucose: 83 mg/dL (ref 65–99)
Osmolality: 278 (ref 275–301)
POTASSIUM: 3.5 mmol/L (ref 3.5–5.1)
SODIUM: 138 mmol/L (ref 136–145)

## 2014-09-18 LAB — PROTIME-INR
INR: 2.1
PROTHROMBIN TIME: 23.4 s — AB (ref 11.5–14.7)

## 2014-09-18 LAB — URINE CULTURE

## 2014-09-20 DIAGNOSIS — N183 Chronic kidney disease, stage 3 (moderate): Secondary | ICD-10-CM | POA: Diagnosis not present

## 2014-09-20 DIAGNOSIS — I10 Essential (primary) hypertension: Secondary | ICD-10-CM | POA: Diagnosis not present

## 2014-09-20 DIAGNOSIS — M830 Puerperal osteomalacia: Secondary | ICD-10-CM | POA: Diagnosis not present

## 2014-09-20 DIAGNOSIS — I4891 Unspecified atrial fibrillation: Secondary | ICD-10-CM | POA: Diagnosis not present

## 2014-09-21 LAB — CULTURE, BLOOD (SINGLE)

## 2014-09-27 DIAGNOSIS — I4891 Unspecified atrial fibrillation: Secondary | ICD-10-CM | POA: Diagnosis not present

## 2014-10-13 ENCOUNTER — Ambulatory Visit (INDEPENDENT_AMBULATORY_CARE_PROVIDER_SITE_OTHER): Payer: Medicaid Other | Admitting: Cardiovascular Disease

## 2014-10-13 ENCOUNTER — Encounter: Payer: Self-pay | Admitting: Cardiovascular Disease

## 2014-10-13 ENCOUNTER — Ambulatory Visit (INDEPENDENT_AMBULATORY_CARE_PROVIDER_SITE_OTHER): Payer: PRIVATE HEALTH INSURANCE

## 2014-10-13 VITALS — BP 132/62 | HR 48 | Ht 62.0 in | Wt 130.0 lb

## 2014-10-13 DIAGNOSIS — Z7901 Long term (current) use of anticoagulants: Secondary | ICD-10-CM

## 2014-10-13 DIAGNOSIS — I10 Essential (primary) hypertension: Secondary | ICD-10-CM | POA: Diagnosis not present

## 2014-10-13 DIAGNOSIS — D539 Nutritional anemia, unspecified: Secondary | ICD-10-CM | POA: Diagnosis not present

## 2014-10-13 DIAGNOSIS — I4891 Unspecified atrial fibrillation: Secondary | ICD-10-CM

## 2014-10-13 DIAGNOSIS — D649 Anemia, unspecified: Secondary | ICD-10-CM | POA: Insufficient documentation

## 2014-10-13 DIAGNOSIS — I5032 Chronic diastolic (congestive) heart failure: Secondary | ICD-10-CM | POA: Diagnosis not present

## 2014-10-13 LAB — POCT INR: INR: 1.6

## 2014-10-13 MED ORDER — DILTIAZEM HCL ER COATED BEADS 120 MG PO CP24: 120.0000 mg | ORAL_CAPSULE | Freq: Every day | ORAL | Status: AC

## 2014-10-13 NOTE — Assessment & Plan Note (Signed)
She continues to be in atrial fibrillation. Heart rate is slow. We will hold the digoxin, decrease the diltiazem down to 120 mg daily. Suggested that the family monitor her heart rate at home

## 2014-10-13 NOTE — Progress Notes (Signed)
Patient ID: Jill Ellison, female    DOB: September 25, 1916, 81 y.o.   MRN: [id number]  HPI Comments: Jill Ellison is a very pleasant 81 year old woman with a history of chronic back pain,  history of  breast cancer, s/p mastectomy on the left with Dr. Tollie Pizza, diagnosed with atrial fibrillation prior to her surgery, on warfarin. She presents for routine followup of her atrial fibrillation. She has refused cardioversion in the past as she feels well.   In follow-up today, daughter presents with her. She reports that she had a possible seizure over Christmas 2015. Lab work in the hospital showed mild dehydration, anemia with hematocrit 27, creatinine 1.57 with BUN 22, calcium 81. She was instructed to hold her Lasix, given fluids.  Family also reports that she had pneumonia 3 days later requiring antibiotics. She has improved, cough much better, no productive sputum.  family reports that she developed lower extremity edema after discharge from the hospital and restarted Lasix. She takes two potassium daily with her Lasix Family does report significant weight loss over the past year  Family reports that she is no longer walking, legs are too weak, needs assistance. She has been taking digoxin every other day  EKG on today's visit shows atrial fibrillation with rate 48 bpm, no significant ST or T-wave changes. Repeat EKG with heart rate 53 bpm    No Known Allergies  Outpatient Encounter Prescriptions as of 10/13/2014  Medication Sig  . calcium-vitamin D (OSCAL WITH D) 500-200 MG-UNIT per tablet Take 1 tablet by mouth 2 (two) times daily.    . Cholecalciferol (VITAMIN D3) 1000 UNITS CAPS Take 1 capsule by mouth daily.    . fexofenadine (ALLEGRA) 180 MG tablet Take 180 mg by mouth daily.    . furosemide (LASIX) 20 MG tablet TAKE 1 TABLET EVERY DAY  . HYDROcodone-acetaminophen (VICODIN) 5-500 MG per tablet Take 1 tablet by mouth every 6 (six) hours as needed.    [name] KLOR-CON M20 20 MEQ tablet TAKE 1  TABLET BY MOUTH EVERY DAY  . letrozole (FEMARA) 2.5 MG tablet Take 2.5 mg by mouth daily.    [name] levalbuterol (XOPENEX HFA) 45 MCG/ACT inhaler Inhale 1-2 puffs into the lungs every 4 (four) hours as needed.    [name] lisinopril (PRINIVIL,ZESTRIL) 20 MG tablet TAKE 1 TABLET BY MOUTH DAILY  . Magnesium 250 MG TABS Take 2 tablets by mouth daily.   . Multiple Vitamin (MULTIVITAMIN) tablet Take 1 tablet by mouth daily.    [name] warfarin (COUMADIN) 2 MG tablet TAKE AS DIRECTED BY ANTICOAGULATION CLINIC  .  digoxin (LANOXIN) 0.125 MG tablet Take 1 tablet (125 mcg total) by mouth every other day.  .  diltiazem (CARDIZEM CD) 240 MG 24 hr capsule TAKE 1 CAPSULE (240 MG TOTAL) BY MOUTH DAILY.  . [DISCONTINUED] lisinopril (PRINIVIL,ZESTRIL) 20 MG tablet Take 1 tablet (20 mg total) by mouth daily. (Patient not taking: Reported on 10/13/2014)  . [DISCONTINUED] warfarin (COUMADIN) 2 MG tablet Take as directed by Anticoagulation Clinic (Patient not taking: Reported on 10/13/2014)    Past Medical History  Diagnosis Date  . Swelling of ankle   . SOB (shortness of breath)   . Hypertension   . Asthma   . Arthritis   . Atrial fibrillation 2011  . Cancer 2011    Left breast  . Cancer of leg   . Malignant neoplasm of upper-outer quadrant of female breast     left   . Memory loss   . Dementia   .  GERD (gastroesophageal reflux disease)   . Hyperlipidemia   . Anemia   . Iron deficiency   . History of colonic polyps   . Lumbago     Past Surgical History  Procedure Laterality Date  . Skin cancer excision  2008    right leg  . Mastectomy    . Colonoscopy  2010  . Breast surgery Left 03/2010    mastectomy  . Breast biopsy Left 2009    Social History  reports that she has never smoked. She has never used smokeless tobacco. She reports that she does not drink alcohol or use illicit drugs.  Family History family history includes Cancer in her brother, father, and mother; Coronary artery disease in her brother;  Heart attack in her brother; Hypertension in her brother; Ovarian cancer in her sister.  Review of Systems  Constitutional: Negative.   Respiratory: Negative.   Cardiovascular: Negative.   Gastrointestinal: Negative.   Musculoskeletal: Negative.   Neurological: Negative.   Hematological: Negative.   Psychiatric/Behavioral: Negative.   All other systems reviewed and are negative.   BP 132/62 mmHg  Pulse 48  Ht 5\' 2"  (1.575 m)  Wt 130 lb (58.968 kg)  BMI 23.77 kg/m2  Physical Exam  Constitutional: She is oriented to person, place, and time.  Thin, presenting in a wheelchair, unable to stand  HENT:  Head: Normocephalic.  Nose: Nose normal.  Mouth/Throat: Oropharynx is clear and moist.  Eyes: Conjunctivae are normal. Pupils are equal, round, and reactive to light.  Neck: Normal range of motion. Neck supple. No JVD present.  Cardiovascular: Normal rate, regular rhythm, S1 normal, S2 normal, normal heart sounds and intact distal pulses.  Exam reveals no gallop and no friction rub.   No murmur heard. Pulmonary/Chest: Effort normal and breath sounds normal. No respiratory distress. She has no wheezes. She has no rales. She exhibits no tenderness.  Abdominal: Soft. Bowel sounds are normal. She exhibits no distension. There is no tenderness.  Musculoskeletal: Normal range of motion. She exhibits no edema or tenderness.  Lymphadenopathy:    She has no cervical adenopathy.  Neurological: She is alert and oriented to person, place, and time. Coordination normal.  Skin: Skin is warm and dry. No rash noted. No erythema.  Psychiatric: She has a normal mood and affect. Her behavior is normal. Judgment and thought content normal.    Assessment and Plan  Nursing note and vitals reviewed.

## 2014-10-13 NOTE — Patient Instructions (Signed)
Please hold the digoxin Please decrease the diltiazem down to 120 mg daily  Start iron pills twice a day  Please take lasix every other day, with one potassium daily Extra lasix as needed for ankle swelling   Please call if the heart rate continues to run low  Please call us if you have new issues that need to be addressed before your next appt.  Your physician wants you to follow-up in: 3 months.  You will receive a reminder letter in the mail two months in advance. If you don't receive a letter, please call our office to schedule the follow-up appointment.

## 2014-10-13 NOTE — Assessment & Plan Note (Signed)
Medication changes as above. Suggested he monitor the blood pressure

## 2014-10-13 NOTE — Assessment & Plan Note (Signed)
Recent hematocrit of 27.7, hemoglobin 9. She has had recent weight loss from anorexia. Recommended she add iron to her diet, try to increase her calorie intake

## 2014-10-13 NOTE — Assessment & Plan Note (Signed)
Recent dehydration on lab work in the hospital. Recommended they decrease her Lasix back to every other day, encourage her fluid intake. Currently taking Lasix daily

## 2014-11-10 ENCOUNTER — Ambulatory Visit (INDEPENDENT_AMBULATORY_CARE_PROVIDER_SITE_OTHER): Payer: PRIVATE HEALTH INSURANCE | Admitting: *Deleted

## 2014-11-10 DIAGNOSIS — Z7901 Long term (current) use of anticoagulants: Secondary | ICD-10-CM

## 2014-11-10 DIAGNOSIS — I4891 Unspecified atrial fibrillation: Secondary | ICD-10-CM | POA: Diagnosis not present

## 2014-11-10 LAB — POCT INR: INR: 2.3

## 2014-12-08 ENCOUNTER — Ambulatory Visit (INDEPENDENT_AMBULATORY_CARE_PROVIDER_SITE_OTHER): Payer: PRIVATE HEALTH INSURANCE

## 2014-12-08 DIAGNOSIS — Z7901 Long term (current) use of anticoagulants: Secondary | ICD-10-CM

## 2014-12-08 DIAGNOSIS — I4891 Unspecified atrial fibrillation: Secondary | ICD-10-CM

## 2014-12-08 LAB — POCT INR: INR: 1.8

## 2014-12-31 NOTE — Consult Note (Signed)
General Aspect Jill Ellison is a very pleasant 81 year old woman with a history of chronic back pain,  history of lumpectomy identifying breast cancer, mastectomy on the left with Dr. Tollie Pizza,  diagnosed with atrial fibrillation prior to her surgery and was not started on warfarin as her surgery was pending, started on rate controlling medications including diltiazem, digoxin with Lasix. following surgery, she was started on warfarin. She has refused cardioversion in the past as she feels well. She presents after a fall with hip fx.   fall two days ago after getting up from her rocking chair,  foot caught an area rug under the chair and fell onto her left hip.    Prior to her recent fall, based on her last clinic visit in 03/2012, She denied chest pain, shortness of breath. no significant edema in her lower extremities.She denies any periods of tachycardia and is able to ambulate without any falls and has no significant gait instability. She continues to have problems with her back though is very active.     EKG shows atrial fibrillation with rate of 71 with nonspecific ST abnormality in V4 through V6   Present Illness . SOCIAL HISTORY: Nonsmoker, no alcohol use. No drug use. Currently lives with her granddaughter.   FAMILY HISTORY: Positive for GU cancer in mother and diabetes.   Physical Exam:  GEN well developed, well nourished, no acute distress   HEENT red conjunctivae   NECK supple  No masses    RESP normal resp effort  clear BS    CARD Irregular rate and rhythm  No murmur    ABD denies tenderness  soft    LYMPH negative neck   EXTR negative edema   SKIN normal to palpation   NEURO motor/sensory function intact, left leg in traction   PSYCH alert, A+O to time, place, person, good insight   Review of Systems:  Subjective/Chief Complaint Left hip pain   General: No Complaints    Skin: No Complaints    ENT: No Complaints    Eyes: No Complaints    Neck: No  Complaints    Respiratory: No Complaints    Cardiovascular: No Complaints    Gastrointestinal: No Complaints    Genitourinary: No Complaints    Vascular: No Complaints    Musculoskeletal: Muscle or joint swelling  left hip pain    Neurologic: No Complaints    Hematologic: No Complaints    Endocrine: No Complaints    Psychiatric: No Complaints    Review of Systems: All other systems were reviewed and found to be negative    Medications/Allergies Reviewed Medications/Allergies reviewed      Breast cancer:    Arthritis:    Anemia:    Hypertension:    Hard of Hearing:    Mastectomy-left:    Excision Cancer right leg:        Admit Diagnosis:   CLOSED TRANSCERVICAL FRACTURE: Onset Date: 15-Dec-2012, Status: Active, Description: CLOSED TRANSCERVICAL FRACTURE  Home Medications: Medication Instructions Status  acetaminophen-HYDROcodone 325 mg-5 mg oral tablet 1 tab(s) orally 2 times a day as needed  for pain. Active  docusate sodium sodium 100 mg oral capsule 1 cap(s) orally once a day as needed. Active  furosemide 20 mg oral tablet 1 tab(s) orally once a day Active  magnesium 1 tab(s) orally once a day Active  Super B Complex 1 tab(s) orally once a day Active  Vitamin D3 1000 intl units oral capsule 1 cap(s) orally once a day Active  Lanoxin 125 mcg (0.125 mg) oral tablet 1 tab(s) orally every other day Active  diltiazem 24 hour extended release 240 mg/24 hours oral capsule, extended release 1 cap(s) orally once a day Active  Klor-Con M20 20 mEq oral tablet, extended release 1 tab(s) orally once a day Active  lisinopril 20 mg oral tablet 1 tab(s) orally once a day Active  letrozole 2.5 mg oral tablet 1 tab(s) orally once a day Active  ZyrTEC 10 mg oral tablet 1 tab(s) orally once a day Active  warfarin 2 mg oral tablet take orally as directed. Active  warfarin 3 mg oral tablet take orally as directed. Active   Lab Results:  Routine Coag:  08-Apr-14 04:20    Prothrombin  17.9  INR 1.5 (INR reference interval applies to patients on anticoagulant therapy. A single INR therapeutic range for coumarins is not optimal for all indications; however, the suggested range for most indications is 2.0 - 3.0. Exceptions to the INR Reference Range may include: Prosthetic heart valves, acute myocardial infarction, prevention of myocardial infarction, and combinations of aspirin and anticoagulant. The need for a higher or lower target INR must be assessed individually. Reference: The Pharmacology and Management of the Vitamin K  antagonists: the seventh ACCP Conference on Antithrombotic and Thrombolytic Therapy. DTOIZ.1245 Sept:126 (3suppl): [id number]. A HCT value >55% may artifactually increase the PT.  In one study,  the increase was an average of 25%. Reference:  "Effect on Routine and Special Coagulation Testing Values of Citrate Anticoagulant Adjustment in Patients with High HCT Values." American Journal of Clinical Pathology 2006;126:400-405.)  Routine Hem:  08-Apr-14 04:20   WBC (CBC) 9.9  RBC (CBC)  2.76  Hemoglobin (CBC)  8.9  Hematocrit (CBC)  26.9  Platelet Count (CBC) 187  MCV 98  MCH 32.3  MCHC 33.2  RDW 14.4  Neutrophil % 57.7  Lymphocyte % 27.1  Monocyte % 14.0  Eosinophil % 0.6  Basophil % 0.6  Neutrophil # 5.7  Lymphocyte # 2.7  Monocyte #  1.4  Eosinophil # 0.1  Basophil # 0.1 (Result(s) reported on 16 Dec 2012 at 05:55AM.)   EKG:  Interpretation EKG shows atrial fibrillation, rate 73 bpm   Radiology Results: XRay:    06-Apr-14 15:06, Chest 1 View AP or PA  Chest 1 View AP or PA   REASON FOR EXAM:    fall  COMMENTS:       PROCEDURE: DXR - DXR CHEST 1 VIEWAP OR PA  - Dec 14 2012  3:06PM     RESULT: Comparison made to prior study of 06/08/2010. Cardiomegaly. Lungs   clear.    IMPRESSION:  Cardiomegaly. No CHF. Stable chest from 06/08/2010.        Verified By: Osa Craver, M.D., MD    06-Apr-14 18:58, Hip  Left One View  Hip Left One View   REASON FOR EXAM:    traction view to better characterize evidence of    prox femur fx  COMMENTS:       PROCEDURE: DXR - DXR HIP LEFT ONE VIEW  - Dec 14 2012  6:58PM     RESULT: Comparison: Earlier same day.    Findings:  There is an impacted fractureof the base of the left femoral neck   similar to prior. There is some extension into the greater trochanter, as   seen on the prior CT.     Small lucency in the proximal femoral diaphysis is nonspecific.  IMPRESSION:   Proximal  left femur fracture, as described above.    Dictation site: 2        Verified By: Gregor Hams, M.D., MD    No Known Allergies:   Vital Signs/Nurse's Notes: **Vital Signs.:   08-Apr-14 05:55  Vital Signs Type Q 4hr  Temperature Temperature (F) 100.6  Celsius 38.1  Temperature Source oral  Pulse Pulse 81  Respirations Respirations 20  Systolic BP Systolic BP 374  Diastolic BP (mmHg) Diastolic BP (mmHg) 63  Mean BP 86  Pulse Ox % Pulse Ox % 97  Pulse Ox Activity Level  At rest  Oxygen Delivery 2L    Impression Jill Ellison is a very pleasant 81 year old woman with a history of chronic back pain,   breast cancer, mastectomy on the left,  chronic atrial fibrillation on warfarin  following surgery,   refused cardioversion in the past,  presenting after a fall with hip fx.   1) Atrial fibrillation; Warfarin on hold, possible surgery today INR recheck mid day --If no surgery today, could start on heparin infusion (no bolus) and turn off several hours before surgery tomorrow. --After surgery, could use lovenox/warfarin bridge or xarelto/pradaxa  2) Left hip fracture Possible surgery today  3) Breast CA, s/p mastectomy   Electronic Signatures: Ida Rogue (MD)  (Signed 08-Apr-14 08:18)  Authored: General Aspect/Present Illness, History and Physical Exam, Review of System, Past Medical History, Health Issues, Home Medications, Labs, EKG , Radiology,  Allergies, Vital Signs/Nurse's Notes, Impression/Plan   Last Updated: 08-Apr-14 08:18 by Ida Rogue (MD)

## 2014-12-31 NOTE — H&P (Signed)
PATIENT NAME:  Jill Ellison, Jill Ellison MR#:  [id number] DATE OF BIRTH:  Aug 15, 1917  DATE OF ADMISSION:  12/14/2012  CHIEF COMPLAINT: Left hip pain.   HISTORY OF PRESENT ILLNESS:  The patient is a 81 year old female who sustained a fall today in the area of one of her area rugs and fell onto her left hip. She denied any history of antecedent hip pain. She notes intermittent sharp left hip pain. She was brought to [facility] where she was diagnosed with a comminuted proximal femur fracture for which I was asked to provide definitive consultation.   REVIEW OF SYSTEMS:  CARDIAC: No chest pain.  RESPIRATORY: No shortness of breath.  NEUROLOGIC: No loss of consciousness.  SKIN: Mild abrasion over the anterior aspect of the left tibia which she sustained yesterday with a fall from a ladder.  GASTROINTESTINAL: No nausea, vomiting.  GENITOURINARY: No dysuria.  HEENT: No ear, nose or throat pain. No visual changes.  ENDOCRINE: Apparent history of osteoporosis.  MUSCULOSKELETAL: Complains of left hip pain. No complaints of antecedent left hip pain.  Chronic back pain.  GENERAL: A history of breast cancer, status post left mastectomy from several years ago. No evidence of spread of the cancer per the family who attended her this evening, including her son.    all other review of systems are negative  CURRENT MEDICAL HISTORY: Includes a history of breast cancer, atrial fibrillation, hypertension, mild dementia, history of CHF.   PAST SURGICAL HISTORY: Left mastectomy and skin cancer removal on the left arm and other multiple sites.   ALLERGIES: No known drug allergies.   MEDICATION: Coumadin, Digoxin, diltiazem, Lasix, Norco 5/325, KCL, metoprolol and lisinopril.  SOCIAL HISTORY: Nonsmoker, no alcohol use. No drug use. Currently lives with her granddaughter.   FAMILY HISTORY: Positive for GU cancer in mother and diabetes.   PHYSICAL EXAMINATION:  VITAL SIGNS: On presentation to  the Emergency Department, temperature of 98, pulse of 87, respiratory rate of 18, 96% room air saturation oxygen. Blood pressure 191/75.  PSYCHIATRIC: Mood and affect appropriate. GENERAL: Pleasant, alert elderly female presenting with her family.  HEENT: Normocephalic, atraumatic. Mild abrasions over the left orbital region. Oral mucosal membranes are slightly moist.  LUNGS: Scattered rhonchi bilaterally.  HEART: Regular rate, irregular rhythm.  ABDOMEN: Soft, nontender, nondistended. Positive bowel sounds.  SKIN: Warm and intact over the left hip region. There is a mild superficial abrasion over the pretibial area of the left lower extremity. VASCULAR EXAMINATION: Shows 2+ posterior tibialis and 1+ dorsalis pedis pulse of the left lower extremity.  LYMPHATIC: No swelling at the left hip.  NEUROLOGIC: Motor and light touch sensation examination intact in the left lower extremity.  MUSCULOSKELETAL: Bilateral upper extremities are nontender to palpation and without deformity.  Right lower extremity evaluation: No deformity, no tenderness to palpation.  Left lower extremity evaluation: Shows a shortened externally rotated lower extremity with focal tenderness to palpation over the greater trochanteric intertrochanteric region.  The remainder of the ipsilateral left lower extremity reveals no deformity or tenderness to palpation. There is marked pain of the left lower extremity with even slight log roll maneuver.   LABORATORY, DIAGNOSTIC AND RADIOLOGICAL DATA:  Radiographic evaluation, including AP pelvis and 2-view left hip shows an apparent greater trochanteric fracture with a displaced femoral neck fracture as well. Traction evaluation shows no differences compared to the AP other than reduction of the trochanteric portion of the fracture. The patient was unable to relax enough to clearly see whether  or not the fracture pattern was basicervical.  CT scan of the left hip shows a comminuted high  intertrochanteric fracture with a comminuted greater posterior greater trochanteric fracture and nondisplaced lesser trochanter fracture with basicervical cervical fracture with apparent extension into the mid cervical region of the femoral neck.   Complete metabolic panel showing mildly elevated potassium at 5.2. Digoxin level at 0.48.  CBC showing low hemoglobin/hematocrit at 11.93/ 35.8 with 240,000 platelets. Coagulation shows A positive blood type with PT-INR 22.7 and  2.0.  Urinalysis is unremarkable .   IMPRESSION: Closed displaced left proximal femur fracture with a combined intertrochanteric and femoral neck pattern.   PLAN: Buck's traction, TEDs and sequentials.  Internal medicine consultation which has deemed her to be medically optimized for surgical intervention once her INR is 1.5 or less. Proceed with surgery once the INR is less than 1.5. I have discussed this case with Dr. Rudene Christians, who will assume care of him tomorrow morning at 5:00 a.m. I discussed the plan with the family, which could include cephalomedullary nail placement or standard or calcar-replacing hemiarthroplasty. They appear to understand that the surgical plan may change depending on the overall reduction of the fracture and the apparent fracture pattern intraoperatively. We will also proceed with decubitus precautions, and the internal medicine service has performed a consultation and also started her on vitamin K.  We will also proceed with new labs in the morning.    ____________________________ Maebelle Munroe, MD jfs:cb D: 12/14/2012 20:11:38 ET T: 12/14/2012 20:32:24 ET JOB#: [id number]  cc: Maebelle Munroe, MD, <Dictator> Maebelle Munroe MD ELECTRONICALLY SIGNED 12/14/2012 23:05

## 2014-12-31 NOTE — Discharge Summary (Signed)
PATIENT NAME:  Jill Ellison, Jill Ellison MR#:  [id number] DATE OF BIRTH:  07-12-1917  DATE OF ADMISSION:  12/14/2012 DATE OF DISCHARGE:  12-15-2012  ADMITTING DIAGNOSIS: Left comminuted displaced proximal femur fracture.   DISCHARGE DIAGNOSIS: Left comminuted displaced proximal femur fracture, basilar neck, high intertrochanteric.   PROCEDURE: Left hip hemiarthroplasty.   ANESTHESIA: Spinal.   SURGEON: Laurene Footman, M.D.   ESTIMATED BLOOD LOSS: 250 mL.   COMPLICATIONS: None.   SPECIMENS: Femoral head.   CONDITION: To recovery room stable.   HISTORY: The patient is a 81 year old female who sustained a fall on 12/14/2012 when she tripped on an area of rugs and fell onto her left hip. She denied any history of antecedent hip pain. She noted intermittent sharp left hip pain. She was brought to [facility] where she was diagnosed with a comminuted proximal femur fracture for which orthopedics was consulted.   PHYSICAL EXAMINATION:  VITAL SIGNS:  On presentation to the Emergency Department, temperature 98, pulse of 87, respiratory rate of 18, 96% on room air and blood pressure was 191/75.  PSYCHIATRIC: Mood and affect appropriate.  GENERAL: Pleasant, alert elderly female presenting with her family.  HEENT: Normocephalic, atraumatic. Mild abrasions of the left orbital region. Oral mucosal membranes are slightly moist.  LUNGS: Scattered rhonchi bilaterally.  HEART: Regular rate and irregular rhythm.  ABDOMEN: Soft, nontender and nondistended. Positive bowel sounds.  SKIN: Warm and intact over the left hip region. There is mild superficial abrasion over the pretibial area of the left lower extremity. VASCULAR:  Shows 2+ posterior tibialis and 1+ dorsalis pedis pulse of the left lower extremity.  LYMPHATIC: No swelling of the left hip.  NEUROLOGIC: Motor and light touch sensation examination intact in the left lower extremity.  MUSCULOSKELETAL: Bilateral upper extremities  are nontender to palpation and without deformity. Right lower extremity evaluation no deformity. No tenderness to palpation. Left lower extremity evaluation shows a shortened and externally rotated lower extremity with focal tenderness to palpation over the greater trochanteric, intertrochanteric region. The remainder of the ipsilateral left lower extremity reveals no deformity or tenderness to palpation. There is marked pain of the left lower extremity with even slight logroll maneuver.   HOSPITAL COURSE: The patient was admitted to the hospital on 12/14/2012 for left hip fracture. INR was found to be elevated and medicine was consulted for preop management. Due to INR elevation, Coumadin was held and vitamin K was given. On 12/15/2012, INR remained elevated at 2.1 so vitamin K was given again. Throughout the first 24 hours traction was maintained on the left lower extremity. On 12/16/2012,  INR was down to 1.5 and cardiology was consulted and the patient was given clearance for surgery. She underwent a left hip hemiarthroplasty on this day. On 12/17/2012, hemoglobin was 7.8 and she was transfused 1 unit of packed red blood cells and she was started on Lovenox to bridge to her normal Coumadin dose.  On 12/18/2012, the patient was doing well. She did have slow progress with physical therapy. INR remained less than therapeutic, at 1.5. Vital signs remained stable. On postop day 3, 12-15-2012, the patient was stable and ready for discharge to a rehab facility. Vital signs were stable and she had a bowel movement, but INR was still not therapeutic at 1.5.   CONDITION AT DISCHARGE: Stable.   DISCHARGE INSTRUCTIONS: The patient may gradually increase weight-bearing on the effected extremity. She is to elevate the effected foot or leg on 1 or 2 pillows  with the foot higher than the knee. Thigh-high TED hose on both legs and remove 1 hour per 8 hour shift. She needs to use incentive spirometry every 1 hour while  awake and encourage cough and deep breathing. She may resume a regular diet as tolerated. She needs to apply an ice pack to the effected area. Do not get the dressing or bandage wet or dirty. Call [facility] orthopedics if the dressing gets water under it. Leave the dressing on. Staples should be removed and benzoin and Steri-Strips applied on 12/31/2012. She is to call [facility] orthopedics if any of the following occur: Bright red bleeding from the incision or wound, fever above 101.5 degrees, redness, swelling, or drainage at the incision. Call [facility] orthopedics if you experience any increased leg pain, numbness or weakness in your legs or bowel or bladder symptoms. She was referred to physical therapy. She needs to call [facility] for a followup appointment in about 6 weeks with [facility] orthopedics.   DISCHARGE MEDICATIONS: 1.  Vicodin 5/325 mg 1 orally 2 times a day as needed for pain.  2.  Docusate sodium 100 mg oral capsule 1 cap orally once a day as needed.  3.  Furosemide 20 mg oral tablet 1 tablet orally once a day.  4.  Magnesium 1 tablet orally once a day. 5.  Super B complex 1 tablet orally once a day.  6.  Vitamin D3 1000 international units oral capsule 1 cap orally once a day. 7.  Lanoxin 125 mcg oral tablet 1 tablet orally every other day. 8.  Diltiazem 24-hour extended release, 240 mg 24 hour oral capsule, extended release, 1 cap orally once a day. 9.  Klor-Con 20 milliequivalent tablet 1 tab once a day. 10.  Lisinopril 20 mg oral tablet 1 tablet orally once a day. 11.  Zyrtec 10 mg tablet 1 tablet orally once a day. 12.  Tylenol 325 mg oral tablet 1 tablet orally every 4 hours as needed for pain or temp greater than 100.4.  13.  Magnesium hydroxide 8% oral suspension 30 mL orally 2 times a day as needed for constipation.  14.  Warfarin 1 mg oral tablet 10 tabs orally once a day.  15.  Lovenox 30 mg subcutaneous every 24 hours until INR  is elevated to 2.  Please check INR daily until INR is 2.0.  16.  Letrozole 2.5 mg oral tablet 1 tablet orally once a day. 17.  Metoprolol succinate 25 mg oral tablet extended-release 1 tablet orally once a day.  18.  Bisacodyl 10 mg rectal suppository 1 suppository rectal once a day as needed for constipation.  19.  Polyethylene glycol 3350 oral powder for reconstitution 17 grams orally once a day.   OTHER INSTRUCTIONS:  Please check daily INR until INR is 2.0 then DC Lovenox.  ____________________________ Duanne Guess, PA-C tcg:sb D: 04-04-202014 10:17:01 ET    T: 04-04-202014 10:36:51 ET        JOB#: [id number] cc: Duanne Guess, PA-C, <Dictator> Duanne Guess [place] ELECTRONICALLY SIGNED 01/06/2013 10:45

## 2014-12-31 NOTE — Consult Note (Signed)
  DATE OF BIRTH:  May 19, 1917  DATE OF CONSULTATION:  12/14/2012  CONSULTING PHYSICIAN:  Tana Conch. Leslye Peer, MD  ADDENDUM The pharmacy tech did call the pharmacy. It is not metoprolol that the patient is taking, it is Letrozole. Since the patient was not on a beta blocker prior to going to surgery, will not start one at this point in time. This does not change anything with respect to the Operating Room, can go to the Operating Room tomorrow if INR is less than 1.6.   ____________________________ Tana Conch. Leslye Peer, MD rjw:mr D: 12/14/2012 17:06:36 ET T: 12/14/2012 20:01:45 ET JOB#: [id number]  cc: Tana Conch. Leslye Peer, MD, <Dictator> Marisue Brooklyn MD ELECTRONICALLY SIGNED 12/15/2012 15:20

## 2014-12-31 NOTE — Op Note (Signed)
PATIENT NAME:  Jill Ellison, Jill Ellison MR#:  [id number] DATE OF BIRTH:  1917/05/29  DATE OF PROCEDURE:  12/16/2012  PREOPERATIVE DIAGNOSIS: Left comminuted displaced proximal femur fracture basilar neck high intertrochanteric.   POSTOPERATIVE DIAGNOSIS: Left comminuted displaced proximal femur fracture basilar neck high intertrochanteric.   PROCEDURE: Left hip hemiarthroplasty.   ANESTHESIA: Spinal.   SURGEON: Laurene Footman, MD    DESCRIPTION OF PROCEDURE: The patient was brought to the operating room and after adequate spinal anesthesia was obtained, the patient was placed in the lateral decubitus position. The left hip was then prepped and draped in the usual sterile fashion. After patient identification and timeout procedures were completed, a posterior approach was made to the hip centered over the greater trochanter. The incision was carried down through the skin and subcutaneous tissue. The IT gluteal fascia was incised and a Charnley retractor placed. With internal rotation, it was noted that there was some comminution to the greater trochanter which made the case somewhat more difficult. The short external rotators were detached and the fracture site exposed with incision through the capsule proximally. Some difficulty that the head was removed from the acetabulum. When it was, it was measured and measured 45 to 46 mm and a 46 mm trial fit better in the acetabulum. The canal was then prepared first using a hand reamer followed by box osteotome and then broaching. Broaching was carried out to a size 10 press-fit 8 cement fit which appeared to give the appropriate fit and length. The cement restrictor was then inserted down the canal. The canal irrigated and dried, and the #8 ODC stem was inserted with excess cement removed. There was a piece of anterior calcar bone which the collar rested on which appeared to give appropriate length. After the cement had set, the +0 26 mm C-taper head and 46 mm  universal head component from the [employer]ryker set were attached and the hip was reduced after checking the acetabulum. Interposed cappsule was removed. Four Ethibond sutures were then placed around the greater trochanter through the hole in the implant laterally and then proximal and distal to help maintain reduction of the greater trochanter fracture. The deep fascia was then repaired using a heavy quill suture, 2-0 quill subcutaneously, and skin staples. Xeroform, 4x4s, ABD, and tape applied.   ESTIMATED BLOOD LOSS: 250.   COMPLICATIONS: None.   SPECIMENS: Femoral head.   CONDITION: To recovery room stable with abduction pillow in place.     ____________________________ Laurene Footman, MD mjm:es D: 12/17/2012 09:10:00 ET T: 12/17/2012 09:31:01 ET JOB#: [id number]  cc: Laurene Footman, MD, <Dictator> Laurene Footman MD ELECTRONICALLY SIGNED 03/08/2013 10:44

## 2014-12-31 NOTE — H&P (Signed)
PATIENT NAME:  Jill Ellison, Jill Ellison MR#:  [id number] DATE OF BIRTH:  July 17, 1917  DATE OF ADMISSION:  01/08/2013  PRIMARY CARE PHYSICIAN: Dr. Margarita Rana.   CHIEF COMPLAINT: Shortness of breath.   HISTORY OF PRESENT ILLNESS: The patient is a 81 year old, pleasant, white female with a past medical history of atrial fibrillation on chronic anticoagulation, hypertension, CHF diastatic. Presented to the Emergency Department with low oxygen saturations found at the nursing home. The patient had recent admission in April 2014 for left intratrochanteric fracture, and the patient underwent left hemiarthroplasty and was discharged to the nursing home. For the last 3 to 4 days, the patient started to require oxygen. Has been experiencing some shortness of breath with exertion. With physical therapy, the patient's ambulation improved. Walking with the help of a walker to the bathroom. By the time the patient returned from the bathroom, the patient is in severe shortness of breath. The patient was continued with her home Lasix and also torsemide. Denies having any chest pain. Denies having any cough or fever. This evening, the patient was more short of breath than usual; however, the patient somewhat denies. The patient's granddaughter, who is at bedside, stated that was having worsening of the symptoms with walking. Workup in the Emergency Department with a chest x-ray showed increased interstitial markings concerning about pulmonary edema. The patient also had elevated BNP of 4900.   PAST MEDICAL HISTORY:  1. Hypertension.  2. Hyperlipidemia.  3. Atrial fibrillation.  4. Congestive heart failure, diastolic.  5. Recent left hip fracture, status post hemiarthroplasty.  6. History of breast cancer, status post left mastectomy.  7. Skin cancer removed from left arm from multiple sites.   ALLERGIES: No known drug allergies.   HOME MEDICATIONS:  1. Zyrtec 10 mg once a day.  2. Warfarin 2 mg daily.  3. Vitamin  D3 1000 units once a day.  4. Torsemide 20 mg daily.  5. Vitamin B complex once a day.  6. Senna 2 tablets 2 times a day.  7. Potassium chloride 10 mEq daily.  8. MiraLAX daily.  9. Toprol-XL 25 mg once a day.  10. Magnesium hydroxide 30 mL as needed.  11. Lisinopril 20 mg once a day.  12. Letrozole 2.5 mg once a day.  13. Lanoxin 125 mcg 1 tablet every other day.  14. Lasix 20 mg once a day.  15. Docusate sodium 1 capsule once a day.  16. Diltiazem 240 mg once a day.  17. Bisacodyl suppository as needed.  18. Percocet to 1 tablet every 4 hours as needed.   SOCIAL HISTORY: No history of smoking, drinking alcohol or using illicit drugs.   FAMILY HISTORY: Positive for breast cancer.   REVIEW OF SYSTEMS:  CONSTITUTIONAL: Denies having any fatigue or weakness.  EYES: No blurred vision. No redness.  ENT: No sore throat or change in hearing.  RESPIRATORY: No cough. Has shortness of breath.  CARDIOVASCULAR: No chest pain, palpitations. No increased swelling in the lower extremities.  GASTROINTESTINAL: Has good appetite and is constipated.  GENITOURINARY: No dysuria or hematuria.  SKIN: No rash or lesions.  HEMATOLOGIC: No easy bruising or bleeding.  MUSCULOSKELETAL: Improved walking with the help of a walker.  NEUROLOGIC: No weakness or numbness in any part of the body.  ENDOCRINE: No increased polyuria or polydipsia.   PHYSICAL EXAMINATION:  GENERAL: This is a frail-looking age-appropriate female lying down in the bed, not in distress.  VITAL SIGNS: Temperature 98.5, pulse 79, blood pressure 140/70, respiratory  rate 22, oxygen saturation 90% on 2 liters of oxygen.  HEENT: Head normocephalic, atraumatic. There is no scleral icterus. Conjunctivae normal. Pupils equal and react to light. Mucous membranes mild dryness.  NECK: Supple. No lymphadenopathy. No JVD. No carotid bruit.  CHEST: Has no focal tenderness. Crackles are heard bibasilar; however, left lung has crackles heard up to  mid chest.  HEART: S1 and S2 irregular. No murmurs are heard.  ABDOMEN: Bowel sounds present. Soft, nontender, nondistended.  EXTREMITIES: No pedal edema. Pulses 2+.  SKIN: No rash or lesions.  LYMPHATIC: No cervical, inguinal or axillary lymphadenopathy.  MUSCULOSKELETAL: Good range of motion in all of the extremities.  NEUROLOGIC: The patient is alert, oriented to place, person and time. No apparent cranial nerve abnormalities. Motor 5/5 in upper and lower extremities.   LABS: Troponin negative. BNP 4360. Digoxin 0.39.   CHEST X-RAY, 1 VIEW PORTABLE: Increased interstitial consistent with congestive heart failure with mild interstitial edema and small pleural effusions.   LEFT HIP X-RAY: No acute bony abnormality.   CBC: Hemoglobin 8.8. CMP: Glucose 172. BUN 11, creatinine of 0.8, potassium of 3.4. The rest of the values are within normal limits.   INR 1.9.   EKG, 12-LEAD: Atrial fibrillation with no ST-T wave abnormalities.   ASSESSMENT AND PLAN: The patient is a 81 year old female with a recent hip fracture, currently undergoing rehabilitation. Has been experiencing some shortness of breath with exertion. No new requirement with oxygen.  1. Congestive heart failure, most likely diastolic: Will check the echocardiogram. Initial set of troponins are negative. Will also cycle cardiac enzymes x3, especially considering worsening of the congestive heart failure. Keep the patient on Lasix and oxygen. Continue with lisinopril and metoprolol.  2. Atrial fibrillation: Rate is well controlled. The patient is on chronic anticoagulation with Coumadin. INR is 1.9.  3. Recent hip fracture: She is able to ambulate with the help of a walker. Will continue with the physical therapy. The patient is on deep vein thrombosis prophylaxis with Coumadin.  4. Hypertension: Well controlled. Continue with the current medications.  5. Hypokalemia: Has potassium of 3.4. Will replace by mouth.  6. Anemia: Will  obtain iron profile, B12, folate, RBC. No current indication for transfusion. Will transfuse for hemoglobin less than 7.  7. The patient is already on therapeutic dose of Coumadin.   TIME SPENT: 55 minutes.   ____________________________ Monica Becton, MD pv:gb D: 01/08/2013 01:45:00 ET T: 01/08/2013 03:23:55 ET JOB#: [id number]  cc: Monica Becton, MD, <Dictator> Jerrell Belfast, MD Monica Becton MD ELECTRONICALLY SIGNED 01/12/2013 7:04

## 2014-12-31 NOTE — Discharge Summary (Signed)
PATIENT NAME:  Jill Ellison, Jill Ellison MR#:  [id number] DATE OF BIRTH:  [birth year]  DATE OF ADMISSION:  01/08/2013 DATE OF DISCHARGE:  01/12/2013  PRIMARY CARE PHYSICIAN: Margarita Rana, MD  DISCHARGE DIAGNOSES: 1.  Acute on chronic diastolic congestive heart failure with ejection fraction of 60% to 65%.  2.  Urinary tract infection.  3.  Hypertension.  4.  Atrial fibrillation, rate controlled. 5.  Recent hip fracture.   DISCHARGE CONDITION: Stable.   CODE STATUS: FULL CODE.   HOME MEDICATIONS: Please refer to the [facility] physician discharge instruction medication reconciliation list.   HOME OXYGEN: 1 to 2 liters by nasal cannula p.r.n.   DIET: Low sodium diet.   ACTIVITY: As tolerated.   FOLLOW-UP CARE: With PCP within 1 to 2 weeks.   REASON FOR ADMISSION: Shortness of breath.   HOSPITAL COURSE: The patient is a 81 year old Caucasian female with a history of A-fib, hypertension and CHF who presented to the ED with shortness of breath. The patient came from a nursing home. She developed shortness of breath for 3 to 4 days requiring oxygen.  In the ED, the patient's chest x-ray showed increased interstitial markings concerning for pulmonary edema. BNP was 4900. She was admitted for acute on chronic diastolic CHF.  For detailed history and physical examination, please refer to the admission note dictated by Dr. Lunette Stands.   1.  After admission, the has been treated with IV Lasix 40 mg daily.  The patient's symptoms are much improved.  The patient has no complaints of shortness of breath or cough, but her O2 saturation decreased to 85 without oxygen, so the patient was put back on oxygen, 1 liter  by nasal cannula. The patient has no symptoms.  2.  UTI.  The patient has been treated with Rocephin for 5 days.  Urine culture showed E. coli sensitive to Rocephin.  3.  Hypokalemia. The patient's potassium has been low, about 3.4 to 3.3.  Has treated with potassium supplement.  4 .  The patient's  A-fib has been controlled with Lopressor and Cardizem. The patient is on Coumadin 2 mg p.o. daily, but INR is 1.5. The patient has had frequent falls recently and had a hip fracture. I will defer to the patient's primary care physician to decide whether continuing Coumadin due to high risk of fall and bleeding, but the patient's INR is subcutaneous. We will continue Coumadin and have the patient follow up with PCP.   The patient's vital signs are stable. She is clinically stable.  She will be discharged back to nursing home today.  I discussed the patient's discharge plan with the patient, the patient's family member and the case manager.   TIME SPENT: About 36 minutes.  ____________________________ Demetrios Loll, MD qc:sb D: 01/12/2013 11:53:09 ET T: 01/12/2013 12:09:17 ET JOB#: [id number]  cc: Demetrios Loll, MD, <Dictator> Demetrios Loll MD ELECTRONICALLY SIGNED 01/12/2013 15:51

## 2014-12-31 NOTE — Consult Note (Signed)
DATE OF BIRTH:  06/23/1917  DATE OF CONSULTATION:  12/14/2012  CONSULTING PHYSICIAN:  Tana Conch. Leslye Peer, MD  PRIMARY CARE PHYSICIAN:  Dr. Venia Minks  CONSULT REQUESTED BY:  Dr. Mardi Mainland   REASON FOR CONSULTATION:  Preop management for hip fracture.   HISTORY OF PRESENT ILLNESS:  This is a 81 year old female who states that she got out of her chair, and she went to walk and either tripped over the rug or the side of the chair, and had a fall. No loss of consciousness. She does have left hip pain. This accident happened at 1:00 p.m. today. Family did bring her in. Of note, she did have a fall yesterday. She was up on a ladder and she had a fall, and she has a scrape on her left shin and skin tear, which family put a dressing on. Patient has no complaints of chest pain or shortness of breath.   PAST MEDICAL HISTORY: Atrial fibrillation, mild dementia, hypertension, history of congestive heart failure, history of breast cancer.   PAST SURGICAL HISTORY:  Left mastectomy, skin cancer removals at multiple sites.   ALLERGIES:  No known drug allergies.   MEDICATIONS:  As per the family, include Coumadin 2 mg tablets, she takes a pattern of 3 tablets/2 tablets/2 tablets, and repeating. Digoxin 125 mcg daily, diltiazem extended release 240 mg daily, Lasix 20 mg daily, hydrocodone/Tylenol 5/325 as needed for pain, kdur 20 mEq daily, femara 2.5 mg daily, lisinopril 20 mg daily.   SOCIAL HISTORY:  No smoking. No alcohol. No drug use. Lives with her granddaughter. She was a weaver in the past.   FAMILY HISTORY:  Mother died of cancer of the female organs. Father died at an early age, unknown cause, in his 74s.   REVIEW OF SYSTEMS:   CONSTITUTIONAL:  No fever, chills or sweats. No weight loss. No weight gain. No weakness or fatigue.  EYES:  Does wear glasses.  EARS, NOSE, MOUTH AND THROAT: No hearing loss. No sore throat. No difficulty swallowing.  CARDIOVASCULAR:  No chest pain. No palpitations.   RESPIRATORY:  Positive for shortness of breath with walking a lot. No cough. No sputum. No hemoptysis.  GASTROINTESTINAL: No nausea. No vomiting. No abdominal pain. No diarrhea. No constipation. No bright red blood per rectum. No melena.  GENITOURINARY:  No burning on urination or hematuria.  MUSCULOSKELETAL:  Positive for left hip pain. Occasional hand pain.  INTEGUMENT:  No rashes or eruptions, but does have a skin tear on the left lower extremity.  NEUROLOGICAL:  No fainting or blackouts.  PSYCHIATRIC:  No anxiety or depression.  ENDOCRINE:  No thyroid problems.  HEMATOLOGIC/LYMPHATIC: No anemia. No easy bruising or bleeding.   PHYSICAL EXAMINATION: VITAL SIGNS:  Temperature 98, pulse 87, respirations 18, blood pressure 191/75, pulse ox 96% on room air.  GENERAL:  No respiratory distress.  EYES: Conjunctivae and lids normal. Pupils equal, round and reactive to light. Extraocular muscles intact. No nystagmus.  EARS, NOSE, MOUTH AND THROAT:  Tympanic membranes:  No erythema. Nasal mucosa: No erythema. Throat:  No erythema, no exudate seen. Lips and gums:  No lesions.  NECK: No JVD. No bruits. No lymphadenopathy. No thyromegaly. No thyroid nodules palpated.  RESPIRATORY:  Lungs clear to auscultation. No use of accessory muscles to breathe. No rhonchi, rales or wheeze heard.  CARDIOVASCULAR SYSTEM:  S1, S2 normal. Irregularly irregular. No gallops or rubs heard. A 2/6 systolic ejection murmur. Carotid upstroke 2+ bilaterally. No bruits. Dorsalis pedis pulses 2+ bilaterally. No  edema of the lower extremities. ABDOMEN:  Soft, nontender. No organomegaly/splenomegaly. Normoactive bowel sounds. No masses felt.  LYMPHATIC:  No lymph nodes in the neck.  MUSCULOSKELETAL: No clubbing, edema or cyanosis.  SKIN:  No rashes or ulcers seen.  NEUROLOGIC:  Cranial nerves II through XII grossly intact. Deep tendon reflexes 2+ bilateral lower extremities.  PSYCHIATRIC:  The patient is oriented to person and  place. The patient does ask the same question over and over again.   LABORATORY AND RADIOLOGICAL DATA:  EKG shows atrial fibrillation, 73 beats per minute, Q waves septally. Urinalysis:  30 mg/dL protein. White blood cell count 5.7, H and H 11.9 and 35.8, platelet count of 240. Glucose 129, BUN 26, creatinine 0.97, sodium 141, potassium 5.2, chloride 105, CO2 of 33, calcium 8.3. Liver function tests:  AST slightly elevated at 42. INR 2.0. Left hip complete showed angulated left femoral neck fracture. Pelvis x-ray: Angulated left femoral neck fracture. Chest x-ray shows cardiomegaly, no congestive heart failure.   ASSESSMENT AND PLAN: 1.  Preoperative consultation for left hip fracture. At this point, we will need to hold Coumadin. I will give one dose of oral vitamin K 5 mg. If INR is less than 1.6 in the morning, can proceed with surgery. I see no other contraindications to surgery at this time. Patient is already on Toprol.   2.  Malignant hypertension. Most likely this is secondary to pain. Pain control and continue usual medications should help out with the blood pressure.   3.  Atrial fibrillation. The patient is on 3 medications for rate control:  Digoxin, diltiazem and metoprolol. Will check the digoxin level and continue medications. Coumadin will be on hold for surgery. Higher risk of stroke explained to family while off this medication.   4.  History of congestive heart failure. No signs of congestive heart failure at this time.   5.  Mild dementia. Need to consider benefits and risks of Coumadin in this patient.   6.  Hyperkalemia. Will continue to monitor and recheck in the morning.   Time spent on consultation:  55 minutes.   PATIENT IS A FULL CODE.    ____________________________ Tana Conch. Leslye Peer, MD rjw:mr D: 12/14/2012 17:03:03 ET T: 12/14/2012 19:45:41 ET JOB#: [id number]  cc: Tana Conch. Leslye Peer, MD, <Dictator> Marisue Brooklyn MD ELECTRONICALLY SIGNED 12/15/2012 15:20

## 2014-12-31 NOTE — Consult Note (Signed)
Brief Consult Note: Diagnosis: Left prox femur fx, intertroch/fem neck fx.   Patient was seen by consultant.   Consult note dictated.   Recommend to proceed with surgery or procedure.   Orders entered.   Discussed with Attending MD.   Comments: traction view done, mixed fem neck/intertroch fx pattern inr too high for surgery surgical management per dr Rudene Christians (cephomed nail vs hemiarthroplasty) ted/scd traction int med consult done full note to follow.  Electronic Signatures: Maebelle Munroe (MD)  (Signed 06-Apr-14 19:21)  Authored: Brief Consult Note   Last Updated: 06-Apr-14 19:21 by Maebelle Munroe (MD)

## 2015-01-01 NOTE — H&P (Signed)
PATIENT NAME:  Jill Ellison, Jill Ellison MR#:  [id number] DATE OF BIRTH:  1917/09/08  DATE OF ADMISSION:  09/09/2014  PRIMARY CARE PHYSICIAN:  Jerrell Belfast, MD  CHIEF COMPLAINT: Brought in by family after an episode.   HISTORY OF PRESENT ILLNESS: This is a 81 year old female who got up to go to the bathroom today, her legs got weak, family sat her in a wheelchair, she started shaking and was unresponsive for a few minutes, then she started spitting up a little bit. When she first came through, she was a little confused. Family does not think that she lost urine or bowel function at the time. No tongue-biting. In the ER, the patient feels fine and hospitalist services were contacted for this questionable syncope evaluation.   PAST MEDICAL HISTORY: Dementia, breast cancer, status post mastectomy, history of congestive heart failure, hypertension, skin cancer right leg, questionable vertigo history, atrial fibrillation, weakness. Walks with a walker.   PAST SURGICAL HISTORY: Mastectomy, skin cancer removal from the leg, hip fracture surgery.   ALLERGIES: No known drug allergies.   MEDICATIONS: Include acetaminophen with hydrocodone 1 tablet every 6 hours as needed for pain, Allegra 180 mg daily, calcium with vitamin D 1 tablet twice a day, vitamin E 1000 international units daily, cyclobenzaprine 5 mg twice a day as needed for pain, diltiazem ER 240 mg daily, furosemide 20 mg daily, Lanoxin 125 mcg every other day, letrozole 2.5 mg daily, lisinopril 20 mg daily, magnesium 500 mg daily, multivitamin 1 tablet daily, potassium chloride 20 mEq daily, ProAir HFA 90 mcg per inhalations 2 puffs 4 times a day as needed for shortness of breath. Coumadin 2.5 mg, on Sunday, Monday, Tuesday, Thursday and Saturday, 5 mg on Wednesday and Friday, Xopenex inhaler 2 puffs every 4 hours as needed for shortness of breath, wheezing.   SOCIAL HISTORY: No smoking. No alcohol. No drug use. Lives with family.   FAMILY HISTORY:  Father died of liver cancer. Mother died of breast cancer.   REVIEW OF SYSTEMS: CONSTITUTIONAL: Positive for chills. No fever or sweats. No weight loss. No weight gain. Some weakness of the legs.  EYES: She does wear glasses.  EARS, NOSE, MOUTH AND THROAT: Positive for runny nose. No sore throat. No difficulty swallowing.  CARDIOVASCULAR: No chest pain. No palpitations.  RESPIRATORY: No shortness of breath. Positive for cough. No sputum. No hemoptysis.  GASTROINTESTINAL: No nausea. No vomiting. No abdominal pain. No diarrhea. No constipation. No bright red blood per rectum. No melena.  GENITOURINARY: No burning on urination or hematuria.  MUSCULOSKELETAL: Positive for joint pain.  INTEGUMENT: No rashes or eruptions.  NEUROLOGIC: No fainting or blackout history in the past, first episode today.  PSYCHIATRIC: No anxiety or depression.  ENDOCRINE: No thyroid problems.  HEMATOLOGIC AND LYMPHATIC: No anemia. No easy bruising or bleeding.   PHYSICAL EXAMINATION:  VITAL SIGNS: Temperature 98.4, pulse 79, respirations 18, blood pressure 137/99, pulse oximetry 94% on room air.  GENERAL: No respiratory distress.  EYES: Conjunctivae and lids normal. Pupils equal, round, and reactive to light. Extraocular muscles intact. No nystagmus.  EARS, NOSE, MOUTH AND THROAT: Tympanic membranes: No erythema. Nasal mucosa: No erythema. Throat: No erythema. No exudate seen. Lips and gums: No lesions.  NECK: No JVD. No bruits. No lymphadenopathy. No thyromegaly. No thyroid nodules palpated.  RESPIRATORY: Lungs decreased breath sounds bilaterally. Positive rhonchi, bilateral bases.  CARDIOVASCULAR SYSTEM: S1, S2 normal, irregularly irregular.  ABDOMEN: Soft, nontender. No organomegaly or splenomegaly. Normoactive bowel sounds. No masses felt.  LYMPHATIC: No lymph nodes in the neck.  MUSCULOSKELETAL: No clubbing, edema, or cyanosis.  SKIN: No ulcers or lesions.  NEUROLOGIC: Cranial nerves 2 through 12 grossly  intact. As per family right facial drrop chronic.  Deep tendon reflexes 2+ bilateral lower extremities. Power 5 out of 5, upper and lower extremities.  PSYCHIATRIC: The patient is oriented to person and place.   LABORATORY AND RADIOLOGICAL DATA: Chest x-ray negative. CT scan of the head: Atrophy, patchy subtentorial small vessel disease, prior infarct, posterior medial left occipital lobe, no acute infarct apparent. Urinalysis negative. White blood cell count 6200, hemoglobin and hematocrit 10.6 and 32.5, platelet count of 267,000, glucose 88, BUN 29, creatinine 1.71, sodium 141, potassium 4.2, chloride 105, CO2 31, calcium 8.4. Liver function tests normal range. Troponin negative. INR 2.2, digoxin added on by me 0.7. EKG, atrial fibrillation, septal infarct.   ASSESSMENT AND PLAN:  1. Syncope, possible seizure. Unclear what happened but the patient is feeling well now. I gave the patient the option of going home versus inpatient observation and evaluation. Family was concerned and wanted to be observed and have some testing done here. I ordered an MRI of the brain, carotid ultrasound, echocardiogram, I spoke with Dr. Irish Elders,  neurology. Because of the holiday, we probably will not have EEG available. He recommended probably not giving any seizure medications at this point since this is the first episode of seizure.  2. Dehydration. We will give IV fluid hydration, 1 bolus and then hold the Lasix at this point.  3. Bronchitis. We will give Z-Pak, nebulizer treatment.  4. Dementia.  5. History of breast cancer.  6. Atrial fibrillation. The patient is therapeutic on Coumadin with an INR of 2.2, rate control with digoxin with normal level and Cardizem.    TIME SPENT ON ADMISSION: 55 minutes.   The patient is a full code.    ____________________________ Tana Conch. Leslye Peer, MD rjw:kl D: 09/09/2014 15:32:49 ET T: 09/09/2014 16:26:37 ET JOB#: [id number]  cc: Tana Conch. Leslye Peer, MD, <Dictator> Jerrell Belfast, MD Marisue Brooklyn MD ELECTRONICALLY SIGNED 09/09/2014 19:43

## 2015-01-05 NOTE — Discharge Summary (Signed)
PATIENT NAME:  Jill, Ellison MR#:  [id number] DATE OF BIRTH:  12/27/1916  DATE OF ADMISSION:  09/09/2014 DATE OF DISCHARGE:    PRIMARY CARE PHYSICIAN:  Dr. Venia Minks.    DISCHARGE DIAGNOSES:   1.  Syncope.  2.  Questionable seizure.  3.  Acute renal failure.  4.  Bronchitis.  5.  Atrial fibrillation.   6.  Dementia.   CONDITION: Stable.   CODE STATUS: Full code.   HOME MEDICATIONS: Please refer to medication reconciliation list. The patient's Lasix and potassium and Flexeril discontinued due to dehydration and altered mental status.   DIET: Low-sodium diet.   ACTIVITY: As tolerated.   FOLLOWUP CARE: Follow up PCP within 1-2 weeks.   REASON FOR ADMISSION:  Syncope episode.    HOSPITAL COURSE: The patient is a 80 year old female with a history of dementia, breast cancer, congestive heart failure, hypertension, who was sent to ED from home due to syncope episode and body shaking. According to the patient and family member the patient started shaking and was unresponsive for a few minutes and then started spitting up a little bit. For a detailed history and physical examination please refer to the admission note dictated by Dr. Leslye Peer.   LABORATORY DATA:  On admission date the patient's urinalysis is negative. WBC 6.2, hemoglobin 10.6, BUN 29, creatinine 1.71. Electrolytes are normal. INR 2.2.  CAT scan of head did not show any acute abnormality. Chest x-ray is negative.   1.  For syncope episode with possible seizure after admission the patient is alert, awake, no complaints, no shaking or tremor. The patient got an MRI of brain which is negative. Carotid duplex showed less than 50% stenosis bilaterally. Echocardiogram is pending. Dr. Irish Elders suggests that the patient has questionable seizure, does not need any treatment. The patient can be discharged home today. I discussed with the patient and the patient's grandson, they do not want any home health or physical therapy.  2.  For  acute renal failure with dehydration the patient was treated with IV fluid support. Creatinine decreased to 1.59.  Encourage patient's oral fluid intake.  3.  Bronchitis. The patient was suspected to have bronchitis was given Z-Pak, but the patient has no complaints. I will discontinue Z-Pak.  4.  Atrial fibrillation is controlled with Cardizem and Coumadin. Coumadin level is therapeutic at 2.2.   The patient has no complaints. Vital signs are stable. Physical examination is unremarkable.  The patient is clinically stable, will be discharged to home today. I discussed the patient's discharge plan with the patient, the patient's grandson, nurse, case manager, and Dr. Irish Elders.   TIME SPENT: About 43 minutes.    ____________________________ Demetrios Loll, MD qc:bu D: 09/10/2014 13:33:00 ET T: 09/10/2014 13:56:34 ET JOB#: [id number]  cc: Demetrios Loll, MD, <Dictator> Demetrios Loll MD ELECTRONICALLY SIGNED 09/10/2014 14:39

## 2015-01-09 NOTE — Discharge Summary (Signed)
PATIENT NAME:  Jill Ellison, SYFERT MR#:  [id number] DATE OF BIRTH:  1916-09-12  DATE OF ADMISSION:  09/16/2014 DATE OF DISCHARGE:  09/18/2014  For a detailed note, please take a look at the history and physical done on admission.   DIAGNOSES AT DISCHARGE: Urinary tract infection secondary to extended-spectrum beta-lactamase, weakness likely related to urinary tract infection, history of chronic atrial fibrillation,  hypertension, history of breast cancer, chronic obstructive pulmonary disease.   DIET: The patient is being discharged on a low-sodium, low-fat diet.   ACTIVITY: As tolerated.   FOLLOWUP: With Dr. Margarita Rana in the next 1-2 days.    DISCHARGE MEDICATIONS: Digoxin 125 mcg daily; Cardizem CD 240 mg daily; lisinopril 20 mg daily; letrozole 2.5 mg daily; calcium vitamin D 1 tablet b.i.d.; cholecalciferol 1000 international units daily; Allegra 180 mg daily; multivitamin daily; Tylenol with hydrocodone 1 tablet q. 6 hours  as needed; Xopenex nebulizers every 4 hours as needed; warfarin 5 mg on Wednesdays and Fridays, 2.5 mg 1 every other day; albuterol inhaler 2 puffs 4 times daily as needed; magnesium gluconate 5 mg daily; Bactrim single strength 1 tab b.i.d. x 10 days; doxycycline 100 mg b.i.d.   PERTINENT STUDIES DONE DURING THE HOSPITAL COURSE: A chest x-ray done on admission showing mild diffuse interstitial pulmonary edema, focal asymmetric airspace edema versus pneumonia in the central upper lobe, moderate right-sided pleural effusion, right lower lobe bronchiectasis. Urine cultures to be positive for ESBL.   HOSPITAL COURSE: This is a 81 year old female who presented to the hospital with weakness, shortness of breath.  1.  Pneumonia: This was likely community-acquired pneumonia. The patient had failed outpatient therapy with oral Zithromax, therefore, was admitted to hospital, started on IV ceftriaxone and Zithromax. Her blood cultures have remained negative so far. She clinically  doing much better. She was ambulated and did not desaturate, did not qualify for home oxygen presently. I will discharge on oral doxycycline. 2.  Urinary tract infection: This was secondary to ESBL. She clinically has improved despite not getting adequate antibiotic therapy. At this point, I did discuss with the family member about placing a PICC line and putting her on ertapenem but that seems a bit aggressive, as she is clinically improved. She has been afebrile with a normal white cell count. It sensitive to oral Bactrim. I am going to discharge oral Bactrim, but knowing that it would probably related to her creatinine and affect and her INR, which was discussed with the family and the patient is to have her renal function and INR checked sometime mid next week. She will be discharged on Bactrim single strength b.i.d. x 10 days.  3.  Weakness: This was likely secondary to a combination of her pneumonia and UTI. She was seen by physical therapy. They recommended short-term rehabilitation, although the patient's family did not want to send her rehabilitation. We also recommended home health services but she has 24-hour care with the family and they do not want that at this time.  4.  History of CHF: The patient was diuresed with some Lasix has improved. She will continue her ACE and Cardizem for now.  5.  History of chronic atrial fibrillation: The patient remained rate controlled. She will continue her Cardizem and digoxin. She is on Coumadin. Her INR is therapeutic.  6.  History of breast cancer. The patient was maintained on her letrozole and she will resume that.   CODE STATUS: The patient is a full code.   TIME SPENT ON  DISCHARGE: 45 minutes.    ____________________________ Belia Heman. Verdell Carmine, MD vjs:bm D: 09/18/2014 15:20:48 ET T: 09/19/2014 00:45:58 ET JOB#: [id number]  cc: Belia Heman. Verdell Carmine, MD, <Dictator> Jerrell Belfast, MD Henreitta Leber MD ELECTRONICALLY SIGNED 10/05/2014 10:41

## 2015-01-09 NOTE — H&P (Signed)
PATIENT NAME:  Jill Ellison, Jill Ellison MR#:  [id number] DATE OF BIRTH:  [birth year]  DATE OF ADMISSION:  09/16/2014  PRIMARY CARE PHYSICIAN: Jerrell Belfast, MD  CHIEF COMPLAINT: Weakness and shortness of breath.   HISTORY OF PRESENT ILLNESS: This is a 81 year old female who presented the hospital due to weakness and shortness of breath that began earlier this morning. The patient was recently discharged from the hospital about a week ago for a workup of syncope, which was negative and also discharged on Zithromax for possible treatment of acute bronchitis. She now returns with worsening shortness of breath and weakness. Her chest x-ray findings are suggestive of possible pneumonia versus congestive heart failure. She was also noted to have a urinary tract infection. Hospitalist services were contacted for further treatment and evaluation. The patient denies any dysuria, hematuria, or any frequency. She denies any fevers, chills, any nausea, vomiting, abdominal pain. She does admit to some mild shortness of breath on exertion, positive cough, which is productive with clear sputum, but no other associated symptoms.   REVIEW OF SYSTEMS:  CONSTITUTIONAL: No documented fever. Positive fatigue and weakness. No weight gain, no weight loss.  EYES: No blurred or double vision.  ENT: No tinnitus. No postnasal drip. No redness of the oropharynx.  RESPIRATORY: Positive cough. No wheeze. No hemoptysis. Positive dyspnea.  CARDIOVASCULAR: No chest pain, no orthopnea, no palpitation, no syncope.  GASTROINTESTINAL: No nausea, vomiting, diarrhea, no abdominal pain. No melena or hematochezia.  GENITOURINARY: No dysuria or hematuria.  ENDOCRINE: No polyuria or nocturia. No heat or cold intolerance. HEMATOLOGY: No anemia, no bruising, no bleeding.  INTEGUMENTARY: No rashes. No lesions.  MUSCULOSKELETAL: No arthritis, no swelling, no gout.  NEUROLOGIC: No numbness, tingling, or ataxia. No seizure activity.  PSYCHIATRIC:  No anxiety. No insomnia. No ADD.   PAST MEDICAL HISTORY: Consistent with hypertension, history of chronic atrial fibrillation, history of congestive heart failure, breast cancer, skin cancer, vertigo.   ALLERGIES: No known drug allergies.   SOCIAL HISTORY: No smoking. No alcohol abuse. No illicit drug abuse. Lives at home with her grandson.   FAMILY HISTORY: Mother and father are both deceased. Both died from cancer.   CURRENT MEDICATIONS: As follows: Tylenol with hydrocodone 1 tablet q.6 h. as needed, Allegra 180 mg daily, azithromycin, a Z-Pak. Calcium and vitamin D 1 tab b.i.d., cholecalciferol 1000 international units daily, diltiazem CD 240 mg daily, digoxin 125 mcg daily, letrozole 2.5 mg daily, lisinopril 20 mg daily, magnesium gluconate 5 mg daily, multivitamin daily, albuterol inhaler 2 puffs 4 times daily as needed, warfarin 2.5 mg Monday, Tuesday, Thursday, Saturday, and Sunday and warfarin 5 mg Wednesdays and Fridays, Xopenex nebulizers every 4 hours as needed.   PHYSICAL EXAMINATION: Presently is as follows:  VITAL SIGNS: Noted to be temperature 98.1, pulse 68, respirations 20, blood pressure 158/77, sats 98% on 2 L nasal cannula.  GENERAL: She is a pleasant-appearing female, but in no apparent distress. HEENT: She is atraumatic, normocephalic. Extraocular muscles are intact. Pupils equal and reactive to light. Sclerae are anicteric. No conjunctival injection. No pharyngeal erythema.  NECK: Supple. There is no jugular venous distention. No bruits, lymphadenopathy, or thyromegaly.  HEART: Regular. No murmurs, no rubs, no clicks.  LUNGS: She has prolonged inspiratory and expiratory phase. Bibasilar crackles. End expiratory wheezing. Negative use of accessory muscles. No dullness to percussion.  ABDOMEN: Soft, flat, nontender, nondistended. Has good bowel sounds. No hepatosplenomegaly appreciated.  EXTREMITIES: No evidence of any cyanosis, clubbing, or peripheral edema. Has +2  pedal and  radial pulses bilaterally.  NEUROLOGIC: The patient is alert, awake, and oriented x3 with no focal, motor, sensory deficits appreciated bilaterally. SKIN: Moist and warm with no rashes appreciated.  LYMPHATIC: There is cervical or axillary lymphadenopathy.   LABORATORY DATA: Showed a serum glucose of 104, BNP of 12,000. BUN 19, creatinine 1.5, sodium 139, potassium 4.0, chloride 104, bicarbonate 30, troponin less than 0.02. White cell count 9.5, hemoglobin 10, hematocrit 30.7, platelet count of 272,000. INR is 2.7. Urinalysis shows 3+ leukocyte esterase with 725 white cells. Chest x-ray showing mild diffuse interstitial edema, focal asymmetric airspace edema versus pneumonia in the central right upper lobe.   ASSESSMENT AND PLAN: This 81 year old female with history of chronic atrial fibrillation, hypertension, history of congestive heart failure history of breast cancer, skin cancer, vertigo who presents to the hospital due to shortness of breath, weakness, and noted to have a pneumonia and urinary tract infection.  1.  Pneumonia. Most likely this is community-acquired pneumonia, as the patient has failed outpatient therapy with oral Zithromax. I will start the patient on IV ceftriaxone and Zithromax. Follow sputum cultures and follow her clinically.  2.  Urinary tract infection, treated with IV ceftriaxone. Follow urine cultures.  3.  Weakness. This is probably multifactorial related to the pneumonia and urinary tract infection and also deconditioning. We will treat the underlying infection. Also get a physical therapy consult to assess her mobility. The patient may need possible short-term rehab.  4.  History of congestive heart failure. The patient clinically is in mild congestive heart failure. This is acute on chronic diastolic dysfunction. I will diurese her with IV Lasix, follow ins and outs and daily weights. Continue her ACE. Continue Cardizem.  5.  History of chronic atrial fibrillation. The  patient is rate controlled. I will continue her Cardizem and digoxin. I will continue her Coumadin. INR is therapeutic.  6.  History of breast cancer: Continue with letrozole.   CODE STATUS: The patient is a full code.   TIME SPENT: 50 minutes.   ____________________________ Belia Heman. Verdell Carmine, MD vjs:sw D: 09/16/2014 10:22:29 ET T: 09/16/2014 10:33:00 ET JOB#: [id number]  cc: Belia Heman. Verdell Carmine, MD, <Dictator> Henreitta Leber MD ELECTRONICALLY SIGNED 10/05/2014 11:49

## 2015-01-14 ENCOUNTER — Ambulatory Visit: Payer: Medicaid Other | Admitting: Cardiovascular Disease

## 2015-01-17 ENCOUNTER — Emergency Department: Payer: Medicare Other

## 2015-01-17 ENCOUNTER — Inpatient Hospital Stay
Admission: EM | Admit: 2015-01-17 | Discharge: 2015-01-28 | DRG: 871 | Disposition: A | Discharge status: Hospice | Payer: Medicare Other | Attending: Internal Medicine | Admitting: Internal Medicine

## 2015-01-17 DIAGNOSIS — Z8249 Family history of ischemic heart disease and other diseases of the circulatory system: Secondary | ICD-10-CM

## 2015-01-17 DIAGNOSIS — I129 Hypertensive chronic kidney disease with stage 1 through stage 4 chronic kidney disease, or unspecified chronic kidney disease: Secondary | ICD-10-CM | POA: Diagnosis not present

## 2015-01-17 DIAGNOSIS — N184 Chronic kidney disease, stage 4 (severe): Secondary | ICD-10-CM | POA: Diagnosis not present

## 2015-01-17 DIAGNOSIS — I5032 Chronic diastolic (congestive) heart failure: Secondary | ICD-10-CM | POA: Diagnosis present

## 2015-01-17 DIAGNOSIS — Z7401 Bed confinement status: Secondary | ICD-10-CM | POA: Diagnosis not present

## 2015-01-17 DIAGNOSIS — D6489 Other specified anemias: Secondary | ICD-10-CM | POA: Diagnosis not present

## 2015-01-17 DIAGNOSIS — F0390 Unspecified dementia without behavioral disturbance: Secondary | ICD-10-CM | POA: Diagnosis present

## 2015-01-17 DIAGNOSIS — J189 Pneumonia, unspecified organism: Secondary | ICD-10-CM | POA: Diagnosis present

## 2015-01-17 DIAGNOSIS — I482 Chronic atrial fibrillation: Secondary | ICD-10-CM | POA: Diagnosis not present

## 2015-01-17 DIAGNOSIS — E8770 Fluid overload, unspecified: Secondary | ICD-10-CM | POA: Diagnosis present

## 2015-01-17 DIAGNOSIS — E876 Hypokalemia: Secondary | ICD-10-CM | POA: Diagnosis not present

## 2015-01-17 DIAGNOSIS — Z853 Personal history of malignant neoplasm of breast: Secondary | ICD-10-CM

## 2015-01-17 DIAGNOSIS — Z79811 Long term (current) use of aromatase inhibitors: Secondary | ICD-10-CM

## 2015-01-17 DIAGNOSIS — R0902 Hypoxemia: Secondary | ICD-10-CM

## 2015-01-17 DIAGNOSIS — A403 Sepsis due to Streptococcus pneumoniae: Secondary | ICD-10-CM | POA: Diagnosis not present

## 2015-01-17 DIAGNOSIS — Z515 Encounter for palliative care: Secondary | ICD-10-CM

## 2015-01-17 DIAGNOSIS — R112 Nausea with vomiting, unspecified: Secondary | ICD-10-CM | POA: Diagnosis not present

## 2015-01-17 DIAGNOSIS — I4891 Unspecified atrial fibrillation: Secondary | ICD-10-CM | POA: Diagnosis not present

## 2015-01-17 DIAGNOSIS — Z7901 Long term (current) use of anticoagulants: Secondary | ICD-10-CM

## 2015-01-17 DIAGNOSIS — Z9981 Dependence on supplemental oxygen: Secondary | ICD-10-CM | POA: Diagnosis not present

## 2015-01-17 DIAGNOSIS — N183 Chronic kidney disease, stage 3 (moderate): Secondary | ICD-10-CM | POA: Diagnosis not present

## 2015-01-17 DIAGNOSIS — Z66 Do not resuscitate: Secondary | ICD-10-CM | POA: Diagnosis not present

## 2015-01-17 DIAGNOSIS — N179 Acute kidney failure, unspecified: Secondary | ICD-10-CM | POA: Diagnosis not present

## 2015-01-17 DIAGNOSIS — J45909 Unspecified asthma, uncomplicated: Secondary | ICD-10-CM | POA: Diagnosis not present

## 2015-01-17 DIAGNOSIS — E46 Unspecified protein-calorie malnutrition: Secondary | ICD-10-CM | POA: Diagnosis not present

## 2015-01-17 DIAGNOSIS — B953 Streptococcus pneumoniae as the cause of diseases classified elsewhere: Secondary | ICD-10-CM | POA: Diagnosis not present

## 2015-01-17 DIAGNOSIS — K219 Gastro-esophageal reflux disease without esophagitis: Secondary | ICD-10-CM | POA: Diagnosis present

## 2015-01-17 DIAGNOSIS — Z79899 Other long term (current) drug therapy: Secondary | ICD-10-CM

## 2015-01-17 DIAGNOSIS — J154 Pneumonia due to other streptococci: Secondary | ICD-10-CM | POA: Diagnosis present

## 2015-01-17 DIAGNOSIS — R0602 Shortness of breath: Secondary | ICD-10-CM | POA: Diagnosis not present

## 2015-01-17 DIAGNOSIS — I517 Cardiomegaly: Secondary | ICD-10-CM | POA: Diagnosis not present

## 2015-01-17 DIAGNOSIS — J9601 Acute respiratory failure with hypoxia: Secondary | ICD-10-CM | POA: Diagnosis not present

## 2015-01-17 DIAGNOSIS — A419 Sepsis, unspecified organism: Secondary | ICD-10-CM

## 2015-01-17 DIAGNOSIS — Z809 Family history of malignant neoplasm, unspecified: Secondary | ICD-10-CM

## 2015-01-17 DIAGNOSIS — R000 Tachycardia, unspecified: Secondary | ICD-10-CM | POA: Diagnosis not present

## 2015-01-17 DIAGNOSIS — M1990 Unspecified osteoarthritis, unspecified site: Secondary | ICD-10-CM | POA: Diagnosis not present

## 2015-01-17 DIAGNOSIS — I10 Essential (primary) hypertension: Secondary | ICD-10-CM | POA: Diagnosis not present

## 2015-01-17 LAB — COMPREHENSIVE METABOLIC PANEL
ALT: 11 U/L — AB (ref 14–54)
ANION GAP: 10 (ref 5–15)
AST: 25 U/L (ref 15–41)
Albumin: 3.1 g/dL — ABNORMAL LOW (ref 3.5–5.0)
Alkaline Phosphatase: 47 U/L (ref 38–126)
BILIRUBIN TOTAL: 0.7 mg/dL (ref 0.3–1.2)
BUN: 29 mg/dL — ABNORMAL HIGH (ref 6–20)
CHLORIDE: 96 mmol/L — AB (ref 101–111)
CO2: 30 mmol/L (ref 22–32)
Calcium: 8.3 mg/dL — ABNORMAL LOW (ref 8.9–10.3)
Creatinine, Ser: 1.46 mg/dL — ABNORMAL HIGH (ref 0.44–1.00)
GFR, EST AFRICAN AMERICAN: 34 mL/min — AB (ref >60)
GFR, EST NON AFRICAN AMERICAN: 29 mL/min — AB (ref >60)
GLUCOSE: 110 mg/dL — AB (ref 65–99)
POTASSIUM: 3.9 mmol/L (ref 3.5–5.1)
SODIUM: 136 mmol/L (ref 135–145)
Total Protein: 8.2 g/dL — ABNORMAL HIGH (ref 6.5–8.1)

## 2015-01-17 LAB — CBC WITH DIFFERENTIAL/PLATELET
BASOS ABS: 0.1 10*3/uL (ref 0–0.1)
Basophils Relative: 0 %
Eosinophils Absolute: 0 10*3/uL (ref 0–0.7)
Eosinophils Relative: 0 %
HCT: 32.8 % — ABNORMAL LOW (ref 35.0–47.0)
Hemoglobin: 10.7 g/dL — ABNORMAL LOW (ref 12.0–16.0)
LYMPHS PCT: 5 %
Lymphs Abs: 1.4 10*3/uL (ref 1.0–3.6)
MCH: 31 pg (ref 26.0–34.0)
MCHC: 32.6 g/dL (ref 32.0–36.0)
MCV: 95.1 fL (ref 80.0–100.0)
Monocytes Absolute: 1.3 10*3/uL — ABNORMAL HIGH (ref 0.2–0.9)
Monocytes Relative: 5 %
Neutro Abs: 24.2 10*3/uL — ABNORMAL HIGH (ref 1.4–6.5)
Neutrophils Relative %: 90 %
PLATELETS: 247 10*3/uL (ref 150–440)
RBC: 3.45 MIL/uL — ABNORMAL LOW (ref 3.80–5.20)
RDW: 14.3 % (ref 11.5–14.5)
WBC: 27 10*3/uL — ABNORMAL HIGH (ref 3.6–11.0)

## 2015-01-17 LAB — URINALYSIS COMPLETE WITH MICROSCOPIC (ARMC ONLY)
BACTERIA UA: NONE SEEN
Bilirubin Urine: NEGATIVE
GLUCOSE, UA: NEGATIVE mg/dL
Ketones, ur: NEGATIVE mg/dL
LEUKOCYTES UA: NEGATIVE
Nitrite: NEGATIVE
PROTEIN: 100 mg/dL — AB
Specific Gravity, Urine: 1.013 (ref 1.005–1.030)
Squamous Epithelial / LPF: NONE SEEN
pH: 6 (ref 5.0–8.0)

## 2015-01-17 LAB — PROTIME-INR
INR: 1.67
Prothrombin Time: 19.9 seconds — ABNORMAL HIGH (ref 11.4–15.0)

## 2015-01-17 LAB — LACTIC ACID, PLASMA: LACTIC ACID, VENOUS: 1.6 mmol/L (ref 0.5–2.0)

## 2015-01-17 LAB — TROPONIN I

## 2015-01-17 MED ORDER — VITAMIN D3 25 MCG (1000 UT) PO CAPS
1.0000 | ORAL_CAPSULE | Freq: Every day | ORAL | Status: DC
  Filled 2015-01-17: qty 1

## 2015-01-17 MED ORDER — ALBUTEROL SULFATE HFA 108 (90 BASE) MCG/ACT IN AERS: 2.0000 | INHALATION_SPRAY | Freq: Four times a day (QID) | RESPIRATORY_TRACT | Status: DC | PRN

## 2015-01-17 MED ORDER — ACETAMINOPHEN 500 MG PO TABS
1000.0000 mg | ORAL_TABLET | Freq: Once | ORAL | Status: AC
  Administered 2015-01-17: 20:00:00 via ORAL

## 2015-01-17 MED ORDER — SODIUM CHLORIDE 0.9 % IV BOLUS (SEPSIS): 960.0000 mL | Freq: Once | INTRAVENOUS | Status: DC

## 2015-01-17 MED ORDER — LETROZOLE 2.5 MG PO TABS
2.5000 mg | ORAL_TABLET | Freq: Every day | ORAL | Status: DC
Start: 2015-01-18 — End: 2015-01-28
  Administered 2015-01-18 – 2015-01-28 (×11): 2.5 mg via ORAL
  Filled 2015-01-17 (×11): qty 1

## 2015-01-17 MED ORDER — MAGNESIUM 250 MG PO TABS
2.0000 | ORAL_TABLET | Freq: Every day | ORAL | Status: DC
  Filled 2015-01-17: qty 2

## 2015-01-17 MED ORDER — METOPROLOL SUCCINATE ER 25 MG PO TB24
25.0000 mg | ORAL_TABLET | Freq: Every day | ORAL | Status: DC
  Administered 2015-01-18 – 2015-01-21 (×4): 25 mg via ORAL
  Filled 2015-01-17 (×4): qty 1

## 2015-01-17 MED ORDER — ACETAMINOPHEN 325 MG PO TABS
650.0000 mg | ORAL_TABLET | Freq: Four times a day (QID) | ORAL | Status: DC | PRN
  Administered 2015-01-25: 650 mg via ORAL
  Filled 2015-01-17: qty 2

## 2015-01-17 MED ORDER — MORPHINE SULFATE 2 MG/ML IJ SOLN: 2.0000 mg | INTRAMUSCULAR | Status: DC | PRN

## 2015-01-17 MED ORDER — IPRATROPIUM-ALBUTEROL 0.5-2.5 (3) MG/3ML IN SOLN
3.0000 mL | RESPIRATORY_TRACT | Status: DC
  Administered 2015-01-17 – 2015-01-18 (×5): 3 mL via RESPIRATORY_TRACT
  Filled 2015-01-17 (×4): qty 3

## 2015-01-17 MED ORDER — DILTIAZEM HCL ER COATED BEADS 120 MG PO CP24
120.0000 mg | ORAL_CAPSULE | Freq: Every day | ORAL | Status: DC
  Administered 2015-01-18 – 2015-01-28 (×11): 120 mg via ORAL
  Filled 2015-01-17 (×11): qty 1

## 2015-01-17 MED ORDER — LEVOFLOXACIN IN D5W 750 MG/150ML IV SOLN
INTRAVENOUS | Status: AC
  Filled 2015-01-17: qty 150

## 2015-01-17 MED ORDER — LEVOFLOXACIN IN D5W 750 MG/150ML IV SOLN
750.0000 mg | INTRAVENOUS | Status: DC
  Administered 2015-01-19: 750 mg via INTRAVENOUS
  Filled 2015-01-17: qty 150

## 2015-01-17 MED ORDER — CALCIUM CARBONATE-VITAMIN D 500-200 MG-UNIT PO TABS
1.0000 | ORAL_TABLET | Freq: Two times a day (BID) | ORAL | Status: DC
  Administered 2015-01-18 – 2015-01-28 (×21): 1 via ORAL
  Filled 2015-01-17 (×21): qty 1

## 2015-01-17 MED ORDER — LORATADINE 10 MG PO TABS
10.0000 mg | ORAL_TABLET | Freq: Every day | ORAL | Status: DC
  Administered 2015-01-18 – 2015-01-28 (×11): 10 mg via ORAL
  Filled 2015-01-17 (×12): qty 1

## 2015-01-17 MED ORDER — ACETAMINOPHEN 500 MG PO TABS
ORAL_TABLET | ORAL | Status: AC
  Filled 2015-01-17: qty 2

## 2015-01-17 MED ORDER — HEPARIN SODIUM (PORCINE) 5000 UNIT/ML IJ SOLN
5000.0000 [IU] | Freq: Three times a day (TID) | INTRAMUSCULAR | Status: DC
  Administered 2015-01-18 – 2015-01-23 (×16): 5000 [IU] via SUBCUTANEOUS
  Filled 2015-01-17 (×15): qty 1

## 2015-01-17 MED ORDER — ADULT MULTIVITAMIN W/MINERALS CH
1.0000 | ORAL_TABLET | Freq: Every day | ORAL | Status: DC
  Administered 2015-01-18 – 2015-01-28 (×11): 1 via ORAL
  Filled 2015-01-17 (×11): qty 1

## 2015-01-17 MED ORDER — SODIUM CHLORIDE 0.9 % IV SOLN
INTRAVENOUS | Status: DC
  Administered 2015-01-17 – 2015-01-22 (×7): via INTRAVENOUS

## 2015-01-17 MED ORDER — ONE-DAILY MULTI VITAMINS PO TABS: 1.0000 | ORAL_TABLET | Freq: Every day | ORAL | Status: DC

## 2015-01-17 MED ORDER — PIPERACILLIN-TAZOBACTAM 3.375 G IVPB
3.3750 g | Freq: Once | INTRAVENOUS | Status: AC
  Administered 2015-01-17: 3.375 g via INTRAVENOUS

## 2015-01-17 MED ORDER — LEVOFLOXACIN IN D5W 750 MG/150ML IV SOLN
750.0000 mg | Freq: Once | INTRAVENOUS | Status: AC
Start: 2015-01-17 — End: 2015-01-17
  Administered 2015-01-17: 750 mg via INTRAVENOUS

## 2015-01-17 MED ORDER — ALBUTEROL SULFATE (2.5 MG/3ML) 0.083% IN NEBU: 2.5000 mg | INHALATION_SOLUTION | Freq: Four times a day (QID) | RESPIRATORY_TRACT | Status: DC | PRN

## 2015-01-17 MED ORDER — ACETAMINOPHEN 650 MG RE SUPP: 650.0000 mg | Freq: Four times a day (QID) | RECTAL | Status: DC | PRN

## 2015-01-17 MED ORDER — ONDANSETRON HCL 4 MG/2ML IJ SOLN
4.0000 mg | Freq: Four times a day (QID) | INTRAMUSCULAR | Status: DC | PRN
  Administered 2015-01-21 – 2015-01-23 (×3): 4 mg via INTRAVENOUS
  Filled 2015-01-17 (×3): qty 2

## 2015-01-17 MED ORDER — VANCOMYCIN HCL IN DEXTROSE 1-5 GM/200ML-% IV SOLN
1000.0000 mg | Freq: Once | INTRAVENOUS | Status: AC
Start: 2015-01-17 — End: 2015-01-18
  Administered 2015-01-18: 1000 mg via INTRAVENOUS
  Filled 2015-01-17 (×3): qty 200

## 2015-01-17 MED ORDER — WARFARIN - PHYSICIAN DOSING INPATIENT
Freq: Every day | Status: DC
  Administered 2015-01-18 – 2015-01-20 (×2)

## 2015-01-17 MED ORDER — LISINOPRIL 20 MG PO TABS
20.0000 mg | ORAL_TABLET | Freq: Every day | ORAL | Status: DC
  Administered 2015-01-18 – 2015-01-28 (×11): 20 mg via ORAL
  Filled 2015-01-17 (×11): qty 1

## 2015-01-17 MED ORDER — ONDANSETRON HCL 4 MG PO TABS: 4.0000 mg | ORAL_TABLET | Freq: Four times a day (QID) | ORAL | Status: DC | PRN

## 2015-01-17 MED ORDER — PIPERACILLIN-TAZOBACTAM 3.375 G IVPB
INTRAVENOUS | Status: AC
  Administered 2015-01-17: 3.375 g via INTRAVENOUS
  Filled 2015-01-17: qty 50

## 2015-01-17 MED ORDER — SODIUM CHLORIDE 0.9 % IV BOLUS (SEPSIS): 1960.0000 mL | Freq: Once | INTRAVENOUS | Status: DC

## 2015-01-17 MED ORDER — SODIUM CHLORIDE 0.9 % IV BOLUS (SEPSIS)
1000.0000 mL | Freq: Once | INTRAVENOUS | Status: AC
  Administered 2015-01-17: 1000 mL via INTRAVENOUS

## 2015-01-17 MED ORDER — WARFARIN SODIUM 1 MG PO TABS
2.0000 mg | ORAL_TABLET | Freq: Every day | ORAL | Status: DC
  Administered 2015-01-18 – 2015-01-20 (×3): 2 mg via ORAL
  Filled 2015-01-17 (×3): qty 2

## 2015-01-17 NOTE — ED Notes (Signed)
Pt placed on NRB oxygen level now 100%

## 2015-01-17 NOTE — ED Notes (Signed)
O2 sats remain 99% on 4 liter [place],   Decreased O2 to 2 liters via [place]

## 2015-01-17 NOTE — H&P (Addendum)
[place] [facility] [place] NAME: Jill Ellison    MR#:  [id number]  DATE OF BIRTH:  [birth year]   DATE OF ADMISSION:  01/17/2015  PRIMARY CARE PHYSICIAN: Margarita Rana, MD   REQUESTING/REFERRING PHYSICIAN: Edd Fabian  CHIEF COMPLAINT:   Chief Complaint  Patient presents with  . Emesis   fever, shortness of breath  HISTORY OF PRESENT ILLNESS:  Jill Ellison  is a 81 y.o. female with a known history of chronic atrial fibrillation on warfarin, diastolic congestive heart failure presenting with fever and shortness of breath. Patient is unable to provide meaningful information given mental status at baseline, history aided from family members at bedside. They described one day duration of nausea/vomiting of nonbloody nonbilious emesis approximately 3 bouts, followed by cough, nonproductive. They called EMS for the above symptoms on their arrival the patient was saturating 79% with increased work of breathing. Emergency department course: She was noted to be febrile with increased work of breathing, originally placed on nonrebreather, fortunately her condition has improved she is now on nasal cannula.  PAST MEDICAL HISTORY:   Past Medical History  Diagnosis Date  . Swelling of ankle   . SOB (shortness of breath)   . Hypertension   . Asthma   . Arthritis   . Atrial fibrillation 2011  . Cancer 2011    Left breast  . Cancer of leg   . Malignant neoplasm of upper-outer quadrant of female breast     left   . Memory loss   . Dementia   . GERD (gastroesophageal reflux disease)   . Hyperlipidemia   . Anemia   . Iron deficiency   . History of colonic polyps   . Lumbago     PAST SURGICAL HISTORY:   Past Surgical History  Procedure Laterality Date  . Skin cancer excision  2008    right leg  . Mastectomy    . Colonoscopy  2010  . Breast surgery Left 03/2010    mastectomy  . Breast biopsy Left 2009    SOCIAL HISTORY:   History   Substance Use Topics  . Smoking status: Never Smoker   . Smokeless tobacco: Never Used  . Alcohol Use: No    FAMILY HISTORY:   Family History  Problem Relation Age of Onset  . Cancer Mother   . Cancer Father   . Cancer Brother   . Hypertension Brother   . Heart attack Brother   . Coronary artery disease Brother   . Ovarian cancer Sister     DRUG ALLERGIES:  No Known Allergies  REVIEW OF SYSTEMS:  REVIEW OF SYSTEMS:  Unobtainable given patient's baseline mental status   MEDICATIONS AT HOME:   Prior to Admission medications   Medication Sig Start Date End Date Taking? Authorizing Provider  calcium-vitamin D (OSCAL WITH D) 500-200 MG-UNIT per tablet Take 1 tablet by mouth 2 (two) times daily.     Yes Historical Provider, MD  Cholecalciferol (VITAMIN D3) 1000 UNITS CAPS Take 1 capsule by mouth daily.     Yes Historical Provider, MD  diltiazem (CARDIZEM CD) 120 MG 24 hr capsule Take 1 capsule (120 mg total) by mouth daily. 10/13/14  Yes Minna Merritts, MD  furosemide (LASIX) 20 MG tablet TAKE 1 TABLET EVERY DAY 12/09/12  Yes Minna Merritts, MD  KLOR-CON M20 20 MEQ tablet TAKE 1 TABLET BY MOUTH EVERY DAY 11/30/12  Yes Minna Merritts, MD  letrozole [facility]) 2.5  MG tablet Take 2.5 mg by mouth daily.     Yes Historical Provider, MD  lisinopril (PRINIVIL,ZESTRIL) 20 MG tablet TAKE 1 TABLET BY MOUTH DAILY 04/09/12  Yes Minna Merritts, MD  Magnesium 250 MG TABS Take 2 tablets by mouth daily.    Yes Historical Provider, MD  metoprolol succinate (TOPROL-XL) 25 MG 24 hr tablet Take 25 mg by mouth daily.   Yes Historical Provider, MD  Multiple Vitamin (MULTIVITAMIN) tablet Take 1 tablet by mouth daily.     Yes Historical Provider, MD  warfarin (COUMADIN) 5 MG tablet Take 5 mg by mouth daily.   Yes Historical Provider, MD  fexofenadine (ALLEGRA) 180 MG tablet Take 180 mg by mouth daily as needed for allergies.     Historical Provider, MD  levalbuterol [facility] HFA) 45 MCG/ACT inhaler  Inhale 1-2 puffs into the lungs every 4 (four) hours as needed.      Historical Provider, MD  PROAIR HFA 108 (90 BASE) MCG/ACT inhaler Inhale 2 puffs into the lungs every 6 (six) hours as needed for wheezing or shortness of breath.  12/09/14   Historical Provider, MD  warfarin (COUMADIN) 2 MG tablet TAKE AS DIRECTED BY ANTICOAGULATION CLINIC Patient not taking: Reported on 01/17/2015 10/27/12   Minna Merritts, MD      VITAL SIGNS:  Blood pressure 137/61, pulse 119, temperature 99.5 F (37.5 C), temperature source Oral, resp. rate 24, height 5\' 1"  (1.549 m), weight 144 lb (65.318 kg), SpO2 98 %.  PHYSICAL EXAMINATION:  VITAL SIGNS: Filed Vitals:   01/17/15 2031  BP: 137/61  Pulse: 119  Temp:   Resp: 24   GENERAL:81 y.o.female minimal  Distress, given her respiratory status HEAD: Normocephalic, atraumatic.  EYES: Pupils equal, round, reactive to light. Extraocular muscles intact. No scleral icterus.  MOUTH: Dry mucosal membrane. Dentition poor. No abscess noted.  EAR, NOSE, THROAT: Clear without exudates. No external lesions.  NECK: Supple. No thyromegaly. No nodules. No JVD.  PULMONARY: Left upper coarse rhonchi. Tachypneic Without use of accessory muscles. good air entry bilaterally CHEST: Nontender to palpation.  CARDIOVASCULAR: S1 and S2. Irregular rate and rhythm. No murmurs, rubs, or gallops. No edema. Pedal pulses 2+ bilaterally.  GASTROINTESTINAL: Soft, nontender, nondistended. No masses. Positive bowel sounds. No hepatosplenomegaly.  MUSCULOSKELETAL: No swelling, clubbing, or edema. Range of motion full in all extremities.  NEUROLOGIC: Cranial nerves II through XII are intact. No gross focal neurological deficits. Sensation intact. Reflexes intact.  SKIN: No ulceration, lesions, rashes, or cyanosis. Skin warm and dry. Turgor intact.  PSYCHIATRIC: Mood, affect flattened. The patient is awake, alert and oriented to person. Insight, judgment poor   LABORATORY PANEL:    CBC  Recent Labs Lab 01/17/15 1850  WBC 27.0*  HGB 10.7*  HCT 32.8*  PLT 247   ------------------------------------------------------------------------------------------------------------------  Chemistries   Recent Labs Lab 01/17/15 1850  NA 136  K 3.9  CL 96*  CO2 30  GLUCOSE 110*  BUN 29*  CREATININE 1.46*  CALCIUM 8.3*  AST 25  ALT 11*  ALKPHOS 47  BILITOT 0.7   ------------------------------------------------------------------------------------------------------------------  Cardiac Enzymes  Recent Labs Lab 01/17/15 1850  TROPONINI <0.03   ------------------------------------------------------------------------------------------------------------------  RADIOLOGY:  Dg Chest Portable 1 View  01/17/2015   CLINICAL DATA:  Vomiting.  EXAM: PORTABLE CHEST - 1 VIEW  COMPARISON:  09/16/2014  FINDINGS: Mild cardiac enlargement. There is calcified atherosclerotic disease involving the aortic arch. Left upper lobe airspace consolidation is new from previous exam. No pleural effusion or  edema.  IMPRESSION: 1. Left upper lobe pneumonia. Followup PA and lateral chest X-ray is recommended in 3-4 weeks following trial of antibiotic therapy to ensure resolution and exclude underlying malignancy. 2. Cardiac enlargement and aortic atherosclerosis.   Electronically Signed   By: Kerby Moors M.D.   On: 01/17/2015 19:23    EKG:   Orders placed or performed during the hospital encounter of 01/17/15  . ED EKG  . ED EKG    IMPRESSION AND PLAN:   81 year old Caucasian female history of diastolic congestive heart failure, chronic atrial fibrillation on warfarin for anticoagulation presenting with one-day duration nausea/vomiting as well as fever and shortness of breath.  1.Sepsis, meeting septic criteria by heart rate, temperature, respiratory rate, leukocytosis present on arrival. Source community-acquired pneumonia Code sepsis not ordered. Panculture. Broad-spectrum  antibiotics including Levaquin, Zosyn, vancomycin-will continue Levaquin and taper antibiotics when culture data returns. Continue IV fluid hydration to keep mean arterial pressure greater than 65.   2. Atrial fibrillation with rapid ventricular response: Provide IV fluid hydration, treat fever, if heart rate is persistently greater than 120 will require Cardizem drip and continue warfarin for anticoagulation, check INR  3. Hypertension, essential: Continue lisinopril, metoprolol. 4. Chronic diastolic heart failure: Continue beta-blockade, lisinopril, hold diuretics for now 5.Venous thromboembolism prophylactic: Therapeutic anticoagulation     All the records are reviewed and case discussed with ED provider. Management plans discussed with the patient, family and they are in agreement.  CODE STATUS: Full, as discussed with family at bedside  [place] THIS PATIENT: Critical care 55 minutes.    Haifa Hatton,  Karenann Cai.D on 01/17/2015 at 8:54 PM  Between 7am to 6pm - Pager - [phone]  After 6pm: House Pager: - [phone]  [facility]  Office  [phone]  CC: Primary care physician; Margarita Rana, MD

## 2015-01-17 NOTE — ED Notes (Signed)
Admitting physician at bedside to evaluate pt

## 2015-01-17 NOTE — ED Notes (Signed)
Per EMS family called due to patient having vomiting a few times since 0400. Pt declines any pain.  Pt has hx of dementia.  Pt oxygen level 79% upon arrival and immediately placed on NRB.

## 2015-01-17 NOTE — Progress Notes (Signed)
ANTIBIOTIC CONSULT NOTE - INITIAL  Pharmacy Consult for Levaquin Indication: pneumonia  No Known Allergies  Patient Measurements: Height: 5\' 1"  (154.9 cm) Weight: 144 lb (65.318 kg) IBW/kg (Calculated) : 47.8  Vital Signs: Temp: 98.2 F (36.8 C) (05/09 2127) Temp Source: Oral (05/09 2127) BP: 98/66 mmHg (05/09 2300) Pulse Rate: 132 (05/09 2300) Intake/Output from previous day:   Intake/Output from this shift: Total I/O In: 250 [IV Piggyback:250] Out: -   Labs:  Recent Labs  01/17/15 1850  WBC 27.0*  HGB 10.7*  PLT 247  CREATININE 1.46*   Estimated Creatinine Clearance: 19.5 mL/min (by C-G formula based on Cr of 1.46). No results for input(s): VANCOTROUGH, VANCOPEAK, VANCORANDOM, GENTTROUGH, GENTPEAK, GENTRANDOM, TOBRATROUGH, TOBRAPEAK, TOBRARND, AMIKACINPEAK, AMIKACINTROU, AMIKACIN in the last 72 hours.   Microbiology: No results found for this or any previous visit (from the past 720 hour(s)).  Medical History: Past Medical History  Diagnosis Date  . Swelling of ankle   . SOB (shortness of breath)   . Hypertension   . Asthma   . Arthritis   . Atrial fibrillation 2011  . Cancer 2011    Left breast  . Cancer of leg   . Malignant neoplasm of upper-outer quadrant of female breast     left   . Memory loss   . Dementia   . GERD (gastroesophageal reflux disease)   . Hyperlipidemia   . Anemia   . Iron deficiency   . History of colonic polyps   . Lumbago     Medications:  Anti-infectives    Start     Dose/Rate Route Frequency Ordered Stop   01/19/15 1800  levofloxacin (LEVAQUIN) IVPB 750 mg     750 mg 100 mL/hr over 90 Minutes Intravenous Every 48 hours 01/17/15 2249     01/17/15 1945  levofloxacin (LEVAQUIN) IVPB 750 mg     750 mg 100 mL/hr over 90 Minutes Intravenous  Once 01/17/15 1940 01/17/15 2155   01/17/15 1900  vancomycin (VANCOCIN) IVPB 1000 mg/200 mL premix     1,000 mg 200 mL/hr over 60 Minutes Intravenous  Once 01/17/15 1851     01/17/15 1900  piperacillin-tazobactam (ZOSYN) IVPB 3.375 g     3.375 g 12.5 mL/hr over 240 Minutes Intravenous  Once 01/17/15 1851 01/17/15 1930     Assessment: CAP with borderline renal function.  Plan:  Follow up culture results Will order Levaquin 750 mg iv q 48 h for now and f/u SCr. May need to adjust to 500 mg  iv q 48 h if renal function worsens.   Ulice Dash D 01/17/2015,11:43 PM

## 2015-01-17 NOTE — ED Notes (Signed)
Vancomycin is out of stock in the [facility].    Pharmacy notified and will send.

## 2015-01-17 NOTE — ED Provider Notes (Signed)
[facility] Emergency Department Provider Note  ____________________________________________  Time seen: Approximately 6:53 PM  I have reviewed the triage vital signs and the nursing notes.   HISTORY  Chief Complaint Emesis  History of present illness Limited due to patient's dementia, history of present illness is obtained from family at bedside.  HPI Jill Ellison is a 81 y.o. female history of dementia, atrial fibrillation, hypertension, GERD, hyperlipidemia, anemia presents for evaluation of fever and 3 episodes of nonbloody nonbilious emesis today. She went to sleep last night in her usual state of health. Symptoms began suddenly this morning of approximately 4:30 AM. They have been waxing and waning in severity. Current severity 3 out of 10. No recent changes in medications. No modifying factors. No recent cough, sneezing, runny nose, congestion. She has had prior urinary tract infections.   Past Medical History  Diagnosis Date  . Swelling of ankle   . SOB (shortness of breath)   . Hypertension   . Asthma   . Arthritis   . Atrial fibrillation 2011  . Cancer 2011    Left breast  . Cancer of leg   . Malignant neoplasm of upper-outer quadrant of female breast     left   . Memory loss   . Dementia   . GERD (gastroesophageal reflux disease)   . Hyperlipidemia   . Anemia   . Iron deficiency   . History of colonic polyps   . Lumbago     Patient Active Problem List   Diagnosis Date Noted  . Community acquired pneumonia 01/17/2015  . Diastolic CHF, chronic 43/32/9518  . Anemia 10/13/2014  . HTN (hypertension) 03/24/2011  . Long term current use of anticoagulant 11/25/2010  . HYPERTENSION, BENIGN 03/06/2010  . ATRIAL FIBRILLATION 03/06/2010    Past Surgical History  Procedure Laterality Date  . Skin cancer excision  2008    right leg  . Mastectomy    . Colonoscopy  2010  . Breast surgery Left 03/2010    mastectomy  . Breast biopsy  Left 2009    Current Outpatient Rx  Name  Route  Sig  Dispense  Refill  . calcium-vitamin D (OSCAL WITH D) 500-200 MG-UNIT per tablet   Oral   Take 1 tablet by mouth 2 (two) times daily.           . Cholecalciferol (VITAMIN D3) 1000 UNITS CAPS   Oral   Take 1 capsule by mouth daily.           [name] diltiazem (CARDIZEM CD) 120 MG 24 hr capsule   Oral   Take 1 capsule (120 mg total) by mouth daily.   30 capsule   11   . furosemide (LASIX) 20 MG tablet      TAKE 1 TABLET EVERY DAY   30 tablet   6   . KLOR-CON M20 20 MEQ tablet      TAKE 1 TABLET BY MOUTH EVERY DAY   30 tablet   3   . letrozole (FEMARA) 2.5 MG tablet   Oral   Take 2.5 mg by mouth daily.           [name] lisinopril (PRINIVIL,ZESTRIL) 20 MG tablet      TAKE 1 TABLET BY MOUTH DAILY   30 tablet   6   . Magnesium 250 MG TABS   Oral   Take 2 tablets by mouth daily.          . metoprolol succinate (TOPROL-XL) 25  MG 24 hr tablet   Oral   Take 25 mg by mouth daily.         . Multiple Vitamin (MULTIVITAMIN) tablet   Oral   Take 1 tablet by mouth daily.           [name] warfarin (COUMADIN) 5 MG tablet   Oral   Take 5 mg by mouth daily.         . fexofenadine (ALLEGRA) 180 MG tablet   Oral   Take 180 mg by mouth daily as needed for allergies.          [name] levalbuterol (XOPENEX HFA) 45 MCG/ACT inhaler   Inhalation   Inhale 1-2 puffs into the lungs every 4 (four) hours as needed.           [name] PROAIR HFA 108 (90 BASE) MCG/ACT inhaler   Inhalation   Inhale 2 puffs into the lungs every 6 (six) hours as needed for wheezing or shortness of breath.       2     Dispense as written.   . warfarin (COUMADIN) 2 MG tablet      TAKE AS DIRECTED BY ANTICOAGULATION CLINIC Patient not taking: Reported on 01/17/2015   75 tablet   3     30 day supply     Allergies Review of patient's allergies indicates no known allergies.  Family History  Problem Relation Age of Onset  . Cancer Mother   . Cancer  Father   . Cancer Brother   . Hypertension Brother   . Heart attack Brother   . Coronary artery disease Brother   . Ovarian cancer Sister     Social History History  Substance Use Topics  . Smoking status: Never Smoker   . Smokeless tobacco: Never Used  . Alcohol Use: No    Review of Systems Constitutional: +/chills Respiratory: Denies shortness of breath. Gastrointestinal: + vomiting.  No diarrhea Partial review of systems obtained from family at bedside. Unable to obtain full review of systems secondary to the patient's dementia.  ____________________________________________   PHYSICAL EXAM:  VITAL SIGNS: ED Triage Vitals  Enc Vitals Group     BP 01/17/15 1830 141/76 mmHg     Pulse Rate 01/17/15 1830 126     Resp 01/17/15 1830 24     Temp 01/17/15 1830 101 F (38.3 C)     Temp Source 01/17/15 1830 Oral     SpO2 01/17/15 1830 79 %     Weight 01/17/15 1830 144 lb (65.318 kg)     Height 01/17/15 1830 5\' 1"  (1.549 m)     Head Cir --      Peak Flow --      Pain Score --      Pain Loc --      Pain Edu? --      Excl. in [place]? --     Constitutional: Alert demented, ill-appearing but in no acute distress. Eyes: Conjunctivae are normal. PERRL. EOMI. Head: Atraumatic. Nose: No congestion/rhinnorhea. Mouth/Throat: Mucous membranes are dry.  Oropharynx non-erythematous. Neck: No stridor.   Cardiovascular: Tachycardic rate, irregularly irregular rhythm, Good peripheral circulation. Respiratory: Increased work of breathing, tachypnea, decreased breath sounds in the left lobes Gastrointestinal: Soft and nontender. No distention. No abdominal bruits. No CVA tenderness. Genitourinary: deferred Musculoskeletal: No lower extremity tenderness nor edema.  No joint effusions. Neurologic:  Normal speech and language. No gross focal neurologic deficits are appreciated. Speech is normal. Into to self and place but not  to year. Follows commands to move all extremities which she does  equally Skin:  Skin is warm, dry and intact. No rash noted. Psychiatric: Mood and affect are normal. Speech and behavior are normal.  ____________________________________________   LABS (all labs ordered are listed, but only abnormal results are displayed)  Labs Reviewed  COMPREHENSIVE METABOLIC PANEL - Abnormal; Notable for the following:    Chloride 96 (*)    Glucose, Bld 110 (*)    BUN 29 (*)    Creatinine, Ser 1.46 (*)    Calcium 8.3 (*)    Total Protein 8.2 (*)    Albumin 3.1 (*)    ALT 11 (*)    GFR calc non Af Amer 29 (*)    GFR calc Af Amer 34 (*)    All other components within normal limits  CBC WITH DIFFERENTIAL/PLATELET - Abnormal; Notable for the following:    WBC 27.0 (*)    RBC 3.45 (*)    Hemoglobin 10.7 (*)    HCT 32.8 (*)    Neutro Abs 24.2 (*)    Monocytes Absolute 1.3 (*)    All other components within normal limits  PROTIME-INR - Abnormal; Notable for the following:    Prothrombin Time 19.9 (*)    All other components within normal limits  CULTURE, BLOOD (ROUTINE X 2)  CULTURE, BLOOD (ROUTINE X 2)  TROPONIN I  LACTIC ACID, PLASMA  LACTIC ACID, PLASMA  LIPASE, BLOOD  URINALYSIS COMPLETEWITH MICROSCOPIC ([facility])   CBC  CREATININE, SERUM  BASIC METABOLIC PANEL  CBC   ____________________________________________  EKG  ED ECG REPORT   Date: 01/17/2015  EKG Time: 19:15   Rate: 111  Rhythm:  atrial fibrillation, rate 111  Axis: normal  Intervals:none  ST&T Change: No acute ST segment change  ____________________________________________  RADIOLOGY  CXR: IMPRESSION: 1. Left upper lobe pneumonia. Followup PA and lateral chest X-ray is recommended in 3-4 weeks following trial of antibiotic therapy to ensure resolution and exclude underlying malignancy. 2. Cardiac enlargement and aortic atherosclerosis.   ____________________________________________   PROCEDURES  Procedure(s) performed: None  Critical Care performed: Yes, see  critical care note(s). Total critical care time spent 50 minutes.  ____________________________________________   INITIAL IMPRESSION / ASSESSMENT AND PLAN / ED COURSE  Pertinent labs & imaging results that were available during my care of the patient were reviewed by me and considered in my medical decision making (see chart for details).  Jill Ellison is a 81 y.o. female history of dementia, atrial fibrillation, hypertension, GERD, hyperlipidemia, anemia presents for evaluation of fever and 3 episodes of nonbloody nonbilious emesis today. On arrival, meeting 3 out of 4 SIRS criteria for fever or cardiac, tachypnea. She is newly hypoxic to 79%, 100% on nonrebreather. Will give IV fluids but to provide gentle hydration given question of CHF on Lasix. IV Vanco and Zosyn given. Anticipate admission.   ----------------------------------------- 8:38 PM on 01/17/2015 -----------------------------------------  Chest x-ray shows left upper lobe pneumonia as the most likely source of sepsis.  Levaquin added. On chart review, Dr. Rockey Situ does not mention any history of CHF during his clinic note from February so we'll proceed with 30 mL/kg normal saline bolus. Sats 99% on  Nasal cannula. BP stable. Discussed with hospitalist for admission. ____________________________________________   FINAL CLINICAL IMPRESSION(S) / ED DIAGNOSES  Final diagnoses:  Sepsis, due to unspecified organism  Community acquired pneumonia      Joanne Gavel, MD 01/17/15 2041

## 2015-01-17 NOTE — ED Notes (Signed)
Assumed care of patient.  Dr Edd Fabian at bedside to discuss plan of care with patient and family.

## 2015-01-17 NOTE — ED Notes (Signed)
The vancomycin ordered has not come up from pharmacy yet,   Called back and talked with Jeral Fruit in the pharmacy.  She said she will send it up.

## 2015-01-18 DIAGNOSIS — J189 Pneumonia, unspecified organism: Secondary | ICD-10-CM

## 2015-01-18 DIAGNOSIS — Z853 Personal history of malignant neoplasm of breast: Secondary | ICD-10-CM

## 2015-01-18 DIAGNOSIS — F0390 Unspecified dementia without behavioral disturbance: Secondary | ICD-10-CM

## 2015-01-18 DIAGNOSIS — Z515 Encounter for palliative care: Secondary | ICD-10-CM

## 2015-01-18 DIAGNOSIS — I10 Essential (primary) hypertension: Secondary | ICD-10-CM

## 2015-01-18 DIAGNOSIS — Z79899 Other long term (current) drug therapy: Secondary | ICD-10-CM

## 2015-01-18 DIAGNOSIS — I4891 Unspecified atrial fibrillation: Secondary | ICD-10-CM

## 2015-01-18 LAB — CBC
HEMATOCRIT: 28.9 % — AB (ref 35.0–47.0)
HEMOGLOBIN: 9.2 g/dL — AB (ref 12.0–16.0)
MCH: 30.5 pg (ref 26.0–34.0)
MCHC: 31.7 g/dL — ABNORMAL LOW (ref 32.0–36.0)
MCV: 96 fL (ref 80.0–100.0)
Platelets: 196 10*3/uL (ref 150–440)
RBC: 3.02 MIL/uL — ABNORMAL LOW (ref 3.80–5.20)
RDW: 13.9 % (ref 11.5–14.5)
WBC: 24.9 10*3/uL — ABNORMAL HIGH (ref 3.6–11.0)

## 2015-01-18 LAB — BASIC METABOLIC PANEL
Anion gap: 9 (ref 5–15)
BUN: 33 mg/dL — ABNORMAL HIGH (ref 6–20)
CO2: 28 mmol/L (ref 22–32)
CREATININE: 1.66 mg/dL — AB (ref 0.44–1.00)
Calcium: 7.8 mg/dL — ABNORMAL LOW (ref 8.9–10.3)
Chloride: 101 mmol/L (ref 101–111)
GFR calc Af Amer: 29 mL/min — ABNORMAL LOW (ref >60)
GFR, EST NON AFRICAN AMERICAN: 25 mL/min — AB (ref >60)
GLUCOSE: 92 mg/dL (ref 65–99)
POTASSIUM: 3.7 mmol/L (ref 3.5–5.1)
Sodium: 138 mmol/L (ref 135–145)

## 2015-01-18 LAB — LACTIC ACID, PLASMA: Lactic Acid, Venous: 1.4 mmol/L (ref 0.5–2.0)

## 2015-01-18 LAB — LIPASE, BLOOD: LIPASE: 23 U/L (ref 22–51)

## 2015-01-18 LAB — GLUCOSE, CAPILLARY: Glucose-Capillary: 110 mg/dL — ABNORMAL HIGH (ref 70–99)

## 2015-01-18 MED ORDER — MAGNESIUM OXIDE 400 (241.3 MG) MG PO TABS
800.0000 mg | ORAL_TABLET | Freq: Every day | ORAL | Status: DC
  Administered 2015-01-18 – 2015-01-28 (×11): 800 mg via ORAL
  Filled 2015-01-18 (×11): qty 2

## 2015-01-18 MED ORDER — DILTIAZEM HCL 60 MG PO TABS
60.0000 mg | ORAL_TABLET | Freq: Once | ORAL | Status: AC
  Administered 2015-01-18: 60 mg via ORAL
  Filled 2015-01-18: qty 1

## 2015-01-18 MED ORDER — VANCOMYCIN HCL IN DEXTROSE 1-5 GM/200ML-% IV SOLN
1000.0000 mg | INTRAVENOUS | Status: DC
  Filled 2015-01-18: qty 200

## 2015-01-18 MED ORDER — VITAMIN D3 25 MCG (1000 UNIT) PO TABS
1000.0000 [IU] | ORAL_TABLET | Freq: Every day | ORAL | Status: DC
  Administered 2015-01-18 – 2015-01-28 (×11): 1000 [IU] via ORAL
  Filled 2015-01-18 (×22): qty 1

## 2015-01-18 MED ORDER — VANCOMYCIN HCL IN DEXTROSE 1-5 GM/200ML-% IV SOLN
1000.0000 mg | INTRAVENOUS | Status: DC
  Administered 2015-01-18: 1000 mg via INTRAVENOUS
  Filled 2015-01-18 (×2): qty 200

## 2015-01-18 MED ORDER — VANCOMYCIN HCL IN DEXTROSE 750-5 MG/150ML-% IV SOLN
750.0000 mg | Freq: Once | INTRAVENOUS | Status: DC
  Filled 2015-01-18: qty 150

## 2015-01-18 MED ORDER — PIPERACILLIN-TAZOBACTAM 3.375 G IVPB
3.3750 g | Freq: Three times a day (TID) | INTRAVENOUS | Status: DC
  Administered 2015-01-18 – 2015-01-19 (×3): 3.375 g via INTRAVENOUS
  Filled 2015-01-18 (×7): qty 50

## 2015-01-18 NOTE — Consult Note (Signed)
Palliative Medicine Inpatient Consult Note   Name: Jill Ellison Date: 01/18/2015 MRN: [id number]  DOB: July 06, 1917  Referring Physician: Aldean Jewett, MD  Palliative Care consult requested for this 81 y.o. female for goals of medical therapy in patient with dementia, HTN, hyperlipidemia, a.fib, diastolic dysfunction, admitted with pneumonia & rapid a.fib  Jill Ellison is a 81 yo woman with PMH of HTN, hyperlipidemia, a.fib, asthma, dementia, diastolic dysfunction, OA, OP s/p L.hip fx (12/2012), L.breast cancer s/p mastectomy (03/2010),  Iron def anemia, colon polyps. She was admitted 01/17/15 with n/v, shortness of breath, fever. 02 sats reportedly 79% and CXR shows LUL pneumonia. Pt also in a.fib with RVR. At present, pt is lying in bed in CCU. Sleeping but arousable. Very pleasant, no complaints. Family not present.    REVIEW OF SYSTEMS:  Pain: None Dyspnea:  No Nausea/Vomiting:  No Diarrhea:  No Constipation:   No Depression:   No Anxiety:   No Fatigue:   No  SOCIAL HISTORY: Pt is widowed. She has a daughter. She lives with her granddaughter.   reports that she has never smoked. She has never used smokeless tobacco. She reports that she does not drink alcohol or use illicit drugs.  LEGAL DOCUMENTS:  none  CODE STATUS: Full code  PAST MEDICAL HISTORY: Past Medical History  Diagnosis Date  . Swelling of ankle   . SOB (shortness of breath)   . Hypertension   . Asthma   . Arthritis   . Atrial fibrillation 2011  . Cancer 2011    Left breast  . Cancer of leg   . Malignant neoplasm of upper-outer quadrant of female breast     left   . Memory loss   . Dementia   . GERD (gastroesophageal reflux disease)   . Hyperlipidemia   . Anemia   . Iron deficiency   . History of colonic polyps   . Lumbago     PAST SURGICAL HISTORY:  Past Surgical History  Procedure Laterality Date  . Skin cancer excision  2008    right leg  . Mastectomy    . Colonoscopy  2010  .  Breast surgery Left 03/2010    mastectomy  . Breast biopsy Left 2009    ALLERGIES:  has No Known Allergies.  MEDICATIONS:  Current Facility-Administered Medications  Medication Dose Route Frequency Provider Last Rate Last Dose  . 0.9 %  sodium chloride infusion   Intravenous Continuous Lytle Butte, MD 75 mL/hr at 01/17/15 2045    . acetaminophen (TYLENOL) tablet 650 mg  650 mg Oral Q6H PRN Lytle Butte, MD       Or  . acetaminophen (TYLENOL) suppository 650 mg  650 mg Rectal Q6H PRN Lytle Butte, MD      . albuterol (PROVENTIL) (2.5 MG/3ML) 0.083% nebulizer solution 2.5 mg  2.5 mg Nebulization Q6H PRN Lytle Butte, MD      . calcium-vitamin D (OSCAL WITH D) 500-200 MG-UNIT per tablet 1 tablet  1 tablet Oral BID Lytle Butte, MD   1 tablet at 01/18/15 0934  . cholecalciferol (VITAMIN D) tablet 1,000 Units  1,000 Units Oral Daily Lytle Butte, MD   1,000 Units at 01/18/15 [phone]  . diltiazem (CARDIZEM CD) 24 hr capsule 120 mg  120 mg Oral Daily Lytle Butte, MD   120 mg at 01/18/15 0934  . heparin injection 5,000 Units  5,000 Units Subcutaneous 3 times per day Lytle Butte, MD  5,000 Units at 01/18/15 0619  . ipratropium-albuterol (DUONEB) 0.5-2.5 (3) MG/3ML nebulizer solution 3 mL  3 mL Nebulization Q4H Lytle Butte, MD   3 mL at 01/18/15 4098  . letrozole [facility]) tablet 2.5 mg  2.5 mg Oral Daily Lytle Butte, MD   2.5 mg at 01/18/15 0934  . [START ON 01/19/2015] levofloxacin (LEVAQUIN) IVPB 750 mg  750 mg Intravenous Q48H Lytle Butte, MD      . lisinopril (PRINIVIL,ZESTRIL) tablet 20 mg  20 mg Oral Daily Lytle Butte, MD   20 mg at 01/18/15 0934  . loratadine (CLARITIN) tablet 10 mg  10 mg Oral Daily Lytle Butte, MD   10 mg at 01/18/15 0934  . magnesium oxide (MAG-OX) tablet 800 mg  800 mg Oral Daily Lytle Butte, MD   800 mg at 01/18/15 0946  . metoprolol succinate (TOPROL-XL) 24 hr tablet 25 mg  25 mg Oral Daily Lytle Butte, MD   25 mg at 01/18/15 0934  . morphine 2 MG/ML  injection 2 mg  2 mg Intravenous Q4H PRN Lytle Butte, MD      . multivitamin with minerals tablet 1 tablet  1 tablet Oral Daily Lytle Butte, MD   1 tablet at 01/18/15 0934  . ondansetron (ZOFRAN) tablet 4 mg  4 mg Oral Q6H PRN Lytle Butte, MD       Or  . ondansetron [facility]) injection 4 mg  4 mg Intravenous Q6H PRN Lytle Butte, MD      . warfarin (COUMADIN) tablet 2 mg  2 mg Oral q1800 Lytle Butte, MD      . Warfarin - Physician Dosing Inpatient   Does not apply q1800 Lytle Butte, MD        Vital Signs: BP 98/56 mmHg  Pulse 114  Temp(Src) 98 F (36.7 C) (Oral)  Resp 25  Ht 5\' 1"  (1.549 m)  Wt 65.318 kg (144 lb)  BMI 27.22 kg/m2  SpO2 97% Filed Weights   01/17/15 1830  Weight: 65.318 kg (144 lb)    Estimated body mass index is 27.22 kg/(m^2) as calculated from the following:   Height as of this encounter: 5\' 1"  (1.549 m).   Weight as of this encounter: 65.318 kg (144 lb).  PHYSICAL EXAM:  General: NAD HEENT: OP clear, poor dentition, decreased hearing Neck: Trachea midline  Cardiovascular: irregular rate and rhythm, tachycardic Pulmonary/Chest: fair air movement, coarse BS's ant fields Abdominal: Soft, NTTP, + bowel sounds GU: No SP tenderness Extremities: No edema  Neurological: Grossly nonfocal Skin: no rashes Psychiatric: alert, oriented to person, place, not year   LABS: CBC:  Recent Labs Lab 01/17/15 1850 01/18/15 0714  WBC 27.0* 24.9*  HGB 10.7* 9.2*  HCT 32.8* 28.9*  PLT 247 196   Comprehensive Metabolic Panel:  Recent Labs Lab 01/17/15 1850 01/18/15 0714  NA 136 138  K 3.9 3.7  CL 96* 101  CO2 30 28  GLUCOSE 110* 92  BUN 29* 33*  CREATININE 1.46* 1.66*  CALCIUM 8.3* 7.8*  AST 25  --   ALT 11*  --   ALKPHOS 47  --   BILITOT 0.7  --     IMPRESSION:  Jill Ellison is a 81 yo woman with PMH of HTN, hyperlipidemia, a.fib, asthma, dementia, diastolic dysfunction, OA, OP s/p L.hip fx (12/2012), L.breast cancer s/p mastectomy (03/2010),   Iron def anemia, colon polyps. She was admitted 01/17/15 with n/v, shortness of  breath, fever. 02 sats reportedly 79% and CXR shows LUL pneumonia. Pt also in a.fib with RVR.   I spoke with pt. She is very pleasant, knows she's in the hospital. "I'm here for a check-up". She denies pain, shortness of breath or other symptoms.   I spoke with pt's granddaughter, Sharlyne Cai, by phone. Updated her on pt's current condition. Pt lives with Pamela's sister, Vilinda Blanks, and the 2 granddaughters are pt's shared decision-makers. They plan for pt to return to [facility]'s home at discharge.   I spoke with [name] about pt's code status. She says they want pt to be resuscitated but "didn't want to break ribs or anything like that". I explained that pt would be a risk for this with CPR. [name] says that she will discuss this with her sister. I offered to talk with sister if needed.    PLAN: 1. Home with granddaughter at discharge 2. Follow up with family re code status  More than 50% of the visit was spent in counseling/coordination of care: YES  Time spent: 70 minutes

## 2015-01-18 NOTE — Progress Notes (Addendum)
CRITICAL VALUE ALERT  Critical value received:  Blood cultures Positive in Anaerobic bottle and gram stain was gram positive for cocci in pairs  Date of notification:  01/18/2015  Time of notification:  11:57  Critical value read back:Yes.    Nurse who received alert:  Jule Ser RN  MD notified (1st page):  Dr. Volanda Napoleon  Time of first page:  12:00  MD notified (2nd page):  Time of second page:  Responding MD:  Dr. Volanda Napoleon  Time MD responded:  12:08

## 2015-01-18 NOTE — Progress Notes (Signed)
Pt has been asleep since arriving in CCU. No s/s of sepsis noted throughout shift. Will have MD re-eval in am.

## 2015-01-18 NOTE — Progress Notes (Signed)
[facility] [place] NAME: Jill Ellison    MR#:  [id number]  DATE OF BIRTH:  11-Feb-1917  SUBJECTIVE:  Feeling well this morning. No respiratory complaints.  REVIEW OF SYSTEMS:  CONSTITUTIONAL: No fever, fatigue or weakness.  EYES: No blurred or double vision.  EARS, NOSE, AND THROAT: No tinnitus or ear pain.  RESPIRATORY: No cough, shortness of breath, wheezing or hemoptysis.  CARDIOVASCULAR: No chest pain, orthopnea, edema.  GASTROINTESTINAL: No nausea, vomiting, diarrhea or abdominal pain.  GENITOURINARY: No dysuria, hematuria.  ENDOCRINE: No polyuria, nocturia,  HEMATOLOGY: No anemia, easy bruising or bleeding SKIN: No rash or lesion. MUSCULOSKELETAL: No joint pain or arthritis.   NEUROLOGIC: No tingling, numbness, weakness.  PSYCHIATRY: No anxiety or depression.   DRUG ALLERGIES:  No Known Allergies  VITALS:  Blood pressure 99/50, pulse 90, temperature 99.1 F (37.3 C), temperature source Oral, resp. rate 24, height 5\' 1"  (1.549 m), weight 65.318 kg (144 lb), SpO2 99 %.  PHYSICAL EXAMINATION:  GENERAL:  81 y.o.-year-old patient lying in the bed with no acute distress.  EYES: Pupils equal, round, reactive to light and accommodation. No scleral icterus. Extraocular muscles intact.  HEENT: Head atraumatic, normocephalic. Oropharynx and nasopharynx clear.  NECK:  Supple, no jugular venous distention. No thyroid enlargement, no tenderness.  LUNGS: No respiratory distress. Scattered wheezes and crackles.  CARDIOVASCULAR: S1, S2 normal. No murmurs, rubs, or gallops.  ABDOMEN: Soft, nontender, nondistended. Bowel sounds present. No organomegaly or mass.  EXTREMITIES: No pedal edema, cyanosis, or clubbing.  NEUROLOGIC: Cranial nerves II through XII are intact. Muscle strength 5/5 in all extremities. Sensation intact. Gait not checked.  PSYCHIATRIC: The patient is alert and oriented x 3.  SKIN: No obvious rash, lesion, or  ulcer.    LABORATORY PANEL:   CBC  Recent Labs Lab 01/18/15 0714  WBC 24.9*  HGB 9.2*  HCT 28.9*  PLT 196   ------------------------------------------------------------------------------------------------------------------  Chemistries   Recent Labs Lab 01/17/15 1850 01/18/15 0714  NA 136 138  K 3.9 3.7  CL 96* 101  CO2 30 28  GLUCOSE 110* 92  BUN 29* 33*  CREATININE 1.46* 1.66*  CALCIUM 8.3* 7.8*  AST 25  --   ALT 11*  --   ALKPHOS 47  --   BILITOT 0.7  --    ------------------------------------------------------------------------------------------------------------------  Cardiac Enzymes  Recent Labs Lab 01/17/15 1850  TROPONINI <0.03   ------------------------------------------------------------------------------------------------------------------  RADIOLOGY:  Dg Chest Portable 1 View  01/17/2015   CLINICAL DATA:  Vomiting.  EXAM: PORTABLE CHEST - 1 VIEW  COMPARISON:  09/16/2014  FINDINGS: Mild cardiac enlargement. There is calcified atherosclerotic disease involving the aortic arch. Left upper lobe airspace consolidation is new from previous exam. No pleural effusion or edema.  IMPRESSION: 1. Left upper lobe pneumonia. Followup PA and lateral chest X-ray is recommended in 3-4 weeks following trial of antibiotic therapy to ensure resolution and exclude underlying malignancy. 2. Cardiac enlargement and aortic atherosclerosis.   Electronically Signed   By: Kerby Moors M.D.   On: 01/17/2015 19:23    EKG:   Orders placed or performed during the hospital encounter of 01/17/15  . ED EKG  . ED EKG  . EKG 12-Lead  . EKG 12-Lead    ASSESSMENT AND PLAN:   81 year old Caucasian female history of diastolic congestive heart failure, chronic atrial fibrillation on warfarin for anticoagulation presenting with one-day duration nausea/vomiting as well as fever and shortness of breath.  1.Sepsis,  -  meeting septic criteria by heart rate, temperature, respiratory  rate, leukocytosis present on arrival.  Likely source community-acquired pneumonia, she does have gram-positive bacteremia as well Continue Zosyn and vancomycin and Levaquin Blood pressure stable, heart rate and temperature also stable. Okay to DC to telemetry.  2. Atrial fibrillation with rapid ventricular response:  Heart rate improving Continue hydration, Cardizem New warfarin monitor INR  3. Hypertension, essential: Continue lisinopril, metoprolol. 4. Chronic diastolic heart failure: Continue beta-blockade, lisinopril, hold diuretics for now 5.Venous thromboembolism prophylactic: Therapeutic anticoagulation     All the records are reviewed and case discussed with Care Management/Social Workerr. Management plans discussed with the patient, family and they are in agreement.  CODE STATUS: Full. Appreciate palliative care consultation  TOTAL TIME TAKING CARE OF THIS PATIENT: 35 minutes.    [name] M.D on 01/18/2015 at 5:07 PM  Between 7am to 6pm - Pager - [phone]  After 6pm go to [email or web address] - password [email or web address] [place] [facility]  Office  [phone]  CC: Primary care physician; Margarita Rana, MD

## 2015-01-19 LAB — CBC
HEMATOCRIT: 27 % — AB (ref 35.0–47.0)
HEMOGLOBIN: 8.8 g/dL — AB (ref 12.0–16.0)
MCH: 31.2 pg (ref 26.0–34.0)
MCHC: 32.5 g/dL (ref 32.0–36.0)
MCV: 96 fL (ref 80.0–100.0)
Platelets: 181 10*3/uL (ref 150–440)
RBC: 2.81 MIL/uL — ABNORMAL LOW (ref 3.80–5.20)
RDW: 14 % (ref 11.5–14.5)
WBC: 9.2 10*3/uL (ref 3.6–11.0)

## 2015-01-19 LAB — PROTIME-INR
INR: 1.75
Prothrombin Time: 20.6 seconds — ABNORMAL HIGH (ref 11.4–15.0)

## 2015-01-19 LAB — BASIC METABOLIC PANEL
Anion gap: 7 (ref 5–15)
BUN: 37 mg/dL — AB (ref 6–20)
CALCIUM: 8.4 mg/dL — AB (ref 8.9–10.3)
CO2: 28 mmol/L (ref 22–32)
Chloride: 105 mmol/L (ref 101–111)
Creatinine, Ser: 1.52 mg/dL — ABNORMAL HIGH (ref 0.44–1.00)
GFR calc Af Amer: 32 mL/min — ABNORMAL LOW (ref >60)
GFR, EST NON AFRICAN AMERICAN: 28 mL/min — AB (ref >60)
GLUCOSE: 92 mg/dL (ref 65–99)
Potassium: 3.5 mmol/L (ref 3.5–5.1)
SODIUM: 140 mmol/L (ref 135–145)

## 2015-01-19 NOTE — Progress Notes (Signed)
[name] [place] NAME: Jill Ellison    MR#:  [id number]  DATE OF BIRTH:  [birth year]  SUBJECTIVE:  Doing very well. Still on nasal cannula. No respiratory distress  REVIEW OF SYSTEMS:  CONSTITUTIONAL: No fever, fatigue or weakness.  EYES: No blurred or double vision.  EARS, NOSE, AND THROAT: No tinnitus or ear pain.  RESPIRATORY: No cough, shortness of breath, wheezing or hemoptysis.  CARDIOVASCULAR: No chest pain, orthopnea, edema.  GASTROINTESTINAL: No nausea, vomiting, diarrhea or abdominal pain.  GENITOURINARY: No dysuria, hematuria.  ENDOCRINE: No polyuria, nocturia,  HEMATOLOGY: No anemia, easy bruising or bleeding SKIN: No rash or lesion. MUSCULOSKELETAL: No joint pain or arthritis.   NEUROLOGIC: No tingling, numbness, weakness.  PSYCHIATRY: No anxiety or depression.   DRUG ALLERGIES:  No Known Allergies  VITALS:  Blood pressure 126/58, pulse 77, temperature 99.2 F (37.3 C), temperature source Oral, resp. rate 20, height _0  (1.549 m), weight 62.914 kg (138 lb 11.2 oz), SpO2 95 %.  PHYSICAL EXAMINATION:  GENERAL:  81 y.o.-year-old patient lying in the bed with no acute distress.  EYES: Pupils equal, round, reactive to light and accommodation. No scleral icterus. Extraocular muscles intact.  HEENT: Head atraumatic, normocephalic. Oropharynx and nasopharynx clear.  NECK:  Supple, no jugular venous distention. No thyroid enlargement, no tenderness.  LUNGS: No respiratory distress. Scattered wheezes and crackles. Good air movement CARDIOVASCULAR: S1, S2 normal. No murmurs, rubs, or gallops.  ABDOMEN: Soft, nontender, nondistended. Bowel sounds present. No organomegaly or mass.  EXTREMITIES: No pedal edema, cyanosis, or clubbing.  NEUROLOGIC: Cranial nerves II through XII are intact. Muscle strength 5/5 in all extremities. Sensation intact. Gait not checked.  PSYCHIATRIC: The patient is alert and oriented x 3.   SKIN: No obvious rash, lesion, or ulcer.    LABORATORY PANEL:   CBC  Recent Labs Lab 01/19/15 0413  WBC 9.2  HGB 8.8*  HCT 27.0*  PLT 181   ------------------------------------------------------------------------------------------------------------------  Chemistries   Recent Labs Lab 01/17/15 1850  01/19/15 0413  NA 136  < > 140  K 3.9  < > 3.5  CL 96*  < > 105  CO2 30  < > 28  GLUCOSE 110*  < > 92  BUN 29*  < > 37*  CREATININE 1.46*  < > 1.52*  CALCIUM 8.3*  < > 8.4*  AST 25  --   --   ALT 11*  --   --   ALKPHOS 47  --   --   BILITOT 0.7  --   --   < > = values in this interval not displayed. ------------------------------------------------------------------------------------------------------------------  Cardiac Enzymes  Recent Labs Lab 01/17/15 1850  TROPONINI <0.03   ------------------------------------------------------------------------------------------------------------------  RADIOLOGY:  Dg Chest Portable 1 View  01/17/2015   CLINICAL DATA:  Vomiting.  EXAM: PORTABLE CHEST - 1 VIEW  COMPARISON:  09/16/2014  FINDINGS: Mild cardiac enlargement. There is calcified atherosclerotic disease involving the aortic arch. Left upper lobe airspace consolidation is new from previous exam. No pleural effusion or edema.  IMPRESSION: 1. Left upper lobe pneumonia. Followup PA and lateral chest X-ray is recommended in 3-4 weeks following trial of antibiotic therapy to ensure resolution and exclude underlying malignancy. 2. Cardiac enlargement and aortic atherosclerosis.   Electronically Signed   By: Kerby Moors M.D.   On: 01/17/2015 19:23    EKG:   Orders placed or performed during the hospital encounter of 01/17/15  . ED EKG  .  ED EKG  . EKG 12-Lead  . EKG 12-Lead    ASSESSMENT AND PLAN:   81 year old Caucasian female history of diastolic congestive heart failure, chronic atrial fibrillation on warfarin for anticoagulation presenting with one-day duration  nausea/vomiting as well as fever and shortness of breath.  1.Sepsis,  - met septic criteria on admission by heart rate, temperature, respiratory rate, leukocytosis Strep pneumo bacteremia, sensitivities pending Continue IV Levaquin, okay to DC vancomycin and Zosyn Blood pressure stable, heart rate and temperature also stable. Leukocytosis markedly improved  2. Atrial fibrillation with rapid ventricular response:  Rate controlled on oral medication, Cardizem and metoprolol New warfarin monitor INR  3. Hypertension, essential: Continue lisinopril, metoprolol.  4. Chronic diastolic heart failure:  No exacerbation Continue beta-blockade, lisinopril, hold diuretics for now  5.Venous thromboembolism prophylactic: Therapeutic anticoagulation  #6 acute on chronic kidney disease stage III/4 Continue gentle hydration, continue to monitor electrolytes and renal function  #7 community-acquired left upper lobe pneumonia:  Blood cultures positive for Streptococcus pneumonia, continue Levaquin  #8 acute respiratory failure with hypoxia Attempt to wean from nasal cannula today  All the records are reviewed and case discussed with Care Management/Social Workerr. Management plans discussed with the patient, family and they are in agreement.  CODE STATUS: Full. Appreciate palliative care consultation  TOTAL TIME TAKING CARE OF THIS PATIENT: 35 minutes.    Jill Ellison M.D on 01/19/2015 at 1:01 PM  Between 7am to 6pm - Pager - [phone]  After 6pm go to [email or web address] - password [email or web address] [facility]  Office  [phone]  CC: Primary care physician; Margarita Rana, MD

## 2015-01-19 NOTE — Progress Notes (Signed)
Physical Therapy Evaluation Patient Details Name: Jill Ellison MRN: [id number] DOB: December 09, 1916 Today's Date: 01/19/2015   History of Present Illness  Pt is a pleasant 81 year old female who was admitted for sepsis and pnemonia. Pt with complaints of fever and SOB and is now on 2.5L of O2.  Clinical Impression  Pt is a pleasant 81 year old female who was admitted for sepsis and pneumonia. Pt performs bed mobility (min assist), transfers (mod assist), and ambulation with rw and mod assist. Pt now requiring O2 for all mobility with desat noted to 83% with exertion. Pt then able to perform pursed lip breathing to improve sats to 91%. RN aware. Pt demonstrates deficits with strength/endurance. Pt is not at baseline level at this time. Would benefit from skilled PT to address above deficits and promote optimal return to PLOF, recommend transition to STR upon discharge from acute hospitalization, however family refusing at this time.      Follow Up Recommendations SNF (however family refusing; they want to take her home)    Equipment Recommendations  Rolling walker with 5" wheels;Wheelchair (measurements PT)    Recommendations for Other Services       Precautions / Restrictions Precautions Precautions: Fall Restrictions Weight Bearing Restrictions: No      Mobility  Bed Mobility Overal bed mobility: Needs Assistance             General bed mobility comments: supine->sit with min assist. Once seated at EOB, pt able to sit with supervision  Transfers Overall transfer level: Needs assistance               General transfer comment: sit<>Stand with rw and mod assist. Heavy post leaning noted with standing, however able to self correct with tactile cues. RW used  Ambulation/Gait Ambulation/Gait assistance: Mod assist Ambulation Distance (Feet): 20 Feet Assistive device: Rolling walker (2 wheeled)       General Gait Details: ambulated to [facility], then to recliner. Pt  fatigues quickly with B LEs buckling requiring mod assist to complete distance. O2 sats decreased to 83% while on O2. RN aware. Slow step to gait pattern performed with heavy cues given for sequencing  Stairs            Wheelchair Mobility    Modified Rankin (Stroke Patients Only)       Balance Overall balance assessment: Needs assistance Sitting-balance support: Bilateral upper extremity supported Sitting balance-Leahy Scale: Fair       Standing balance-Leahy Scale: Poor                               Pertinent Vitals/Pain Pain Assessment: 0-10    Home Living Family/patient expects to be discharged to:: Private residence Living Arrangements: Children Available Help at Discharge: Family;Available 24 hours/day Type of Home: House Home Access: Stairs to enter Entrance Stairs-Rails: None Entrance Stairs-Number of Steps: 4 Home Layout: One level        Prior Function Level of Independence: Independent               Hand Dominance        Extremity/Trunk Assessment   Upper Extremity Assessment: Overall WFL for tasks assessed           Lower Extremity Assessment: Generalized weakness         Communication   Communication: No difficulties  Cognition Arousal/Alertness: Awake/alert Behavior During Therapy: WFL for tasks assessed/performed Overall Cognitive Status: Within Functional  Limits for tasks assessed                      General Comments      Exercises Other Exercises Other Exercises: Pt performed tolieting on BSC with min assist for sit<>Stand multiple times to assist in hygiene. Pt with posterior LOB noted. RW used (10 min)      Assessment/Plan    PT Assessment Patient needs continued PT services  PT Diagnosis Difficulty walking;Generalized weakness   PT Problem List Decreased strength;Decreased balance;Decreased safety awareness  PT Treatment Interventions DME instruction;Gait training;Therapeutic  exercise;Balance training   PT Goals (Current goals can be found in the Care Plan section) Acute Rehab PT Goals Patient Stated Goal: to get stronger PT Goal Formulation: With patient Time For Goal Achievement: 02/02/15 Potential to Achieve Goals: Good Additional Goals Additional Goal #1: Pt will be able to perform all mobility while with cga and rw while maintaining O2 sats WNL in order to safely navigate home environment. Additional Goal #2: Pt will be able to perform 10-15 reps of B LE ther-ex with cga in order to improve strength and reduce fall risk    Frequency Min 2X/week   Barriers to discharge        Co-evaluation               End of Session Equipment Utilized During Treatment: Gait belt;Oxygen Activity Tolerance: Patient limited by fatigue Patient left: in chair;with chair alarm set Nurse Communication: Mobility status         Time: 0131-4388 PT Time Calculation (min) (ACUTE ONLY): 29 min   Charges:   PT Evaluation $Initial PT Evaluation Tier I: 1 Procedure PT Treatments $Therapeutic Activity: 8-22 mins   PT G Codes:       Greggory Stallion, PT, DPT [phone]  Letta Cargile 01/19/2015, 5:01 PM

## 2015-01-19 NOTE — Clinical Social Work Note (Signed)
CSW was notified by PT that their recommendations are for SNF.  CSW will assess pt for SNF placement tomorrow.

## 2015-01-20 LAB — PROTIME-INR
INR: 1.4
Prothrombin Time: 17.4 seconds — ABNORMAL HIGH (ref 11.4–15.0)

## 2015-01-20 MED ORDER — LEVOFLOXACIN IN D5W 500 MG/100ML IV SOLN
500.0000 mg | INTRAVENOUS | Status: DC
  Administered 2015-01-21: 500 mg via INTRAVENOUS
  Filled 2015-01-20: qty 100

## 2015-01-20 NOTE — Progress Notes (Signed)
[name] NAME: Jill Ellison    MR#:  [id number]  DATE OF BIRTH:  1916-10-16  SUBJECTIVE:  Feels weak. Still on nasal cannula. No respiratory distress  REVIEW OF SYSTEMS:  CONSTITUTIONAL: No fever, fatigue or weakness.  EYES: No blurred or double vision.  EARS, NOSE, AND THROAT: No tinnitus or ear pain.  RESPIRATORY: short of breath with exertion CARDIOVASCULAR: No chest pain, orthopnea, edema.  GASTROINTESTINAL: No nausea, vomiting, diarrhea or abdominal pain.  GENITOURINARY: No dysuria, hematuria.  ENDOCRINE: No polyuria, nocturia,  HEMATOLOGY: No anemia, easy bruising or bleeding SKIN: No rash or lesion. MUSCULOSKELETAL: No joint pain or arthritis.   NEUROLOGIC: No tingling, numbness, weakness.  PSYCHIATRY: No anxiety or depression.   DRUG ALLERGIES:  No Known Allergies  VITALS:  Blood pressure 134/81, pulse 110, temperature 98.4 F (36.9 C), temperature source Oral, resp. rate 18, height 5' 1"  (1.549 m), weight 56.427 kg (124 lb 6.4 oz), SpO2 95 %.  PHYSICAL EXAMINATION:  GENERAL:  81 y.o.-year-old patient lying in the bed with no acute distress.  EYES: Pupils equal, round, reactive to light and accommodation. No scleral icterus. Extraocular muscles intact.  HEENT: Head atraumatic, normocephalic. Oropharynx and nasopharynx clear.  NECK:  Supple, no jugular venous distention. No thyroid enlargement, no tenderness.  LUNGS: No respiratory distress. Scattered wheezes and crackles. Good air movement CARDIOVASCULAR: S1, S2 normal. No murmurs, rubs, or gallops.  ABDOMEN: Soft, nontender, nondistended. Bowel sounds present. No organomegaly or mass.  EXTREMITIES: No pedal edema, cyanosis, or clubbing.  NEUROLOGIC: Cranial nerves II through XII are intact. Muscle strength 5/5 in all extremities. Sensation intact. Gait not checked.  PSYCHIATRIC: The patient is alert and oriented x 3.  SKIN: No obvious rash, lesion, or  ulcer.    LABORATORY PANEL:   CBC  Recent Labs Lab 01/19/15 0413  WBC 9.2  HGB 8.8*  HCT 27.0*  PLT 181   ------------------------------------------------------------------------------------------------------------------  Chemistries   Recent Labs Lab 01/17/15 1850  01/19/15 0413  NA 136  < > 140  K 3.9  < > 3.5  CL 96*  < > 105  CO2 30  < > 28  GLUCOSE 110*  < > 92  BUN 29*  < > 37*  CREATININE 1.46*  < > 1.52*  CALCIUM 8.3*  < > 8.4*  AST 25  --   --   ALT 11*  --   --   ALKPHOS 47  --   --   BILITOT 0.7  --   --   < > = values in this interval not displayed. ------------------------------------------------------------------------------------------------------------------  Cardiac Enzymes  Recent Labs Lab 01/17/15 1850  TROPONINI <0.03   ------------------------------------------------------------------------------------------------------------------  RADIOLOGY:  No results found.  EKG:   Orders placed or performed during the hospital encounter of 01/17/15  . ED EKG  . ED EKG  . EKG 12-Lead  . EKG 12-Lead    ASSESSMENT AND PLAN:   81 year old Caucasian female history of diastolic congestive heart failure, chronic atrial fibrillation on warfarin for anticoagulation presenting with one-day duration nausea/vomiting as well as fever and shortness of breath.  1.Sepsis,  - met septic criteria on admission by heart rate, temperature, respiratory rate, leukocytosis Strep pneumo bacteremia, sensitivities pending Continue IV Levaquin,  Blood pressure stable, heart rate and temperature also stable. Leukocytosis markedly improved Will await sensitivities before discharging. Recheck blood cultures before discharge to ensure clearance.  2. Atrial fibrillation with rapid ventricular response:  Rate  controlled on oral medication, Cardizem and metoprolol New warfarin monitor INR  3. Hypertension, essential: Continue lisinopril, metoprolol.  4. Chronic  diastolic heart failure:  No exacerbation Continue beta-blockade, lisinopril, hold diuretics for now  5.Venous thromboembolism prophylactic: Therapeutic anticoagulation  #6 acute on chronic kidney disease stage III/4 Continue gentle hydration, continue to monitor electrolytes and renal function  #7 community-acquired left upper lobe pneumonia:  Blood cultures positive for Streptococcus pneumonia, continue Levaquin  #8 acute respiratory failure with hypoxia Attempt to wean from nasal cannula again today today  #9 disposition. PT has recommended SNF. Patient and family did not want this option yesterday, but today she seems weaker and is considering rehab.  All the records are reviewed and case discussed with Care Management/Social Workerr. Management plans discussed with the patient, family and they are in agreement.  CODE STATUS: Full. Appreciate palliative care consultation  TOTAL TIME TAKING CARE OF THIS PATIENT: 35 minutes.    [name] M.D on 01/20/2015 at 4:39 PM  Between 7am to 6pm - Pager - [phone]  After 6pm go to [email or web address] - password [email or web address] [facility]  Office  [phone]  CC: Primary care physician; Margarita Rana, MD

## 2015-01-20 NOTE — Progress Notes (Signed)
PT Cancellation Note  Patient Details Name: Jill Ellison MRN: [id number] DOB: 1917-03-09   Cancelled Treatment:    Reason Eval/Treat Not Completed: Medical issues which prohibited therapy (see PT cancellation note for further details). Pt with increased HR (110 bpm) and per RN currently refusing therapy at this time. Will hold therapy until next available date. Greggory Stallion, PT, DPT [phone]    [name] 01/20/2015, 4:08 PM

## 2015-01-20 NOTE — Care Management (Signed)
There is discussion during progression that patient will discharge home today.  CM has left voicemail messages with Sharlyne Cai and awaiting a call back to discuss home health, physical care needs,  Need for DME.  Awaiting response.  Palliative care has consulted and patient at this time remains a full code.  Family has declined skilled nursing due to a previous skilled nursing stay that was not pleasant.  It is very likely patient is going to require home 02.  Discussed obtaining qualifying sats during progression.

## 2015-01-20 NOTE — Progress Notes (Signed)
ANTIBIOTIC CONSULT NOTE - FOLLOW UP  Pharmacy Consult for Levaquin Indication: rule out pneumonia   No Known Allergies  Patient Measurements: Height: 5\' 1"  (154.9 cm) Weight: 124 lb 6.4 oz (56.427 kg) IBW/kg (Calculated) : 47.8 Adjusted Body Weight: 50 kg  Vital Signs: Temp: 98.4 F (36.9 C) (05/12 1129) Temp Source: Oral (05/12 1129) BP: 134/81 mmHg (05/12 1129) Pulse Rate: 110 (05/12 1129) Intake/Output from previous day: 05/11 0701 - 05/12 0700 In: 0  Out: 1080 [Urine:1080] Intake/Output from this shift: Total I/O In: -  Out: 1000 [Urine:1000]  Labs:  Recent Labs  01/17/15 1850 01/18/15 0714 01/19/15 0413  WBC 27.0* 24.9* 9.2  HGB 10.7* 9.2* 8.8*  PLT 247 196 181  CREATININE 1.46* 1.66* 1.52*   Estimated Creatinine Clearance: 16.3 mL/min (by C-G formula based on Cr of 1.52). No results for input(s): VANCOTROUGH, VANCOPEAK, VANCORANDOM, GENTTROUGH, GENTPEAK, GENTRANDOM, TOBRATROUGH, TOBRAPEAK, TOBRARND, AMIKACINPEAK, AMIKACINTROU, AMIKACIN in the last 72 hours.   Microbiology: Recent Results (from the past 720 hour(s))  Culture, blood (routine x 2)     Status: None (Preliminary result)   Collection Time: 01/17/15  6:50 PM  Result Value Ref Range Status   Specimen Description BLOOD  Final   Special Requests Normal  Final   Culture  Setup Time GRAM POSITIVE COCCI IN PAIRS ANAEROBIC BOTTLE ONLY  Final   Culture   Final    STREPTOCOCCUS PNEUMONIAE BLOOD ANAEROBIC BOTTLE SUSCEPTIBILITIES TO FOLLOW CRITICAL RESULT CALLED TO, READ BACK BY AND VERIFIED WITH: CTJ TO PAMELA ALLEN AT 1155 01/18/15    Report Status PENDING  Incomplete  Culture, blood (routine x 2)     Status: None (Preliminary result)   Collection Time: 01/17/15  6:50 PM  Result Value Ref Range Status   Specimen Description BLOOD  Final   Special Requests Normal  Final   Culture NO GROWTH 3 DAYS  Final   Report Status PENDING  Incomplete    Anti-infectives    Start     Dose/Rate Route  Frequency Ordered Stop   01/21/15 1000  levofloxacin (LEVAQUIN) IVPB 500 mg     500 mg 100 mL/hr over 60 Minutes Intravenous Every 48 hours 01/20/15 1530     01/19/15 1800  levofloxacin (LEVAQUIN) IVPB 750 mg  Status:  Discontinued     750 mg 100 mL/hr over 90 Minutes Intravenous Every 48 hours 01/17/15 2249 01/20/15 1530   01/19/15 0200  vancomycin (VANCOCIN) IVPB 1000 mg/200 mL premix  Status:  Discontinued     1,000 mg 200 mL/hr over 60 Minutes Intravenous Every 36 hours 01/18/15 1259 01/18/15 1300   01/18/15 1400  piperacillin-tazobactam (ZOSYN) IVPB 3.375 g  Status:  Discontinued     3.375 g 12.5 mL/hr over 240 Minutes Intravenous 3 times per day 01/18/15 1259 01/19/15 1307   01/18/15 1400  vancomycin (VANCOCIN) IVPB 1000 mg/200 mL premix  Status:  Discontinued     1,000 mg 200 mL/hr over 60 Minutes Intravenous Every 36 hours 01/18/15 1300 01/19/15 1307   01/18/15 1330  vancomycin (VANCOCIN) IVPB 750 mg/150 ml premix  Status:  Discontinued     750 mg 150 mL/hr over 60 Minutes Intravenous  Once 01/18/15 1259 01/18/15 1300   01/17/15 1945  levofloxacin (LEVAQUIN) IVPB 750 mg     750 mg 100 mL/hr over 90 Minutes Intravenous  Once 01/17/15 1940 01/17/15 2155   01/17/15 1900  vancomycin (VANCOCIN) IVPB 1000 mg/200 mL premix     1,000 mg 200 mL/hr over 60  Minutes Intravenous  Once 01/17/15 1851 01/18/15 0140   01/17/15 1900  piperacillin-tazobactam (ZOSYN) IVPB 3.375 g     3.375 g 12.5 mL/hr over 240 Minutes Intravenous  Once 01/17/15 1851 01/17/15 1930      Assessment: Patient estimated CrCL ~17 ml/min   Goal of Therapy:    Plan:  Will change to Levofloxacin 500 mg IV q48 hours for due to renal impairment.   Jaimya Feliciano D 01/20/2015,3:30 PM

## 2015-01-20 NOTE — Care Management (Signed)
Wheel Chair Necessity   Patient suffers from CHF, dyspnea, dementia and lumbago which impairs her ability to perform daily activities such as walking (at present is not able to stand on her own) in the home. A walker will not will not resolve issues completely depending on the functional and mental status of patient.  A wheelchair will allow patient to safely perform daily activities. Patient can safely propel the wheelchair in the home or has a caregiver who can provide assistance.  Spoke with patient's granddaughterOlin Hauser.  She states that it is planned for patient to return home. She says that there will be round the clock caregiver support in the home.  Agency preference for home health services is [facility].  Contacted for referral and agency is checking to see if policy is "a dual policy."  [employer] does not have [facility] in network with dual medicare/medicaid product.  Discussed with primary nurse the need to qualify for home 02 even though patient is not going to discharge home today.  [facility] contacted regarding the wheelchair and walker.  Left  Sticky note for  Attending for the needed orders.

## 2015-01-21 ENCOUNTER — Inpatient Hospital Stay: Payer: Medicare Other

## 2015-01-21 LAB — CREATININE, SERUM

## 2015-01-21 LAB — BASIC METABOLIC PANEL
Anion gap: 8 (ref 5–15)
BUN: 26 mg/dL — ABNORMAL HIGH (ref 6–20)
CALCIUM: 8.5 mg/dL — AB (ref 8.9–10.3)
CO2: 27 mmol/L (ref 22–32)
Chloride: 107 mmol/L (ref 101–111)
Creatinine, Ser: 1.13 mg/dL — ABNORMAL HIGH (ref 0.44–1.00)
GFR calc Af Amer: 46 mL/min — ABNORMAL LOW (ref >60)
GFR calc non Af Amer: 40 mL/min — ABNORMAL LOW (ref >60)
GLUCOSE: 101 mg/dL — AB (ref 65–99)
POTASSIUM: 3.2 mmol/L — AB (ref 3.5–5.1)
Sodium: 142 mmol/L (ref 135–145)

## 2015-01-21 LAB — CBC
HCT: 26.6 % — ABNORMAL LOW (ref 35.0–47.0)
HEMOGLOBIN: 8.5 g/dL — AB (ref 12.0–16.0)
MCH: 31.2 pg (ref 26.0–34.0)
MCHC: 32.1 g/dL (ref 32.0–36.0)
MCV: 97.5 fL (ref 80.0–100.0)
Platelets: 186 10*3/uL (ref 150–440)
RBC: 2.72 MIL/uL — AB (ref 3.80–5.20)
RDW: 13.9 % (ref 11.5–14.5)
WBC: 7.1 10*3/uL (ref 3.6–11.0)

## 2015-01-21 LAB — PROTIME-INR
INR: 1.55
PROTHROMBIN TIME: 18.8 s — AB (ref 11.4–15.0)

## 2015-01-21 LAB — C DIFFICILE QUICK SCREEN W PCR REFLEX
C Diff antigen: POSITIVE
C Diff toxin: NEGATIVE

## 2015-01-21 LAB — PLATELET COUNT

## 2015-01-21 LAB — CLOSTRIDIUM DIFFICILE BY PCR: CDIFFPCR: NEGATIVE

## 2015-01-21 MED ORDER — PIPERACILLIN-TAZOBACTAM 3.375 G IVPB
3.3750 g | Freq: Three times a day (TID) | INTRAVENOUS | Status: DC
  Administered 2015-01-21 – 2015-01-22 (×3): 3.375 g via INTRAVENOUS
  Filled 2015-01-21 (×7): qty 50

## 2015-01-21 MED ORDER — VANCOMYCIN HCL IN DEXTROSE 1-5 GM/200ML-% IV SOLN
1000.0000 mg | Freq: Once | INTRAVENOUS | Status: AC
  Administered 2015-01-21: 1000 mg via INTRAVENOUS
  Filled 2015-01-21: qty 200

## 2015-01-21 MED ORDER — WARFARIN SODIUM 2.5 MG PO TABS
5.0000 mg | ORAL_TABLET | Freq: Once | ORAL | Status: AC
  Administered 2015-01-21: 5 mg via ORAL
  Filled 2015-01-21: qty 2

## 2015-01-21 MED ORDER — WARFARIN SODIUM 1 MG PO TABS
4.0000 mg | ORAL_TABLET | Freq: Every day | ORAL | Status: DC
  Administered 2015-01-22: 4 mg via ORAL
  Filled 2015-01-21: qty 4

## 2015-01-21 MED ORDER — LEVOFLOXACIN 750 MG PO TABS: 750.0000 mg | ORAL_TABLET | ORAL | Status: DC

## 2015-01-21 MED ORDER — POTASSIUM CHLORIDE CRYS ER 20 MEQ PO TBCR
40.0000 meq | EXTENDED_RELEASE_TABLET | Freq: Once | ORAL | Status: AC
  Administered 2015-01-21: 40 meq via ORAL
  Filled 2015-01-21: qty 2

## 2015-01-21 MED ORDER — IPRATROPIUM-ALBUTEROL 0.5-2.5 (3) MG/3ML IN SOLN
3.0000 mL | RESPIRATORY_TRACT | Status: DC
  Administered 2015-01-21 – 2015-01-22 (×9): 3 mL via RESPIRATORY_TRACT
  Filled 2015-01-21 (×9): qty 3

## 2015-01-21 MED ORDER — VANCOMYCIN HCL IN DEXTROSE 750-5 MG/150ML-% IV SOLN
750.0000 mg | INTRAVENOUS | Status: DC
  Filled 2015-01-21: qty 150

## 2015-01-21 NOTE — Progress Notes (Signed)
RD Assessment  Admitted with: vomiting/ sepsis PMHx:  Past Medical History  Diagnosis Date  . Swelling of ankle   . SOB (shortness of breath)   . Hypertension   . Asthma   . Arthritis   . Atrial fibrillation 2011  . Cancer 2011    Left breast  . Cancer of leg   . Malignant neoplasm of upper-outer quadrant of female breast     left   . Memory loss   . Dementia   . GERD (gastroesophageal reflux disease)   . Hyperlipidemia   . Anemia   . Iron deficiency   . History of colonic polyps   . Lumbago     Current Diet: Heart Healthy Typical Food/ Fluid Intake: Poor PO intake per I/O Meal/ Snack Patterns: Patient reports eating a regular diet PTA. She reports being a "snacker" vs. Someone who sits down to "full meals". Diet recall difficult due to patient mental status. Pt denies difficulty chewing/ swallowing.   Supplements: Unable to assess  Food Allergies: NKFA Food Preferences: Reviewed  Ht: 61" Current weight: 126# BMI: 27.3 Weight Changes: Pt unable to recall recent weight history. Reviewed previous medical records from 6 months ago. Pt weight in Dec 2015 was 116#. Current weight represents a 10# weight gain x 6 months.  UOP:   Intake/Output Summary (Last 24 hours) at 01/21/15 1608 Last data filed at 01/21/15 1444  Gross per 24 hour  Intake 3656.25 ml  Output    100 ml  Net 3556.25 ml    Digestive: Last BM 5/12 Gastrointestinal: Reviewed Skin: No concerns.  Physical Findings: n/a  Labs: Electrolyte and Renal Profile:    Recent Labs Lab 01/18/15 0714 01/19/15 0413 01/21/15 0511  BUN 33* 37* 26*  CREATININE 1.66* 1.61* 0.96*  DUPLICATE REQUEST  NA 045 140 142  K 3.7 3.5 3.2*   Protein Profile:  Recent Labs Lab 01/17/15 1850  ALBUMIN 3.1*   Meds: Vit D, MVI  PES Statement: Inadequate energy intake related to acute illness as evidenced by poor PO intake during hospitalization.  Intervention:  Meals and Snacks: Cater to patient  preferences Medical Nutrition Supplement: Bebe Shaggy with meals for additional nutrition.  Monitoring/ Evaluation: Energy Intake: goal for patient to meet >90% of estimated needs. Electrolyte and Renal Profile UOP Digestive Profile  Roda Shutters, RDN Pager: [phone] Office: [place]

## 2015-01-21 NOTE — Progress Notes (Signed)
ANTICOAGULATION CONSULT NOTE - Initial Consult   Pharmacy Consult for Coumadin Indication: atrial fibrillation  No Known Allergies  Patient Measurements: Height: 5\' 1"  (154.9 cm) Weight: 126 lb 3.2 oz (57.244 kg) IBW/kg (Calculated) : 47.8  Vital Signs: Temp: 97.8 F (36.6 C) (05/13 1126) Temp Source: Oral (05/13 1126) BP: 110/62 mmHg (05/13 1126) Pulse Rate: 40 (05/13 1126)  Labs:  Recent Labs  01/19/15 0413 01/20/15 0439 01/21/15 0511  HGB 8.8*  --  8.5*  HCT 27.0*  --  26.6*  PLT 785  --  885  DUPLICATE REQUEST  LABPROT 20.6* 17.4* 18.8*  INR 1.75 1.40 1.55  CREATININE 1.52*  --  0.27*  DUPLICATE REQUEST    Medical History: Past Medical History  Diagnosis Date  . Swelling of ankle   . SOB (shortness of breath)   . Hypertension   . Asthma   . Arthritis   . Atrial fibrillation 2011  . Cancer 2011    Left breast  . Cancer of leg   . Malignant neoplasm of upper-outer quadrant of female breast     left   . Memory loss   . Dementia   . GERD (gastroesophageal reflux disease)   . Hyperlipidemia   . Anemia   . Iron deficiency   . History of colonic polyps   . Lumbago     Medications:  Prescriptions prior to admission  Medication Sig Dispense Refill Last Dose  . calcium-vitamin D (OSCAL WITH D) 500-200 MG-UNIT per tablet Take 1 tablet by mouth 2 (two) times daily.     Past Month at Unknown time  . Cholecalciferol (VITAMIN D3) 1000 UNITS CAPS Take 1 capsule by mouth daily.     Past Month at Unknown time  . diltiazem (CARDIZEM CD) 120 MG 24 hr capsule Take 1 capsule (120 mg total) by mouth daily. 30 capsule 11 01/16/2015 at Unknown time  . furosemide (LASIX) 20 MG tablet TAKE 1 TABLET EVERY DAY 30 tablet 6 01/16/2015 at Unknown time  . KLOR-CON M20 20 MEQ tablet TAKE 1 TABLET BY MOUTH EVERY DAY 30 tablet 3 01/16/2015 at Unknown time  . letrozole (FEMARA) 2.5 MG tablet Take 2.5 mg by mouth daily.     01/16/2015 at Unknown time  . lisinopril (PRINIVIL,ZESTRIL) 20 MG  tablet TAKE 1 TABLET BY MOUTH DAILY 30 tablet 6 01/16/2015 at Unknown time  . Magnesium 250 MG TABS Take 2 tablets by mouth daily.    Past Month at Unknown time  . metoprolol succinate (TOPROL-XL) 25 MG 24 hr tablet Take 25 mg by mouth daily.   01/16/2015 at 1700  . Multiple Vitamin (MULTIVITAMIN) tablet Take 1 tablet by mouth daily.     Past Month at Unknown time  . warfarin (COUMADIN) 5 MG tablet Take 5 mg by mouth daily.   01/16/2015 at Unknown time  . fexofenadine (ALLEGRA) 180 MG tablet Take 180 mg by mouth daily as needed for allergies.    prn  . levalbuterol (XOPENEX HFA) 45 MCG/ACT inhaler Inhale 1-2 puffs into the lungs every 4 (four) hours as needed.     rescue  . PROAIR HFA 108 (90 BASE) MCG/ACT inhaler Inhale 2 puffs into the lungs every 6 (six) hours as needed for wheezing or shortness of breath.   2 rescue  . warfarin (COUMADIN) 2 MG tablet TAKE AS DIRECTED BY ANTICOAGULATION CLINIC (Patient not taking: Reported on 01/17/2015) 75 tablet 3 Taking   Scheduled:  . calcium-vitamin D  1 tablet Oral BID  .  cholecalciferol  1,000 Units Oral Daily  . diltiazem  120 mg Oral Daily  . heparin  5,000 Units Subcutaneous 3 times per day  . ipratropium-albuterol  3 mL Nebulization Q4H  . letrozole  2.5 mg Oral Daily  . [START ON 01/23/2015] levofloxacin  750 mg Oral Q48H  . lisinopril  20 mg Oral Daily  . loratadine  10 mg Oral Daily  . magnesium oxide  800 mg Oral Daily  . metoprolol succinate  25 mg Oral Daily  . multivitamin with minerals  1 tablet Oral Daily  . piperacillin-tazobactam (ZOSYN)  IV  3.375 g Intravenous 3 times per day  . vancomycin  1,000 mg Intravenous Once  . [START ON 01/22/2015] warfarin  4 mg Oral q1800  . warfarin  5 mg Oral ONCE-1800   Infusions:  . sodium chloride 75 mL/hr at 01/21/15 0509   PRN: acetaminophen **OR** acetaminophen, albuterol, ondansetron **OR** ondansetron (ZOFRAN) IV Anti-infectives    Start     Dose/Rate Route Frequency Ordered Stop   01/23/15 0900   levofloxacin (LEVAQUIN) tablet 750 mg     750 mg Oral Every 48 hours 01/21/15 1028     01/21/15 1400  piperacillin-tazobactam (ZOSYN) IVPB 3.375 g     3.375 g 12.5 mL/hr over 240 Minutes Intravenous 3 times per day 01/21/15 1152     01/21/15 1300  vancomycin (VANCOCIN) IVPB 1000 mg/200 mL premix     1,000 mg 200 mL/hr over 60 Minutes Intravenous  Once 01/21/15 1152     01/21/15 1000  levofloxacin (LEVAQUIN) IVPB 500 mg  Status:  Discontinued     500 mg 100 mL/hr over 60 Minutes Intravenous Every 48 hours 01/20/15 1530 01/21/15 1028   01/19/15 1800  levofloxacin (LEVAQUIN) IVPB 750 mg  Status:  Discontinued     750 mg 100 mL/hr over 90 Minutes Intravenous Every 48 hours 01/17/15 2249 01/20/15 1530   01/19/15 0200  vancomycin (VANCOCIN) IVPB 1000 mg/200 mL premix  Status:  Discontinued     1,000 mg 200 mL/hr over 60 Minutes Intravenous Every 36 hours 01/18/15 1259 01/18/15 1300   01/18/15 1400  piperacillin-tazobactam (ZOSYN) IVPB 3.375 g  Status:  Discontinued     3.375 g 12.5 mL/hr over 240 Minutes Intravenous 3 times per day 01/18/15 1259 01/19/15 1307   01/18/15 1400  vancomycin (VANCOCIN) IVPB 1000 mg/200 mL premix  Status:  Discontinued     1,000 mg 200 mL/hr over 60 Minutes Intravenous Every 36 hours 01/18/15 1300 01/19/15 1307   01/18/15 1330  vancomycin (VANCOCIN) IVPB 750 mg/150 ml premix  Status:  Discontinued     750 mg 150 mL/hr over 60 Minutes Intravenous  Once 01/18/15 1259 01/18/15 1300   01/17/15 1945  levofloxacin (LEVAQUIN) IVPB 750 mg     750 mg 100 mL/hr over 90 Minutes Intravenous  Once 01/17/15 1940 01/17/15 2155   01/17/15 1900  vancomycin (VANCOCIN) IVPB 1000 mg/200 mL premix     1,000 mg 200 mL/hr over 60 Minutes Intravenous  Once 01/17/15 1851 01/18/15 0140   01/17/15 1900  piperacillin-tazobactam (ZOSYN) IVPB 3.375 g     3.375 g 12.5 mL/hr over 240 Minutes Intravenous  Once 01/17/15 1851 01/17/15 1930      Assessment: Patient on coumadin for afib as  an outpatient, most recently taking 5mg  daily. INR on admission: 1.75. Has been on coumadin 2mg  daily since admission. Current INR: 1.55. Patient also on subq heparin at this time.   Goal of Therapy:  INR  2-3   Plan:  Will give coumadin 5mg  x 1 today and follow with 4mg  daily. Dose reduced from home dose due to concurrent antimicrobial treatment. Plan to check INR daily and d/c subq heparin when INR therapeutic.   Rexene Edison, PharmD  01/21/2015,1:51 PM

## 2015-01-21 NOTE — Progress Notes (Signed)
Physical Therapy Treatment Patient Details Name: Jill Ellison MRN: [id number] DOB: [birth year] Today's Date: 01/21/2015    History of Present Illness Pt is a pleasant 81 year old female who was admitted for sepsis and pnemonia. Pt with complaints of fever and SOB and is now on 2.5L of O2.    PT Comments    Pt is making limited progress towards goals and is limited secondary to fatigue and decreased O2 sats. Pt on 2L of O2 at this time with desat to 79% with minimal exertion with increased time to return to 90%. Pt reports increased SOB with exertion. RN notified of therapist concerns for safety at home. Still highly recommend SNF after hospital stay. Unable to progress mobility at this time.  Follow Up Recommendations  SNF     Equipment Recommendations  Rolling walker with 5" wheels;Wheelchair (measurements PT)    Recommendations for Other Services       Precautions / Restrictions Precautions Precautions: Fall Restrictions Weight Bearing Restrictions: No    Mobility  Bed Mobility               General bed mobility comments: not performed as pt in recliner upon arrival  Transfers                 General transfer comment: not performed secondary to O2 sats  Ambulation/Gait             General Gait Details: not performed secondary to O2 sats   Stairs            Wheelchair Mobility    Modified Rankin (Stroke Patients Only)       Balance                                    Cognition Arousal/Alertness: Awake/alert Behavior During Therapy: WFL for tasks assessed/performed Overall Cognitive Status: Within Functional Limits for tasks assessed                      Exercises Other Exercises Other Exercises: Pt performed seated ther-ex including B ankle pumps, quad sets, hip add squeezes, SLRs, and hip abd/add x 12 reps with cga and cues for sequencing. Pt requires 1  rest break secondary to fatigue.    General  Comments        Pertinent Vitals/Pain Pain Assessment: No/denies pain    Home Living                      Prior Function            PT Goals (current goals can now be found in the care plan section) Acute Rehab PT Goals Patient Stated Goal: to get stronger PT Goal Formulation: With patient Time For Goal Achievement: 02/02/15 Potential to Achieve Goals: Good    Frequency  Min 2X/week    PT Plan      Co-evaluation             End of Session Equipment Utilized During Treatment: Gait belt;Oxygen Activity Tolerance: Patient limited by fatigue Patient left: in chair;with chair alarm set     Time: 1324-4010 PT Time Calculation (min) (ACUTE ONLY): 11 min  Charges:  $Therapeutic Exercise: 8-22 mins                    G Codes:     Jill Ellison, PT, DPT [phone]  Jill Ellison 01/21/2015, 1:59 PM

## 2015-01-21 NOTE — Care Management Note (Signed)
Case Management Note  Patient Details  Name: Jill Ellison MRN: [id number] Date of Birth: [birth year]  Subjective/Objective:      Updated granddaughter Arlene who provides direct care to patient at home that CM continues to anticipate the need for a wheelchair, walker and home 02.  Attending has not provided the scripts but attending says that patient will not discharge home over the weekend.  Also relayed reason that could not refer to [facility] for the home health services.  Verbalized agreement with [facility] providing the services.               Action/Plan:   Expected Discharge Date:                  Expected Discharge Plan:     In-House Referral:     Discharge planning Services     Post Acute Care Choice:    Choice offered to:     DME Arranged:    DME Agency:  [place]:  yes [place] [facility]:   [facility]  Status of Service:     Medicare Important Message Given:  Yes Date Medicare IM Given:  01/20/15 Medicare IM give by:  Joni Reining Date Additional Medicare IM Given:    Additional Medicare Important Message give by:     If discussed at [place] of Stay Meetings, dates discussed:    Additional Comments:  Katrina Stack, RN 01/21/2015, 5:04 PM

## 2015-01-21 NOTE — Progress Notes (Signed)
[name] NAME: Jill Ellison    MR#:  [id number]  DATE OF BIRTH:  [birth year]  SUBJECTIVE:  Weaker today and short of breath, cough with sputum production  REVIEW OF SYSTEMS:  CONSTITUTIONAL: No fever, fatigue or weakness.  EYES: No blurred or double vision.  EARS, NOSE, AND THROAT: No tinnitus or ear pain.  RESPIRATORY: short of breath with exertion CARDIOVASCULAR: No chest pain, orthopnea, edema.  GASTROINTESTINAL: No nausea, vomiting, diarrhea or abdominal pain.  GENITOURINARY: No dysuria, hematuria.  ENDOCRINE: No polyuria, nocturia,  HEMATOLOGY: No anemia, easy bruising or bleeding SKIN: No rash or lesion. MUSCULOSKELETAL: No joint pain or arthritis.   NEUROLOGIC: No tingling, numbness, weakness.  PSYCHIATRY: No anxiety or depression.   DRUG ALLERGIES:  No Known Allergies  VITALS:  Blood pressure 110/62, pulse 40, temperature 97.8 F (36.6 C), temperature source Oral, resp. rate 18, height 5' 1"  (1.549 m), weight 57.244 kg (126 lb 3.2 oz), SpO2 95 %.  PHYSICAL EXAMINATION:  GENERAL:  81 y.o.-year-old patient lying in the bed with no acute distress him a frail, weak, short of breath  EYES: Pupils equal, round, reactive to light and accommodation. No scleral icterus. Extraocular muscles intact.  HEENT: Head atraumatic, normocephalic. Oropharynx and nasopharynx clear.  NECK:  Supple, no jugular venous distention. No thyroid enlargement, no tenderness.  LUNGS: Short of breath with conversation, scattered rhonchi CARDIOVASCULAR: S1, S2 normal. 3/6 SEM, rubs, or gallops.  ABDOMEN: Soft, nontender, nondistended. Bowel sounds present. No organomegaly or mass.  EXTREMITIES: No pedal edema, cyanosis, or clubbing.  NEUROLOGIC: Cranial nerves II through XII are intact. Muscle strength 5/5 in all extremities. Sensation intact. Gait not checked.  PSYCHIATRIC: The patient is alert and oriented x 3.  SKIN: No obvious rash,  lesion, or ulcer.    LABORATORY PANEL:   CBC  Recent Labs Lab 01/21/15 0511  WBC 7.1  HGB 8.5*  HCT 25.9*  PLT 563  DUPLICATE REQUEST   ------------------------------------------------------------------------------------------------------------------  Chemistries   Recent Labs Lab 01/17/15 1850  01/21/15 0511  NA 136  < > 142  K 3.9  < > 3.2*  CL 96*  < > 107  CO2 30  < > 27  GLUCOSE 110*  < > 101*  BUN 29*  < > 26*  CREATININE 1.46*  < > 8.75*  DUPLICATE REQUEST  CALCIUM 8.3*  < > 8.5*  AST 25  --   --   ALT 11*  --   --   ALKPHOS 47  --   --   BILITOT 0.7  --   --   < > = values in this interval not displayed. ------------------------------------------------------------------------------------------------------------------  Cardiac Enzymes  Recent Labs Lab 01/17/15 1850  TROPONINI <0.03   ------------------------------------------------------------------------------------------------------------------  RADIOLOGY:  Dg Chest Port 1 View  01/21/2015   CLINICAL DATA:  Follow-up left upper lobe pneumonia  EXAM: PORTABLE CHEST - 1 VIEW  COMPARISON:  01/17/2015  FINDINGS: Cardiomediastinal silhouette is stable. Atherosclerotic calcifications of thoracic aorta again noted. Persistent left upper lobe pneumonia. Worsening in aeration in left lower lobe suspicious for atelectasis or infiltrate. Probable small bilateral pleural effusion. No pulmonary edema.  IMPRESSION: Persistent left upper lobe pneumonia. Worsening in aeration in left lower lobe suspicious for atelectasis or infiltrate. Probable small bilateral pleural effusion. No pulmonary edema.   Electronically Signed   By: Lahoma Crocker M.D.   On: 01/21/2015 10:55    EKG:   Orders placed or performed  during the hospital encounter of 01/17/15  . ED EKG  . ED EKG  . EKG 12-Lead  . EKG 12-Lead    ASSESSMENT AND PLAN:   81 year old Caucasian female history of diastolic congestive heart failure, chronic atrial  fibrillation on warfarin for anticoagulation presenting with one-day duration nausea/vomiting as well as fever and shortness of breath.  1.Sepsis,  - met septic criteria on admission by heart rate, temperature, respiratory rate, leukocytosis Strep pneumo bacteremia sensitive to Levaquin Continue IV Levaquin, and given that her symptoms are worse today will restart Zosyn and vancomycin. Leukocytosis and fever improved  2. Atrial fibrillation with rapid ventricular response:  Rate controlled on oral medication, Cardizem and metoprolol  warfarin monitor INR She is actually bradycardic may need to back off of rate control agents. Stop metoprolol today.  3. Hypertension, essential: Controlled Continue lisinopril, metoprolol.  4. Chronic diastolic heart failure:  No exacerbation Continue  lisinopril, hold diuretics for now  5.Venous thromboembolism prophylactic: Therapeutic anticoagulation  #6 acute on chronic kidney disease stage III/4: Improving Continue gentle hydration, continue to monitor electrolytes and renal function  #7 community-acquired left upper lobe pneumonia: - Worsening respiratory symptoms and worsening chest x-ray this morning.  Blood cultures positive for Streptococcus pneumonia, continue Levaquin, broaden antibiotic coverage due to worsening symptoms. Add nebulizer treatments.  #8 acute respiratory failure with hypoxia Attempt to wean from nasal cannula again today today  #9 disposition. PT has recommended SNF. Patient and family did not want skilled nursing under any conditions. Today she is much weaker than prior.   All the records are reviewed and case discussed with Care Management/Social Workerr. Management plans discussed with the patient, family and they are in agreement.  CODE STATUS: Full. Appreciate palliative care consultation  TOTAL TIME TAKING CARE OF THIS PATIENT: 35 minutes.    [name] M.D on 01/21/2015 at 4:26 PM  Between 7am to 6pm -  Pager - [phone]  After 6pm go to [email or web address] - password [email or web address]  Office  [phone]  CC: Primary care physician; Margarita Rana, MD

## 2015-01-21 NOTE — Progress Notes (Addendum)
ANTIBIOTIC CONSULT NOTE - FOLLOW UP  Pharmacy Consult for Levaquin Indication: PNA   No Known Allergies  Patient Measurements: Height: 5\' 1"  (154.9 cm) Weight: 126 lb 3.2 oz (57.244 kg) IBW/kg (Calculated) : 47.8 Adjusted Body Weight: 50 kg  Vital Signs: Temp: 98.3 F (36.8 C) (05/13 0617) Temp Source: Oral (05/13 0617) BP: 127/58 mmHg (05/13 0752) Pulse Rate: 99 (05/13 0617)  Labs:  Recent Labs  01/19/15 0413 01/21/15 0511  WBC 9.2 7.1  HGB 8.8* 8.5*  PLT 704 888  DUPLICATE REQUEST  CREATININE 9.16* 9.45*  DUPLICATE REQUEST   CrCl: 11ml/min    Microbiology: Recent Results (from the past 720 hour(s))  Culture, blood (routine x 2)     Status: None (Preliminary result)   Collection Time: 01/17/15  6:50 PM  Result Value Ref Range Status   Specimen Description BLOOD  Final   Special Requests Normal  Final   Culture  Setup Time GRAM POSITIVE COCCI IN PAIRS ANAEROBIC BOTTLE ONLY  Final   Culture   Final    STREPTOCOCCUS PNEUMONIAE BLOOD ANAEROBIC BOTTLE CRITICAL RESULT CALLED TO, READ BACK BY AND VERIFIED WITH: CTJ TO PAMELA ALLEN AT 1155 01/18/15    Report Status PENDING  Incomplete   Organism ID, Bacteria STREPTOCOCCUS PNEUMONIAE  Final      Susceptibility   Streptococcus pneumoniae - MIC (ETEST)*    OXACILLIN DISK Value in next row       38SENSITIVE    CEFTRIAXONE Value in next row Sensitive      38SENSITIVE    PENICILLIN Value in next row Sensitive      38SENSITIVE    LEVOFLOXACIN Value in next row Sensitive      38SENSITIVE    VANCOMYCIN Value in next row Sensitive      38SENSITIVE    ERYTHROMYCIN Value in next row Sensitive      38SENSITIVE    * STREPTOCOCCUS PNEUMONIAE  Culture, blood (routine x 2)     Status: None (Preliminary result)   Collection Time: 01/17/15  6:50 PM  Result Value Ref Range Status   Specimen Description BLOOD  Final   Special Requests Normal  Final   Culture NO GROWTH 4 DAYS  Final   Report Status PENDING  Incomplete  C  difficile quick scan w PCR reflex [facility])     Status: None   Collection Time: 01/20/15  8:17 PM  Result Value Ref Range Status   C Diff antigen POSITIVE  Corrected    Comment: CORRECTED ON 05/13 AT 0742: PREVIOUSLY REPORTED AS POSITIVE CRITICAL RESULT CALLED TO, READ BACK BY AND VERIFIED WITH: IRIS GUIDRY AT 0135 01/21/15. TSH   C Diff toxin NEGATIVE  Final   C Diff interpretation   Corrected    Negative for toxigenic C. difficile. Toxin gene and active toxin production not detected. May be a nontoxigenic strain of C. difficile bacteria present, lacking the ability to produce toxin.    Comment: CORRECTED RESULTS CALLED TO: CATHY SUMMERLIN AT 0740 01/21/15 LAB CORRECTED ON 05/13 AT 0388: PREVIOUSLY REPORTED AS POSITIVE   Clostridium Difficile by PCR     Status: None   Collection Time: 01/20/15  8:17 PM  Result Value Ref Range Status   C difficile by pcr Toxigenic C.diff NEGATIVE NEGATIVE Final    Anti-infectives    Start     Dose/Rate Route Frequency Ordered Stop   01/21/15 1000  levofloxacin (LEVAQUIN) IVPB 500 mg     500 mg 100 mL/hr over 60  Minutes Intravenous Every 48 hours 01/20/15 1530     01/19/15 1800  levofloxacin (LEVAQUIN) IVPB 750 mg  Status:  Discontinued     750 mg 100 mL/hr over 90 Minutes Intravenous Every 48 hours 01/17/15 2249 01/20/15 1530   01/19/15 0200  vancomycin (VANCOCIN) IVPB 1000 mg/200 mL premix  Status:  Discontinued     1,000 mg 200 mL/hr over 60 Minutes Intravenous Every 36 hours 01/18/15 1259 01/18/15 1300   01/18/15 1400  piperacillin-tazobactam (ZOSYN) IVPB 3.375 g  Status:  Discontinued     3.375 g 12.5 mL/hr over 240 Minutes Intravenous 3 times per day 01/18/15 1259 01/19/15 1307   01/18/15 1400  vancomycin (VANCOCIN) IVPB 1000 mg/200 mL premix  Status:  Discontinued     1,000 mg 200 mL/hr over 60 Minutes Intravenous Every 36 hours 01/18/15 1300 01/19/15 1307   01/18/15 1330  vancomycin (VANCOCIN) IVPB 750 mg/150 ml premix  Status:  Discontinued      750 mg 150 mL/hr over 60 Minutes Intravenous  Once 01/18/15 1259 01/18/15 1300   01/17/15 1945  levofloxacin (LEVAQUIN) IVPB 750 mg     750 mg 100 mL/hr over 90 Minutes Intravenous  Once 01/17/15 1940 01/17/15 2155   01/17/15 1900  vancomycin (VANCOCIN) IVPB 1000 mg/200 mL premix     1,000 mg 200 mL/hr over 60 Minutes Intravenous  Once 01/17/15 1851 01/18/15 0140   01/17/15 1900  piperacillin-tazobactam (ZOSYN) IVPB 3.375 g     3.375 g 12.5 mL/hr over 240 Minutes Intravenous  Once 01/17/15 1851 01/17/15 1930      Assessment: Levaquin day 5 in this patient with CAP. Blood cultures (1 of 2) strep pneumo sensitive to levaquin.   Plan:  Current orders for levaquin 500mg  IV Q48H. Renal function improved and patient meets criteria to transition to PO at this time. Will change to levaquin 750 PO Q48H.    Rexene Edison, PharmD 01/21/2015,10:14 AM

## 2015-01-21 NOTE — Progress Notes (Signed)
ANTIBIOTIC CONSULT NOTE - INITIAL  Pharmacy Consult for Vancomycin/Zosyn/Levaquin  Indication: Pneumonia (possible aspiration)  No Known Allergies  Patient Measurements: Height: 5\' 1"  (154.9 cm) Weight: 126 lb 3.2 oz (57.244 kg) IBW/kg (Calculated) : 47.8  Vital Signs: Temp: 97.8 F (36.6 C) (05/13 1126) Temp Source: Oral (05/13 1126) BP: 110/62 mmHg (05/13 1126) Pulse Rate: 40 (05/13 1126) Intake/Output from previous day: 05/12 0701 - 05/13 0700 In: 3656.3 [I.V.:3656.3] Out: 1050 [Urine:1050] Intake/Output from this shift: Total I/O In: -  Out: 50 [Urine:50]  Labs:  Recent Labs  01/19/15 0413 01/21/15 0511  WBC 9.2 7.1  HGB 8.8* 8.5*  PLT 465 035  DUPLICATE REQUEST  CREATININE 4.65* 6.81*  DUPLICATE REQUEST   CrCl: 61ml/min  Microbiology: Recent Results (from the past 720 hour(s))  Culture, blood (routine x 2)     Status: None (Preliminary result)   Collection Time: 01/17/15  6:50 PM  Result Value Ref Range Status   Specimen Description BLOOD  Final   Special Requests Normal  Final   Culture  Setup Time GRAM POSITIVE COCCI IN PAIRS ANAEROBIC BOTTLE ONLY  Final   Culture   Final    STREPTOCOCCUS PNEUMONIAE BLOOD ANAEROBIC BOTTLE CRITICAL RESULT CALLED TO, READ BACK BY AND VERIFIED WITH: CTJ TO PAMELA ALLEN AT 1155 01/18/15    Report Status PENDING  Incomplete   Organism ID, Bacteria STREPTOCOCCUS PNEUMONIAE  Final      Susceptibility   Streptococcus pneumoniae - MIC (ETEST)*    OXACILLIN DISK Value in next row       38SENSITIVE    CEFTRIAXONE Value in next row Sensitive      38SENSITIVE    PENICILLIN Value in next row Sensitive      38SENSITIVE    LEVOFLOXACIN Value in next row Sensitive      38SENSITIVE    VANCOMYCIN Value in next row Sensitive      38SENSITIVE    ERYTHROMYCIN Value in next row Sensitive      38SENSITIVE    * STREPTOCOCCUS PNEUMONIAE  Culture, blood (routine x 2)     Status: None (Preliminary result)   Collection Time: 01/17/15   6:50 PM  Result Value Ref Range Status   Specimen Description BLOOD  Final   Special Requests Normal  Final   Culture NO GROWTH 4 DAYS  Final   Report Status PENDING  Incomplete  C difficile quick scan w PCR reflex [facility])     Status: None   Collection Time: 01/20/15  8:17 PM  Result Value Ref Range Status   C Diff antigen POSITIVE  Corrected    Comment: CORRECTED ON 05/13 AT 0742: PREVIOUSLY REPORTED AS POSITIVE CRITICAL RESULT CALLED TO, READ BACK BY AND VERIFIED WITH: IRIS GUIDRY AT 0135 01/21/15. TSH   C Diff toxin NEGATIVE  Final   C Diff interpretation   Corrected    Negative for toxigenic C. difficile. Toxin gene and active toxin production not detected. May be a nontoxigenic strain of C. difficile bacteria present, lacking the ability to produce toxin.    Comment: CORRECTED RESULTS CALLED TO: CATHY SUMMERLIN AT 0740 01/21/15 LAB CORRECTED ON 05/13 AT 2751: PREVIOUSLY REPORTED AS POSITIVE   Clostridium Difficile by PCR     Status: None   Collection Time: 01/20/15  8:17 PM  Result Value Ref Range Status   C difficile by pcr Toxigenic C.diff NEGATIVE NEGATIVE Final    Medical History: Past Medical History  Diagnosis Date  . Swelling of ankle   .  SOB (shortness of breath)   . Hypertension   . Asthma   . Arthritis   . Atrial fibrillation 2011  . Cancer 2011    Left breast  . Cancer of leg   . Malignant neoplasm of upper-outer quadrant of female breast     left   . Memory loss   . Dementia   . GERD (gastroesophageal reflux disease)   . Hyperlipidemia   . Anemia   . Iron deficiency   . History of colonic polyps   . Lumbago     Medications:  Prescriptions prior to admission  Medication Sig Dispense Refill Last Dose  . calcium-vitamin D (OSCAL WITH D) 500-200 MG-UNIT per tablet Take 1 tablet by mouth 2 (two) times daily.     Past Month at Unknown time  . Cholecalciferol (VITAMIN D3) 1000 UNITS CAPS Take 1 capsule by mouth daily.     Past Month at Unknown time  .  diltiazem (CARDIZEM CD) 120 MG 24 hr capsule Take 1 capsule (120 mg total) by mouth daily. 30 capsule 11 01/16/2015 at Unknown time  . furosemide (LASIX) 20 MG tablet TAKE 1 TABLET EVERY DAY 30 tablet 6 01/16/2015 at Unknown time  . KLOR-CON M20 20 MEQ tablet TAKE 1 TABLET BY MOUTH EVERY DAY 30 tablet 3 01/16/2015 at Unknown time  . letrozole (FEMARA) 2.5 MG tablet Take 2.5 mg by mouth daily.     01/16/2015 at Unknown time  . lisinopril (PRINIVIL,ZESTRIL) 20 MG tablet TAKE 1 TABLET BY MOUTH DAILY 30 tablet 6 01/16/2015 at Unknown time  . Magnesium 250 MG TABS Take 2 tablets by mouth daily.    Past Month at Unknown time  . metoprolol succinate (TOPROL-XL) 25 MG 24 hr tablet Take 25 mg by mouth daily.   01/16/2015 at 1700  . Multiple Vitamin (MULTIVITAMIN) tablet Take 1 tablet by mouth daily.     Past Month at Unknown time  . warfarin (COUMADIN) 5 MG tablet Take 5 mg by mouth daily.   01/16/2015 at Unknown time  . fexofenadine (ALLEGRA) 180 MG tablet Take 180 mg by mouth daily as needed for allergies.    prn  . levalbuterol (XOPENEX HFA) 45 MCG/ACT inhaler Inhale 1-2 puffs into the lungs every 4 (four) hours as needed.     rescue  . PROAIR HFA 108 (90 BASE) MCG/ACT inhaler Inhale 2 puffs into the lungs every 6 (six) hours as needed for wheezing or shortness of breath.   2 rescue  . warfarin (COUMADIN) 2 MG tablet TAKE AS DIRECTED BY ANTICOAGULATION CLINIC (Patient not taking: Reported on 01/17/2015) 75 tablet 3 Taking   Scheduled:  . calcium-vitamin D  1 tablet Oral BID  . cholecalciferol  1,000 Units Oral Daily  . diltiazem  120 mg Oral Daily  . heparin  5,000 Units Subcutaneous 3 times per day  . ipratropium-albuterol  3 mL Nebulization Q4H  . letrozole  2.5 mg Oral Daily  . [START ON 01/23/2015] levofloxacin  750 mg Oral Q48H  . lisinopril  20 mg Oral Daily  . loratadine  10 mg Oral Daily  . magnesium oxide  800 mg Oral Daily  . metoprolol succinate  25 mg Oral Daily  . multivitamin with minerals  1 tablet  Oral Daily  . piperacillin-tazobactam (ZOSYN)  IV  3.375 g Intravenous 3 times per day  . vancomycin  1,000 mg Intravenous Once  . [START ON 01/22/2015] vancomycin  750 mg Intravenous Q36H  . [START ON 01/22/2015] warfarin  4 mg Oral q1800  . warfarin  5 mg Oral ONCE-1800   Infusions:  . sodium chloride 75 mL/hr at 01/21/15 0509   PRN: acetaminophen **OR** acetaminophen, albuterol, ondansetron **OR** ondansetron (ZOFRAN) IV Anti-infectives    Start     Dose/Rate Route Frequency Ordered Stop   01/23/15 0900  levofloxacin (LEVAQUIN) tablet 750 mg     750 mg Oral Every 48 hours 01/21/15 1028     01/22/15 1400  vancomycin (VANCOCIN) IVPB 750 mg/150 ml premix     750 mg 150 mL/hr over 60 Minutes Intravenous Every 36 hours 01/21/15 1438     01/21/15 1400  piperacillin-tazobactam (ZOSYN) IVPB 3.375 g     3.375 g 12.5 mL/hr over 240 Minutes Intravenous 3 times per day 01/21/15 1152     01/21/15 1300  vancomycin (VANCOCIN) IVPB 1000 mg/200 mL premix     1,000 mg 200 mL/hr over 60 Minutes Intravenous  Once 01/21/15 1152     01/21/15 1000  levofloxacin (LEVAQUIN) IVPB 500 mg  Status:  Discontinued     500 mg 100 mL/hr over 60 Minutes Intravenous Every 48 hours 01/20/15 1530 01/21/15 1028   01/19/15 1800  levofloxacin (LEVAQUIN) IVPB 750 mg  Status:  Discontinued     750 mg 100 mL/hr over 90 Minutes Intravenous Every 48 hours 01/17/15 2249 01/20/15 1530   01/19/15 0200  vancomycin (VANCOCIN) IVPB 1000 mg/200 mL premix  Status:  Discontinued     1,000 mg 200 mL/hr over 60 Minutes Intravenous Every 36 hours 01/18/15 1259 01/18/15 1300   01/18/15 1400  piperacillin-tazobactam (ZOSYN) IVPB 3.375 g  Status:  Discontinued     3.375 g 12.5 mL/hr over 240 Minutes Intravenous 3 times per day 01/18/15 1259 01/19/15 1307   01/18/15 1400  vancomycin (VANCOCIN) IVPB 1000 mg/200 mL premix  Status:  Discontinued     1,000 mg 200 mL/hr over 60 Minutes Intravenous Every 36 hours 01/18/15 1300 01/19/15 1307    01/18/15 1330  vancomycin (VANCOCIN) IVPB 750 mg/150 ml premix  Status:  Discontinued     750 mg 150 mL/hr over 60 Minutes Intravenous  Once 01/18/15 1259 01/18/15 1300   01/17/15 1945  levofloxacin (LEVAQUIN) IVPB 750 mg     750 mg 100 mL/hr over 90 Minutes Intravenous  Once 01/17/15 1940 01/17/15 2155   01/17/15 1900  vancomycin (VANCOCIN) IVPB 1000 mg/200 mL premix     1,000 mg 200 mL/hr over 60 Minutes Intravenous  Once 01/17/15 1851 01/18/15 0140   01/17/15 1900  piperacillin-tazobactam (ZOSYN) IVPB 3.375 g     3.375 g 12.5 mL/hr over 240 Minutes Intravenous  Once 01/17/15 1851 01/17/15 1930     Assessment: Levaquin day 5 in this patient with CAP. Blood cultures (1 of 2) strep pneumo sensitive to levaquin. Per MD, patient significantly worse clinically and CXR worse. Possible aspiration event? Adding vanc/zosyn for additional coverage.  Goal of Therapy:  Vancomycin trough level 15-20 mcg/ml  Plan:  Zosyn 3.375gm IV Q8H extended infusion  Vancomycin 1gm x 1 given today, will follow with vancomycin 750mg  IV Q36H to start 5/14 at 14:00 for stacked dosing. Trough ordered prior to third dose 5/17 at 13:30. This will not be at steady state.  Will continue levaquin 750mg  PO Q48H at this time.   Pharmacy to follow per consult  Rexene Edison, PharmD 01/21/2015,2:39 PM

## 2015-01-21 NOTE — Care Management Note (Signed)
Case Management Note  Patient Details  Name: Jill Ellison MRN: [id number] Date of Birth: 1917/02/19  Subjective/Objective:                    Action/Plan:   Expected Discharge Date:                  Expected Discharge Plan:     In-House Referral:     Discharge planning Services     Post Acute Care Choice:    Choice offered to:     DME Arranged:    DME Agency:  [place]:    [place]:     Status of Service:     Medicare Important Message Given:  Yes Date Medicare IM Given:  01/20/15 Medicare IM give by:  Joni Reining Date Additional Medicare IM Given:   01/21/15 Additional Medicare Important Message give by:   Joni Reining  If discussed at Long Length of Stay Meetings, dates discussed:    Additional Comments:  Katrina Stack, RN 01/21/2015, 3:34 PM

## 2015-01-22 LAB — BASIC METABOLIC PANEL
Anion gap: 4 — ABNORMAL LOW (ref 5–15)
BUN: 26 mg/dL — ABNORMAL HIGH (ref 6–20)
CO2: 28 mmol/L (ref 22–32)
Calcium: 8.8 mg/dL — ABNORMAL LOW (ref 8.9–10.3)
Chloride: 110 mmol/L (ref 101–111)
Creatinine, Ser: 1.3 mg/dL — ABNORMAL HIGH (ref 0.44–1.00)
GFR calc non Af Amer: 34 mL/min — ABNORMAL LOW (ref >60)
GFR, EST AFRICAN AMERICAN: 39 mL/min — AB (ref >60)
Glucose, Bld: 102 mg/dL — ABNORMAL HIGH (ref 65–99)
Potassium: 3.2 mmol/L — ABNORMAL LOW (ref 3.5–5.1)
Sodium: 142 mmol/L (ref 135–145)

## 2015-01-22 LAB — CBC
HEMATOCRIT: 28.4 % — AB (ref 35.0–47.0)
Hemoglobin: 9 g/dL — ABNORMAL LOW (ref 12.0–16.0)
MCH: 30.7 pg (ref 26.0–34.0)
MCHC: 31.6 g/dL — ABNORMAL LOW (ref 32.0–36.0)
MCV: 97.1 fL (ref 80.0–100.0)
Platelets: 171 10*3/uL (ref 150–440)
RBC: 2.92 MIL/uL — ABNORMAL LOW (ref 3.80–5.20)
RDW: 14.1 % (ref 11.5–14.5)
WBC: 6.9 10*3/uL (ref 3.6–11.0)

## 2015-01-22 LAB — CULTURE, BLOOD (ROUTINE X 2)
CULTURE: NO GROWTH
SPECIAL REQUESTS: NORMAL
Special Requests: NORMAL

## 2015-01-22 LAB — PROTIME-INR
INR: 1.79
PROTHROMBIN TIME: 21 s — AB (ref 11.4–15.0)

## 2015-01-22 MED ORDER — PIPERACILLIN-TAZOBACTAM 3.375 G IVPB
3.3750 g | Freq: Two times a day (BID) | INTRAVENOUS | Status: DC
  Filled 2015-01-22 (×2): qty 50

## 2015-01-22 MED ORDER — LEVOFLOXACIN 500 MG PO TABS
500.0000 mg | ORAL_TABLET | ORAL | Status: DC
  Administered 2015-01-23 – 2015-01-27 (×3): 500 mg via ORAL
  Filled 2015-01-22 (×3): qty 1

## 2015-01-22 MED ORDER — CEFTRIAXONE SODIUM IN DEXTROSE 40 MG/ML IV SOLN
2.0000 g | INTRAVENOUS | Status: DC
  Administered 2015-01-22 – 2015-01-26 (×5): 2 g via INTRAVENOUS
  Filled 2015-01-22 (×8): qty 50

## 2015-01-22 MED ORDER — FUROSEMIDE 10 MG/ML IJ SOLN
20.0000 mg | Freq: Once | INTRAMUSCULAR | Status: AC
  Administered 2015-01-22: 20 mg via INTRAVENOUS
  Filled 2015-01-22: qty 2

## 2015-01-22 MED ORDER — POTASSIUM CHLORIDE CRYS ER 20 MEQ PO TBCR
40.0000 meq | EXTENDED_RELEASE_TABLET | Freq: Once | ORAL | Status: AC
  Administered 2015-01-22: 40 meq via ORAL
  Filled 2015-01-22: qty 2

## 2015-01-22 MED ORDER — IPRATROPIUM-ALBUTEROL 0.5-2.5 (3) MG/3ML IN SOLN
3.0000 mL | Freq: Four times a day (QID) | RESPIRATORY_TRACT | Status: DC
  Administered 2015-01-23 – 2015-01-28 (×23): 3 mL via RESPIRATORY_TRACT
  Filled 2015-01-22 (×23): qty 3

## 2015-01-22 NOTE — Progress Notes (Signed)
ANTIBIOTIC CONSULT NOTE - INITIAL  Pharmacy Consult for Vancomycin/Zosyn/Levaquin  Indication: Pneumonia   No Known Allergies  Patient Measurements: Height: 5\' 1"  (154.9 cm) Weight: 125 lb (56.7 kg) IBW/kg (Calculated) : 47.8  Vital Signs: Temp: 98.4 F (36.9 C) (05/14 1119) Temp Source: Oral (05/14 1119) BP: 132/52 mmHg (05/14 1119) Pulse Rate: 66 (05/14 1119) Intake/Output from previous day: 05/13 0701 - 05/14 0700 In: 862.5 [I.V.:762.5; IV Piggyback:100] Out: 450 [Urine:450] Intake/Output from this shift:    Labs:  Recent Labs  01/21/15 0511 01/22/15 0515  WBC 7.1 6.9  HGB 8.5* 9.0*  PLT 638  DUPLICATE REQUEST 756  CREATININE 4.33*  DUPLICATE REQUEST 2.95*   Estimated Creatinine Clearance: 19.1 mL/min (by C-G formula based on Cr of 1.3).  Microbiology: Recent Results (from the past 720 hour(s))  Culture, blood (routine x 2)     Status: None   Collection Time: 01/17/15  6:50 PM  Result Value Ref Range Status   Specimen Description BLOOD  Final   Special Requests Normal  Final   Culture  Setup Time GRAM POSITIVE COCCI IN PAIRS ANAEROBIC BOTTLE ONLY  Final   Culture   Final    STREPTOCOCCUS PNEUMONIAE BLOOD ANAEROBIC BOTTLE CRITICAL RESULT CALLED TO, READ BACK BY AND VERIFIED WITH: CTJ TO PAMELA ALLEN AT 1155 01/18/15    Report Status 01/22/2015 FINAL  Final   Organism ID, Bacteria STREPTOCOCCUS PNEUMONIAE  Final      Susceptibility   Streptococcus pneumoniae - MIC (ETEST)*    OXACILLIN DISK Value in next row       38SENSITIVE    CEFTRIAXONE Value in next row Sensitive      38SENSITIVE    PENICILLIN Value in next row Sensitive      38SENSITIVE    LEVOFLOXACIN Value in next row Sensitive      38SENSITIVE    VANCOMYCIN Value in next row Sensitive      38SENSITIVE    ERYTHROMYCIN Value in next row Sensitive      38SENSITIVE    * STREPTOCOCCUS PNEUMONIAE  Culture, blood (routine x 2)     Status: None   Collection Time: 01/17/15  6:50 PM  Result  Value Ref Range Status   Specimen Description BLOOD  Final   Special Requests Normal  Final   Culture NO GROWTH 5 DAYS  Final   Report Status 01/22/2015 FINAL  Final  C difficile quick scan w PCR reflex ([facility])     Status: None   Collection Time: 01/20/15  8:17 PM  Result Value Ref Range Status   C Diff antigen POSITIVE  Corrected    Comment: CORRECTED ON 05/13 AT 0742: PREVIOUSLY REPORTED AS POSITIVE CRITICAL RESULT CALLED TO, READ BACK BY AND VERIFIED WITH: IRIS GUIDRY AT 0135 01/21/15. TSH   C Diff toxin NEGATIVE  Final   C Diff interpretation   Corrected    Negative for toxigenic C. difficile. Toxin gene and active toxin production not detected. May be a nontoxigenic strain of C. difficile bacteria present, lacking the ability to produce toxin.    Comment: CORRECTED RESULTS CALLED TO: CATHY SUMMERLIN AT 0740 01/21/15 LAB CORRECTED ON 05/13 AT 1884: PREVIOUSLY REPORTED AS POSITIVE   Clostridium Difficile by PCR     Status: None   Collection Time: 01/20/15  8:17 PM  Result Value Ref Range Status   C difficile by pcr Toxigenic C.diff NEGATIVE NEGATIVE Final  Culture, blood (routine x 2)     Status: None (Preliminary result)  Collection Time: 01/21/15 11:09 AM  Result Value Ref Range Status   Specimen Description BLOOD  Final   Special Requests Normal  Final   Culture NO GROWTH < 24 HOURS  Final   Report Status PENDING  Incomplete  Culture, blood (routine x 2)     Status: None (Preliminary result)   Collection Time: 01/21/15 11:17 AM  Result Value Ref Range Status   Specimen Description BLOOD  Final   Special Requests Normal  Final   Culture NO GROWTH < 24 HOURS  Final   Report Status PENDING  Incomplete    Medical History: Past Medical History  Diagnosis Date  . Swelling of ankle   . SOB (shortness of breath)   . Hypertension   . Asthma   . Arthritis   . Atrial fibrillation 2011  . Cancer 2011    Left breast  . Cancer of leg   . Malignant neoplasm of upper-outer  quadrant of female breast     left   . Memory loss   . Dementia   . GERD (gastroesophageal reflux disease)   . Hyperlipidemia   . Anemia   . Iron deficiency   . History of colonic polyps   . Lumbago     Medications:  Prescriptions prior to admission  Medication Sig Dispense Refill Last Dose  . calcium-vitamin D (OSCAL WITH D) 500-200 MG-UNIT per tablet Take 1 tablet by mouth 2 (two) times daily.     Past Month at Unknown time  . Cholecalciferol (VITAMIN D3) 1000 UNITS CAPS Take 1 capsule by mouth daily.     Past Month at Unknown time  . diltiazem (CARDIZEM CD) 120 MG 24 hr capsule Take 1 capsule (120 mg total) by mouth daily. 30 capsule 11 01/16/2015 at Unknown time  . furosemide (LASIX) 20 MG tablet TAKE 1 TABLET EVERY DAY 30 tablet 6 01/16/2015 at Unknown time  . KLOR-CON M20 20 MEQ tablet TAKE 1 TABLET BY MOUTH EVERY DAY 30 tablet 3 01/16/2015 at Unknown time  . letrozole (FEMARA) 2.5 MG tablet Take 2.5 mg by mouth daily.     01/16/2015 at Unknown time  . lisinopril (PRINIVIL,ZESTRIL) 20 MG tablet TAKE 1 TABLET BY MOUTH DAILY 30 tablet 6 01/16/2015 at Unknown time  . Magnesium 250 MG TABS Take 2 tablets by mouth daily.    Past Month at Unknown time  . metoprolol succinate (TOPROL-XL) 25 MG 24 hr tablet Take 25 mg by mouth daily.   01/16/2015 at 1700  . Multiple Vitamin (MULTIVITAMIN) tablet Take 1 tablet by mouth daily.     Past Month at Unknown time  . warfarin (COUMADIN) 5 MG tablet Take 5 mg by mouth daily.   01/16/2015 at Unknown time  . fexofenadine (ALLEGRA) 180 MG tablet Take 180 mg by mouth daily as needed for allergies.    prn  . levalbuterol (XOPENEX HFA) 45 MCG/ACT inhaler Inhale 1-2 puffs into the lungs every 4 (four) hours as needed.     rescue  . PROAIR HFA 108 (90 BASE) MCG/ACT inhaler Inhale 2 puffs into the lungs every 6 (six) hours as needed for wheezing or shortness of breath.   2 rescue  . warfarin (COUMADIN) 2 MG tablet TAKE AS DIRECTED BY ANTICOAGULATION CLINIC (Patient not  taking: Reported on 01/17/2015) 75 tablet 3 Taking   Scheduled:  . calcium-vitamin D  1 tablet Oral BID  . cholecalciferol  1,000 Units Oral Daily  . diltiazem  120 mg Oral Daily  .  heparin  5,000 Units Subcutaneous 3 times per day  . ipratropium-albuterol  3 mL Nebulization Q4H  . letrozole  2.5 mg Oral Daily  . [START ON 01/23/2015] levofloxacin  750 mg Oral Q48H  . lisinopril  20 mg Oral Daily  . loratadine  10 mg Oral Daily  . magnesium oxide  800 mg Oral Daily  . multivitamin with minerals  1 tablet Oral Daily  . piperacillin-tazobactam (ZOSYN)  IV  3.375 g Intravenous 3 times per day  . vancomycin  750 mg Intravenous Q36H  . warfarin  4 mg Oral q1800   Infusions:  . sodium chloride 75 mL/hr at 01/21/15 2050   PRN: acetaminophen **OR** acetaminophen, albuterol, ondansetron **OR** ondansetron (ZOFRAN) IV Anti-infectives    Start     Dose/Rate Route Frequency Ordered Stop   01/23/15 0900  levofloxacin (LEVAQUIN) tablet 750 mg     750 mg Oral Every 48 hours 01/21/15 1028     01/22/15 1400  vancomycin (VANCOCIN) IVPB 750 mg/150 ml premix     750 mg 150 mL/hr over 60 Minutes Intravenous Every 36 hours 01/21/15 1438     01/21/15 1400  piperacillin-tazobactam (ZOSYN) IVPB 3.375 g     3.375 g 12.5 mL/hr over 240 Minutes Intravenous 3 times per day 01/21/15 1152     01/21/15 1300  vancomycin (VANCOCIN) IVPB 1000 mg/200 mL premix     1,000 mg 200 mL/hr over 60 Minutes Intravenous  Once 01/21/15 1152 01/21/15 1500   01/21/15 1000  levofloxacin (LEVAQUIN) IVPB 500 mg  Status:  Discontinued     500 mg 100 mL/hr over 60 Minutes Intravenous Every 48 hours 01/20/15 1530 01/21/15 1028   01/19/15 1800  levofloxacin (LEVAQUIN) IVPB 750 mg  Status:  Discontinued     750 mg 100 mL/hr over 90 Minutes Intravenous Every 48 hours 01/17/15 2249 01/20/15 1530   01/19/15 0200  vancomycin (VANCOCIN) IVPB 1000 mg/200 mL premix  Status:  Discontinued     1,000 mg 200 mL/hr over 60 Minutes Intravenous  Every 36 hours 01/18/15 1259 01/18/15 1300   01/18/15 1400  piperacillin-tazobactam (ZOSYN) IVPB 3.375 g  Status:  Discontinued     3.375 g 12.5 mL/hr over 240 Minutes Intravenous 3 times per day 01/18/15 1259 01/19/15 1307   01/18/15 1400  vancomycin (VANCOCIN) IVPB 1000 mg/200 mL premix  Status:  Discontinued     1,000 mg 200 mL/hr over 60 Minutes Intravenous Every 36 hours 01/18/15 1300 01/19/15 1307   01/18/15 1330  vancomycin (VANCOCIN) IVPB 750 mg/150 ml premix  Status:  Discontinued     750 mg 150 mL/hr over 60 Minutes Intravenous  Once 01/18/15 1259 01/18/15 1300   01/17/15 1945  levofloxacin (LEVAQUIN) IVPB 750 mg     750 mg 100 mL/hr over 90 Minutes Intravenous  Once 01/17/15 1940 01/17/15 2155   01/17/15 1900  vancomycin (VANCOCIN) IVPB 1000 mg/200 mL premix     1,000 mg 200 mL/hr over 60 Minutes Intravenous  Once 01/17/15 1851 01/18/15 0140   01/17/15 1900  piperacillin-tazobactam (ZOSYN) IVPB 3.375 g     3.375 g 12.5 mL/hr over 240 Minutes Intravenous  Once 01/17/15 1851 01/17/15 1930     Assessment: Blood cultures (1 of 2) strep pneumo sensitive to levaquin. Per MD, patient significantly worse clinically and CXR worse 5/13. Possible aspiration event? Added vanc/zosyn for additional coverage.  Goal of Therapy:  Vancomycin trough level 15-20 mcg/ml  Plan:  Bump in SCr to 1.3, estCrCl: 19.77ml/min - will  adjust levaquin and zosyn accordingly. Will continue to follow renal function.  Zosyn 3.375gm IV Q8H extended infusion, changed to 3.375 Q12H.  Will continue levaquin 750mg  PO Q48H, changed to 500mg  PO Q48H  Will continue with vancomycin 750mg  IV Q36H.Trough ordered prior to third dose 5/17 at 13:30. This will not be at steady state.   Pharmacy to follow per consult  Rexene Edison, PharmD 01/22/2015,11:24 AM

## 2015-01-22 NOTE — Plan of Care (Signed)
Problem: SLP Dysphagia Goals Goal: Misc Dysphagia Goal Pt will safely tolerate po diet of least restrictive consistency w/ no overt s/s of aspiration noted by Staff/pt/family x1-2 sessions.

## 2015-01-22 NOTE — Evaluation (Signed)
Clinical/Bedside Swallow Evaluation Patient Details  Name: Jill Ellison MRN: [id number] Date of Birth: 23-Feb-1917  Today's Date: 01/22/2015 Time: SLP Start Time (ACUTE ONLY): 8185 SLP Stop Time (ACUTE ONLY): 1125 SLP Time Calculation (min) (ACUTE ONLY): 50 min  Past Medical History:  Past Medical History  Diagnosis Date  . Swelling of ankle   . SOB (shortness of breath)   . Hypertension   . Asthma   . Arthritis   . Atrial fibrillation 2011  . Cancer 2011    Left breast  . Cancer of leg   . Malignant neoplasm of upper-outer quadrant of female breast     left   . Memory loss   . Dementia   . GERD (gastroesophageal reflux disease)   . Hyperlipidemia   . Anemia   . Iron deficiency   . History of colonic polyps   . Lumbago    Past Surgical History:  Past Surgical History  Procedure Laterality Date  . Skin cancer excision  2008    right leg  . Mastectomy    . Colonoscopy  2010  . Breast surgery Left 03/2010    mastectomy  . Breast biopsy Left 2009   HPI:  pt and family denied any trouble swallowing in the past prior to this hospitalization. Pt stated she ate a fairly regular diet of soft foods d/t lacking most dentition. Dtr. agreed.   Assessment / Plan / Recommendation Clinical Impression  Pt appears to present w/ adequate oropharyngeal phase swallow function w/ reduced risk for aspiration at this time when following general aspiration precautions including rest breaks during po trials to lessen any increased WOB/SOB as pt appears to fatigue easily w/ exedrtion. Rec. a mech soft/regular diet w/ thin liquids w/ general aspiration precautions; meds w/ liquids as tolerates but whole w/ puree if nec.     Aspiration Risk  None (reduced sec. to mild Cognitive change(per Dtr.))    Diet Recommendation Dysphagia 3 (Mech soft);Thin   Medication Administration: Other (Comment) (whole in puree if indicated) Compensations: Small sips/bites;Slow rate    Other  Recommendations  Oral Care Recommendations: Oral care BID   Follow Up Recommendations   monitor toleration of diet     Frequency and Duration  2-3x  1 week   Pertinent Vitals/Pain denied    SLP Swallow Goals     Swallow Study Prior Functional Status   pt lived w/ Dtr who assisted pt in her ADLs. Pt was able to feed self I. Dtr. Has noted mild Cognitive change in recent year.    General Date of Onset: 01/17/15 Other Pertinent Information: pt and family denied any trouble swallowing in the past prior to this hospitalization. Pt stated she ate a fairly regular diet of soft foods d/t lacking most dentition. Dtr. agreed. Type of Study: Bedside swallow evaluation Previous Swallow Assessment: none indicated Diet Prior to this Study: Regular;Thin liquids;Other (Comment) (cooked foods; softer) Temperature Spikes Noted: No Respiratory Status: Increased respiratory rate/work of breathing Trach Size and Type: Other (Comment) (min. increased RR) History of Recent Intubation: No Behavior/Cognition: Alert;Cooperative;Pleasant mood;Other (Comment) (min. HOH; fatigued easily) Oral Cavity - Dentition: Missing dentition Self-Feeding Abilities: Able to feed self;Needs set up;Needs assist Patient Positioning: Upright in bed Baseline Vocal Quality: Normal Volitional Cough: Strong Volitional Swallow: Able to elicit    Oral/Motor/Sensory Function Overall Oral Motor/Sensory Function: Appears within functional limits for tasks assessed   Ice Chips Ice chips: Within functional limits Presentation: Spoon Other Comments: x2   Thin Liquid  Thin Liquid: Within functional limits Presentation: Cup;Straw Other Comments:  (4-5 trials via cup; 4 trials via straw)    Nectar Thick Nectar Thick Liquid: Not tested   Honey Thick Honey Thick Liquid: Not tested   Puree Puree: Within functional limits Presentation: Spoon;Self Fed Other Comments:  (6 trials)   Solid   GO    Solid: Within functional limits Presentation: Self  Fed Other Comments: 1 trial       Jill Ellison 01/22/2015,11:30 AM

## 2015-01-22 NOTE — Progress Notes (Signed)
[facility] for Coumadin Indication: atrial fibrillation  No Known Allergies  Patient Measurements: Height: 5\' 1"  (154.9 cm) Weight: 125 lb (56.7 kg) IBW/kg (Calculated) : 47.8  Vital Signs: Temp: 98.1 F (36.7 C) (05/14 0451) Temp Source: Oral (05/14 0451) BP: 110/56 mmHg (05/14 0451) Pulse Rate: 106 (05/14 0451)  Labs:  Recent Labs  01/20/15 0439 01/21/15 0511 01/22/15 0515  HGB  --  8.5* 9.0*  HCT  --  26.6* 28.4*  PLT  --  263  DUPLICATE REQUEST 335  LABPROT 17.4* 18.8* 21.0*  INR 1.40 1.55 1.79  CREATININE  --  4.56*  DUPLICATE REQUEST 2.56*    Medical History: Past Medical History  Diagnosis Date  . Swelling of ankle   . SOB (shortness of breath)   . Hypertension   . Asthma   . Arthritis   . Atrial fibrillation 2011  . Cancer 2011    Left breast  . Cancer of leg   . Malignant neoplasm of upper-outer quadrant of female breast     left   . Memory loss   . Dementia   . GERD (gastroesophageal reflux disease)   . Hyperlipidemia   . Anemia   . Iron deficiency   . History of colonic polyps   . Lumbago     Medications:  Prescriptions prior to admission  Medication Sig Dispense Refill Last Dose  . calcium-vitamin D (OSCAL WITH D) 500-200 MG-UNIT per tablet Take 1 tablet by mouth 2 (two) times daily.     Past Month at Unknown time  . Cholecalciferol (VITAMIN D3) 1000 UNITS CAPS Take 1 capsule by mouth daily.     Past Month at Unknown time  . diltiazem (CARDIZEM CD) 120 MG 24 hr capsule Take 1 capsule (120 mg total) by mouth daily. 30 capsule 11 01/16/2015 at Unknown time  . furosemide (LASIX) 20 MG tablet TAKE 1 TABLET EVERY DAY 30 tablet 6 01/16/2015 at Unknown time  . KLOR-CON M20 20 MEQ tablet TAKE 1 TABLET BY MOUTH EVERY DAY 30 tablet 3 01/16/2015 at Unknown time  . letrozole (FEMARA) 2.5 MG tablet Take 2.5 mg by mouth daily.     01/16/2015 at Unknown time  . lisinopril (PRINIVIL,ZESTRIL) 20 MG tablet TAKE 1 TABLET BY MOUTH  DAILY 30 tablet 6 01/16/2015 at Unknown time  . Magnesium 250 MG TABS Take 2 tablets by mouth daily.    Past Month at Unknown time  . metoprolol succinate (TOPROL-XL) 25 MG 24 hr tablet Take 25 mg by mouth daily.   01/16/2015 at 1700  . Multiple Vitamin (MULTIVITAMIN) tablet Take 1 tablet by mouth daily.     Past Month at Unknown time  . warfarin (COUMADIN) 5 MG tablet Take 5 mg by mouth daily.   01/16/2015 at Unknown time  . fexofenadine (ALLEGRA) 180 MG tablet Take 180 mg by mouth daily as needed for allergies.    prn  . levalbuterol (XOPENEX HFA) 45 MCG/ACT inhaler Inhale 1-2 puffs into the lungs every 4 (four) hours as needed.     rescue  . PROAIR HFA 108 (90 BASE) MCG/ACT inhaler Inhale 2 puffs into the lungs every 6 (six) hours as needed for wheezing or shortness of breath.   2 rescue  . warfarin (COUMADIN) 2 MG tablet TAKE AS DIRECTED BY ANTICOAGULATION CLINIC (Patient not taking: Reported on 01/17/2015) 75 tablet 3 Taking   Scheduled:  . calcium-vitamin D  1 tablet Oral BID  . cholecalciferol  1,000 Units Oral  Daily  . diltiazem  120 mg Oral Daily  . heparin  5,000 Units Subcutaneous 3 times per day  . ipratropium-albuterol  3 mL Nebulization Q4H  . letrozole  2.5 mg Oral Daily  . [START ON 01/23/2015] levofloxacin  750 mg Oral Q48H  . lisinopril  20 mg Oral Daily  . loratadine  10 mg Oral Daily  . magnesium oxide  800 mg Oral Daily  . multivitamin with minerals  1 tablet Oral Daily  . piperacillin-tazobactam (ZOSYN)  IV  3.375 g Intravenous 3 times per day  . vancomycin  750 mg Intravenous Q36H  . warfarin  4 mg Oral q1800   Infusions:  . sodium chloride 75 mL/hr at 01/21/15 2050   PRN: acetaminophen **OR** acetaminophen, albuterol, ondansetron **OR** ondansetron (ZOFRAN) IV Anti-infectives    Start     Dose/Rate Route Frequency Ordered Stop   01/23/15 0900  levofloxacin (LEVAQUIN) tablet 750 mg     750 mg Oral Every 48 hours 01/21/15 1028     01/22/15 1400  vancomycin (VANCOCIN)  IVPB 750 mg/150 ml premix     750 mg 150 mL/hr over 60 Minutes Intravenous Every 36 hours 01/21/15 1438     01/21/15 1400  piperacillin-tazobactam (ZOSYN) IVPB 3.375 g     3.375 g 12.5 mL/hr over 240 Minutes Intravenous 3 times per day 01/21/15 1152     01/21/15 1300  vancomycin (VANCOCIN) IVPB 1000 mg/200 mL premix     1,000 mg 200 mL/hr over 60 Minutes Intravenous  Once 01/21/15 1152 01/21/15 1500   01/21/15 1000  levofloxacin (LEVAQUIN) IVPB 500 mg  Status:  Discontinued     500 mg 100 mL/hr over 60 Minutes Intravenous Every 48 hours 01/20/15 1530 01/21/15 1028   01/19/15 1800  levofloxacin (LEVAQUIN) IVPB 750 mg  Status:  Discontinued     750 mg 100 mL/hr over 90 Minutes Intravenous Every 48 hours 01/17/15 2249 01/20/15 1530   01/19/15 0200  vancomycin (VANCOCIN) IVPB 1000 mg/200 mL premix  Status:  Discontinued     1,000 mg 200 mL/hr over 60 Minutes Intravenous Every 36 hours 01/18/15 1259 01/18/15 1300   01/18/15 1400  piperacillin-tazobactam (ZOSYN) IVPB 3.375 g  Status:  Discontinued     3.375 g 12.5 mL/hr over 240 Minutes Intravenous 3 times per day 01/18/15 1259 01/19/15 1307   01/18/15 1400  vancomycin (VANCOCIN) IVPB 1000 mg/200 mL premix  Status:  Discontinued     1,000 mg 200 mL/hr over 60 Minutes Intravenous Every 36 hours 01/18/15 1300 01/19/15 1307   01/18/15 1330  vancomycin (VANCOCIN) IVPB 750 mg/150 ml premix  Status:  Discontinued     750 mg 150 mL/hr over 60 Minutes Intravenous  Once 01/18/15 1259 01/18/15 1300   01/17/15 1945  levofloxacin (LEVAQUIN) IVPB 750 mg     750 mg 100 mL/hr over 90 Minutes Intravenous  Once 01/17/15 1940 01/17/15 2155   01/17/15 1900  vancomycin (VANCOCIN) IVPB 1000 mg/200 mL premix     1,000 mg 200 mL/hr over 60 Minutes Intravenous  Once 01/17/15 1851 01/18/15 0140   01/17/15 1900  piperacillin-tazobactam (ZOSYN) IVPB 3.375 g     3.375 g 12.5 mL/hr over 240 Minutes Intravenous  Once 01/17/15 1851 01/17/15 1930       Assessment: Patient on coumadin for afib as an outpatient, most recently taking 5mg  daily. INR on admission: 1.75 Remains subtherapeutic  Goal of Therapy:  INR 2-3   Plan:  Given coumadin 5mg  x 5/13 today and  followed with 4mg  daily. Dose reduced from home dose due to concurrent antimicrobial treatment. Plan to check INR daily and d/c subq heparin when INR therapeutic.   Rexene Edison, PharmD  01/22/2015,11:19 AM

## 2015-01-22 NOTE — Progress Notes (Signed)
Patient ID: Jill Ellison, female   DOB: 1917-08-22, 81 y.o.   MRN: [id number] [facility] PROGRESS NOTE  Jill Ellison [id number] DOB: [birth year] DOA: 01/17/2015 PCP: Margarita Rana, MD  HPI/Subjective: Patient feels okay. Breathing okay. No cough. Eating well. Still little weak.  Objective: Filed Vitals:   01/22/15 1119  BP: 132/52  Pulse: 66  Temp: 98.4 F (36.9 C)  Resp: 16    Intake/Output Summary (Last 24 hours) at 01/22/15 1417 Last data filed at 01/22/15 1152  Gross per 24 hour  Intake  862.5 ml  Output    400 ml  Net  462.5 ml   Filed Weights   01/20/15 0534 01/21/15 0615 01/22/15 0451  Weight: 56.427 kg (124 lb 6.4 oz) 57.244 kg (126 lb 3.2 oz) 56.7 kg (125 lb)    ROS: Review of Systems  Constitutional: Negative for fever and chills.  Eyes: Negative for blurred vision.  Respiratory: Negative for cough and shortness of breath.   Cardiovascular: Negative for chest pain.  Gastrointestinal: Negative for nausea, vomiting, diarrhea and constipation.  Genitourinary: Negative for dysuria.  Musculoskeletal: Negative for joint pain.  Neurological: Negative for dizziness and headaches.   Exam: Physical Exam  HENT:  Nose: No mucosal edema.  Mouth/Throat: No oropharyngeal exudate or posterior oropharyngeal edema.  Eyes: Conjunctivae, EOM and lids are normal. Pupils are equal, round, and reactive to light.  Neck: No JVD present. Carotid bruit is not present. No edema present. No thyroid mass and no thyromegaly present.  Cardiovascular: S1 normal and S2 normal.  Exam reveals no gallop.   No murmur heard. Pulses:      Dorsalis pedis pulses are 2+ on the right side, and 2+ on the left side.  Respiratory: No respiratory distress. She has no wheezes. She has no rhonchi. She has no rales.  GI: Soft. Bowel sounds are normal. There is no tenderness.  Lymphadenopathy:    She has no cervical adenopathy.  Neurological: She is alert. No cranial nerve  deficit.  Skin: Skin is warm. No rash noted. Nails show no clubbing.  Psychiatric: She has a normal mood and affect.     Data Reviewed: Basic Metabolic Panel:  Recent Labs Lab 01/17/15 1850 01/18/15 0714 01/19/15 0413 01/21/15 0511 01/22/15 0515  NA 136 138 140 142 142  K 3.9 3.7 3.5 3.2* 3.2*  CL 96* 101 105 107 110  CO2 30 28 28 27 28   GLUCOSE 110* 92 92 101* 102*  BUN 29* 33* 37* 26* 26*  CREATININE 1.46* 1.66* 4.27* 0.62*  DUPLICATE REQUEST 3.76*  CALCIUM 8.3* 7.8* 8.4* 8.5* 8.8*   Liver Function Tests:  Recent Labs Lab 01/17/15 1850  AST 25  ALT 11*  ALKPHOS 47  BILITOT 0.7  PROT 8.2*  ALBUMIN 3.1*    Recent Labs Lab 01/18/15 0010  LIPASE 23   CBC:  Recent Labs Lab 01/17/15 1850 01/18/15 0714 01/19/15 0413 01/21/15 0511 01/22/15 0515  WBC 27.0* 24.9* 9.2 7.1 6.9  NEUTROABS 24.2*  --   --   --   --   HGB 10.7* 9.2* 8.8* 8.5* 9.0*  HCT 32.8* 28.9* 27.0* 26.6* 28.4*  MCV 95.1 96.0 96.0 97.5 97.1  PLT 247 283 151 761  DUPLICATE REQUEST 607   CBG:  Recent Labs Lab 01/17/15 2237  GLUCAP 110*    Recent Results (from the past 240 hour(s))  Culture, blood (routine x 2)     Status: None   Collection Time: 01/17/15  6:50  PM  Result Value Ref Range Status   Specimen Description BLOOD  Final   Special Requests Normal  Final   Culture  Setup Time GRAM POSITIVE COCCI IN PAIRS ANAEROBIC BOTTLE ONLY  Final   Culture   Final    STREPTOCOCCUS PNEUMONIAE BLOOD ANAEROBIC BOTTLE CRITICAL RESULT CALLED TO, READ BACK BY AND VERIFIED WITH: CTJ TO PAMELA ALLEN AT 7741 01/18/15    Report Status 01/22/2015 FINAL  Final   Organism ID, Bacteria STREPTOCOCCUS PNEUMONIAE  Final      Susceptibility   Streptococcus pneumoniae - MIC (ETEST)*    OXACILLIN DISK Value in next row       38SENSITIVE    CEFTRIAXONE Value in next row Sensitive      38SENSITIVE    PENICILLIN Value in next row Sensitive      38SENSITIVE    LEVOFLOXACIN Value in next row Sensitive       38SENSITIVE    VANCOMYCIN Value in next row Sensitive      38SENSITIVE    ERYTHROMYCIN Value in next row Sensitive      38SENSITIVE    * STREPTOCOCCUS PNEUMONIAE  Culture, blood (routine x 2)     Status: None   Collection Time: 01/17/15  6:50 PM  Result Value Ref Range Status   Specimen Description BLOOD  Final   Special Requests Normal  Final   Culture NO GROWTH 5 DAYS  Final   Report Status 01/22/2015 FINAL  Final  C difficile quick scan w PCR reflex ([facility])     Status: None   Collection Time: 01/20/15  8:17 PM  Result Value Ref Range Status   C Diff antigen POSITIVE  Corrected    Comment: CORRECTED ON 05/13 AT 0742: PREVIOUSLY REPORTED AS POSITIVE CRITICAL RESULT CALLED TO, READ BACK BY AND VERIFIED WITH: IRIS GUIDRY AT 0135 01/21/15. TSH   C Diff toxin NEGATIVE  Final   C Diff interpretation   Corrected    Negative for toxigenic C. difficile. Toxin gene and active toxin production not detected. May be a nontoxigenic strain of C. difficile bacteria present, lacking the ability to produce toxin.    Comment: CORRECTED RESULTS CALLED TO: CATHY SUMMERLIN AT 0740 01/21/15 LAB CORRECTED ON 05/13 AT 2878: PREVIOUSLY REPORTED AS POSITIVE   Clostridium Difficile by PCR     Status: None   Collection Time: 01/20/15  8:17 PM  Result Value Ref Range Status   C difficile by pcr Toxigenic C.diff NEGATIVE NEGATIVE Final  Culture, blood (routine x 2)     Status: None (Preliminary result)   Collection Time: 01/21/15 11:09 AM  Result Value Ref Range Status   Specimen Description BLOOD  Final   Special Requests Normal  Final   Culture NO GROWTH < 24 HOURS  Final   Report Status PENDING  Incomplete  Culture, blood (routine x 2)     Status: None (Preliminary result)   Collection Time: 01/21/15 11:17 AM  Result Value Ref Range Status   Specimen Description BLOOD  Final   Special Requests Normal  Final   Culture NO GROWTH < 24 HOURS  Final   Report Status PENDING  Incomplete      Studies: Dg Chest Port 1 View  01/21/2015   CLINICAL DATA:  Follow-up left upper lobe pneumonia  EXAM: PORTABLE CHEST - 1 VIEW  COMPARISON:  01/17/2015  FINDINGS: Cardiomediastinal silhouette is stable. Atherosclerotic calcifications of thoracic aorta again noted. Persistent left upper lobe pneumonia. Worsening in aeration in  left lower lobe suspicious for atelectasis or infiltrate. Probable small bilateral pleural effusion. No pulmonary edema.  IMPRESSION: Persistent left upper lobe pneumonia. Worsening in aeration in left lower lobe suspicious for atelectasis or infiltrate. Probable small bilateral pleural effusion. No pulmonary edema.   Electronically Signed   By: Lahoma Crocker M.D.   On: 01/21/2015 10:55    Scheduled Meds: . calcium-vitamin D  1 tablet Oral BID  . cefTRIAXone (ROCEPHIN)  IV  2 g Intravenous Q24H  . cholecalciferol  1,000 Units Oral Daily  . diltiazem  120 mg Oral Daily  . furosemide  20 mg Intravenous Once  . heparin  5,000 Units Subcutaneous 3 times per day  . ipratropium-albuterol  3 mL Nebulization Q4H  . letrozole  2.5 mg Oral Daily  . [START ON 01/23/2015] levofloxacin  500 mg Oral Q48H  . lisinopril  20 mg Oral Daily  . loratadine  10 mg Oral Daily  . magnesium oxide  800 mg Oral Daily  . multivitamin with minerals  1 tablet Oral Daily  . warfarin  4 mg Oral q1800   Continuous Infusions:   Assessment/Plan: Principal Problem:   Sepsis Active Problems:   Community acquired pneumonia   Atrial fibrillation with rapid ventricular response   1. Clinical sepsis, Streptococcus pneumoniae in blood culture on May 9, pneumonia left upper lobe and possibly lower lobe. The patient does not need triple antibiotics. I will discontinue vancomycin and Zosyn. The Streptococcus pneumonia is very sensitive to Rocephin. I will place the patient on Rocephin and continue the Levaquin. 2. Rapid atrial fibrillation patient is rate controlled on diltiazem and anticoagulated with  warfarin. 3. Acute respiratory failure with hypoxia patient currently on 3 L nasal cannula. Since the patient does not wear oxygen at home and would like to get the patient off oxygen prior to going home. I will DC IV fluid hydration and restart her Lasix. 4. History of breast cancer on letrozole. 5. Hypertension essential blood pressure stable on usual medications 6. Anemia likely dilutional with IV fluid hydration. I will stop IV fluid hydration.  Code Status:     Code Status Orders        Start     Ordered   01/17/15 2038  Full code   Continuous     01/17/15 2038      Disposition Plan: Hopefully home  Time spent: 30 minutes  Loletha Grayer  [facility]

## 2015-01-23 LAB — PROTIME-INR
INR: 2.7
Prothrombin Time: 28.8 seconds — ABNORMAL HIGH (ref 11.4–15.0)

## 2015-01-23 LAB — CREATININE, SERUM
Creatinine, Ser: 1.62 mg/dL — ABNORMAL HIGH (ref 0.44–1.00)
GFR calc Af Amer: 30 mL/min — ABNORMAL LOW (ref >60)
GFR calc non Af Amer: 26 mL/min — ABNORMAL LOW (ref >60)

## 2015-01-23 MED ORDER — WARFARIN - PHARMACIST DOSING INPATIENT
Freq: Every day | Status: DC
  Administered 2015-01-23 – 2015-01-27 (×5)

## 2015-01-23 MED ORDER — METOPROLOL TARTRATE 25 MG PO TABS
12.5000 mg | ORAL_TABLET | Freq: Two times a day (BID) | ORAL | Status: DC
  Administered 2015-01-23 – 2015-01-28 (×11): 12.5 mg via ORAL
  Filled 2015-01-23 (×11): qty 1

## 2015-01-23 MED ORDER — METOPROLOL TARTRATE 1 MG/ML IV SOLN
5.0000 mg | Freq: Once | INTRAVENOUS | Status: AC
  Administered 2015-01-23: 5 mg via INTRAVENOUS
  Filled 2015-01-23: qty 5

## 2015-01-23 MED ORDER — WARFARIN SODIUM 1 MG PO TABS
3.0000 mg | ORAL_TABLET | Freq: Every day | ORAL | Status: DC
  Administered 2015-01-23 – 2015-01-24 (×2): 3 mg via ORAL
  Filled 2015-01-23 (×2): qty 3

## 2015-01-23 NOTE — Progress Notes (Signed)
Patient ID: Jill Ellison, female   DOB: [birth year], 81 y.o.   MRN: [id number] [facility] PROGRESS NOTE  Jill Ellison VHQ:[id number] DOB: 1917-01-12 DOA: 01/17/2015 PCP: Jill Rana, MD  HPI/Subjective: Patient complains of nasal congestion. Some shortness of breath. Weakness. She still currently on 3 L nasal cannula and she doesn't wear oxygen at home.  Objective: Filed Vitals:   01/23/15 0434  BP: 128/43  Pulse: 97  Temp: 97.9 F (36.6 C)  Resp: 16    Intake/Output Summary (Last 24 hours) at 01/23/15 1131 Last data filed at 01/23/15 0900  Gross per 24 hour  Intake      0 ml  Output    600 ml  Net   -600 ml   Filed Weights   01/21/15 0615 01/22/15 0451 01/23/15 0434  Weight: 57.244 kg (126 lb 3.2 oz) 56.7 kg (125 lb) 54.568 kg (120 lb 4.8 oz)    ROS: Review of Systems  Constitutional: Negative for fever and chills.  HENT: Positive for congestion.   Eyes: Negative for blurred vision.  Respiratory: Positive for shortness of breath. Negative for cough.   Cardiovascular: Negative for chest pain.  Gastrointestinal: Negative for nausea, vomiting, diarrhea and constipation.  Genitourinary: Negative for dysuria.  Musculoskeletal: Negative for joint pain.  Neurological: Negative for dizziness and headaches.   Exam: Physical Exam  HENT:  Nose: No mucosal edema.  Mouth/Throat: No oropharyngeal exudate or posterior oropharyngeal edema.  Eyes: Conjunctivae, EOM and lids are normal. Pupils are equal, round, and reactive to light.  Neck: No JVD present. Carotid bruit is not present. No edema present. No thyroid mass and no thyromegaly present.  Cardiovascular: S1 normal and S2 normal.  An irregularly irregular rhythm present. Tachycardia present.  Exam reveals no gallop.   No murmur heard. Pulses:      Dorsalis pedis pulses are 2+ on the right side, and 2+ on the left side.  Respiratory: No respiratory distress. She has decreased breath sounds in the  right upper field, the right lower field, the left upper field, the left middle field and the left lower field. She has no wheezes. She has rhonchi in the right lower field and the left lower field. She has no rales.  GI: Soft. Bowel sounds are normal. There is no tenderness.  Lymphadenopathy:    She has no cervical adenopathy.  Neurological: She is alert. No cranial nerve deficit.  Skin: Skin is warm. No rash noted. Nails show no clubbing.  Psychiatric: She has a normal mood and affect.     Data Reviewed: Basic Metabolic Panel:  Recent Labs Lab 01/17/15 1850 01/18/15 0714 01/19/15 0413 01/21/15 0511 01/22/15 0515 01/23/15 0438  NA 136 138 140 142 142  --   K 3.9 3.7 3.5 3.2* 3.2*  --   CL 96* 101 105 107 110  --   CO2 30 28 28 27 28   --   GLUCOSE 110* 92 92 101* 102*  --   BUN 29* 33* 37* 26* 26*  --   CREATININE 1.46* 1.66* 4.13* 2.44*  DUPLICATE REQUEST 0.10* 1.62*  CALCIUM 8.3* 7.8* 8.4* 8.5* 8.8*  --     CBC:  Recent Labs Lab 01/17/15 1850 01/18/15 0714 01/19/15 0413 01/21/15 0511 01/22/15 0515  WBC 27.0* 24.9* 9.2 7.1 6.9  NEUTROABS 24.2*  --   --   --   --   HGB 10.7* 9.2* 8.8* 8.5* 9.0*  HCT 32.8* 28.9* 27.0* 26.6* 28.4*  MCV 95.1 96.0  96.0 97.5 97.1  PLT 247 035 009 381  DUPLICATE REQUEST 829     Scheduled Meds: . calcium-vitamin D  1 tablet Oral BID  . cefTRIAXone (ROCEPHIN)  IV  2 g Intravenous Q24H  . cholecalciferol  1,000 Units Oral Daily  . diltiazem  120 mg Oral Daily  . ipratropium-albuterol  3 mL Nebulization QID  . letrozole  2.5 mg Oral Daily  . levofloxacin  500 mg Oral Q48H  . lisinopril  20 mg Oral Daily  . loratadine  10 mg Oral Daily  . magnesium oxide  800 mg Oral Daily  . metoprolol  5 mg Intravenous Once  . metoprolol tartrate  12.5 mg Oral BID  . multivitamin with minerals  1 tablet Oral Daily  . warfarin  3 mg Oral q1800  . Warfarin - Pharmacist Dosing Inpatient   Does not apply q1800    Assessment/Plan: Principal  Problem:   Sepsis Active Problems:   Community acquired pneumonia   Atrial fibrillation with rapid ventricular response   1. Clinical sepsis, Streptococcus pneumoniae in blood culture on May 9, pneumonia left upper lobe and possibly lower lobe. The Streptococcus pneumonia is very sensitive to Rocephin. I will place the patient on Rocephin and continue the Levaquin. 2. Rapid atrial fibrillation again this morning. I will give 5 mg IV metoprolol and restart 12.5 mg metoprolol twice a day. Continue diltiazem and anticoagulated with warfarin (pharmacy dosing). 3. Acute respiratory failure with hypoxia patient currently on 3 L nasal cannula. I took the patient off oxygen this morning and pulse ox was 70% on room air. The patient does not wear oxygen at home. I will continue antibiotics for pneumonia. Patient was given 1 dose of Lasix yesterday. 4. Acute renal failure on chronic kidney disease. Continue to monitor creatinine daily. Vancomycin was already DC'd yesterday. Will hold on Lasix today. 5. History of breast cancer on letrozole. 6. Hypertension essential blood pressure stable on usual medications 7. Anemia likely dilutional with IV fluid hydration.  Code Status:     Code Status Orders        Start     Ordered   01/17/15 2038  Full code   Continuous     01/17/15 2038     Family discussion:  Case discussed with great grandson at the bedside. Disposition Plan: Hopefully home  Time spent: 22 minutes  Jill Ellison  [facility]

## 2015-01-23 NOTE — Progress Notes (Signed)
[place] for Coumadin Indication: atrial fibrillation  No Known Allergies  Patient Measurements: Height: 5\' 1"  (154.9 cm) Weight: 120 lb 4.8 oz (54.568 kg) IBW/kg (Calculated) : 47.8  Vital Signs: Temp: 97.9 F (36.6 C) (05/15 0434) Temp Source: Oral (05/15 0434) BP: 128/43 mmHg (05/15 0434) Pulse Rate: 97 (05/15 0434)  Labs:  Recent Labs  01/21/15 0511 01/22/15 0515 01/23/15 0438  HGB 8.5* 9.0*  --   HCT 26.6* 28.4*  --   PLT 542  DUPLICATE REQUEST 706  --   LABPROT 18.8* 21.0* 28.8*  INR 1.55 1.79 2.70  CREATININE 2.37*  DUPLICATE REQUEST 6.28* 1.62*     Medical History: Past Medical History  Diagnosis Date  . Swelling of ankle   . SOB (shortness of breath)   . Hypertension   . Asthma   . Arthritis   . Atrial fibrillation 2011  . Cancer 2011    Left breast  . Cancer of leg   . Malignant neoplasm of upper-outer quadrant of female breast     left   . Memory loss   . Dementia   . GERD (gastroesophageal reflux disease)   . Hyperlipidemia   . Anemia   . Iron deficiency   . History of colonic polyps   . Lumbago     Medications:  Prescriptions prior to admission  Medication Sig Dispense Refill Last Dose  . calcium-vitamin D (OSCAL WITH D) 500-200 MG-UNIT per tablet Take 1 tablet by mouth 2 (two) times daily.     Past Month at Unknown time  . Cholecalciferol (VITAMIN D3) 1000 UNITS CAPS Take 1 capsule by mouth daily.     Past Month at Unknown time  . diltiazem (CARDIZEM CD) 120 MG 24 hr capsule Take 1 capsule (120 mg total) by mouth daily. 30 capsule 11 01/16/2015 at Unknown time  . furosemide (LASIX) 20 MG tablet TAKE 1 TABLET EVERY DAY 30 tablet 6 01/16/2015 at Unknown time  . KLOR-CON M20 20 MEQ tablet TAKE 1 TABLET BY MOUTH EVERY DAY 30 tablet 3 01/16/2015 at Unknown time  . letrozole (FEMARA) 2.5 MG tablet Take 2.5 mg by mouth daily.     01/16/2015 at Unknown time  . lisinopril (PRINIVIL,ZESTRIL) 20 MG tablet TAKE 1  TABLET BY MOUTH DAILY 30 tablet 6 01/16/2015 at Unknown time  . Magnesium 250 MG TABS Take 2 tablets by mouth daily.    Past Month at Unknown time  . metoprolol succinate (TOPROL-XL) 25 MG 24 hr tablet Take 25 mg by mouth daily.   01/16/2015 at 1700  . Multiple Vitamin (MULTIVITAMIN) tablet Take 1 tablet by mouth daily.     Past Month at Unknown time  . warfarin (COUMADIN) 5 MG tablet Take 5 mg by mouth daily.   01/16/2015 at Unknown time  . fexofenadine (ALLEGRA) 180 MG tablet Take 180 mg by mouth daily as needed for allergies.    prn  . levalbuterol (XOPENEX HFA) 45 MCG/ACT inhaler Inhale 1-2 puffs into the lungs every 4 (four) hours as needed.     rescue  . PROAIR HFA 108 (90 BASE) MCG/ACT inhaler Inhale 2 puffs into the lungs every 6 (six) hours as needed for wheezing or shortness of breath.   2 rescue  . warfarin (COUMADIN) 2 MG tablet TAKE AS DIRECTED BY ANTICOAGULATION CLINIC (Patient not taking: Reported on 01/17/2015) 75 tablet 3 Taking   Scheduled:  . calcium-vitamin D  1 tablet Oral BID  . cefTRIAXone (ROCEPHIN)  IV  2 g Intravenous Q24H  . cholecalciferol  1,000 Units Oral Daily  . diltiazem  120 mg Oral Daily  . ipratropium-albuterol  3 mL Nebulization QID  . letrozole  2.5 mg Oral Daily  . levofloxacin  500 mg Oral Q48H  . lisinopril  20 mg Oral Daily  . loratadine  10 mg Oral Daily  . magnesium oxide  800 mg Oral Daily  . multivitamin with minerals  1 tablet Oral Daily  . warfarin  4 mg Oral q1800   Infusions:    PRN: acetaminophen **OR** acetaminophen, albuterol, ondansetron **OR** ondansetron (ZOFRAN) IV Anti-infectives    Start     Dose/Rate Route Frequency Ordered Stop   01/23/15 0900  levofloxacin (LEVAQUIN) tablet 750 mg  Status:  Discontinued     750 mg Oral Every 48 hours 01/21/15 1028 01/22/15 1130   01/23/15 0900  levofloxacin (LEVAQUIN) tablet 500 mg     500 mg Oral Every 48 hours 01/22/15 1130     01/22/15 1800  piperacillin-tazobactam (ZOSYN) IVPB 3.375 g  Status:   Discontinued     3.375 g 12.5 mL/hr over 240 Minutes Intravenous Every 12 hours 01/22/15 1130 01/22/15 1414   01/22/15 1430  cefTRIAXone (ROCEPHIN) 2 g in dextrose 5 % 50 mL IVPB - Premix     2 g 100 mL/hr over 30 Minutes Intravenous Every 24 hours 01/22/15 1415     01/22/15 1400  vancomycin (VANCOCIN) IVPB 750 mg/150 ml premix  Status:  Discontinued     750 mg 150 mL/hr over 60 Minutes Intravenous Every 36 hours 01/21/15 1438 01/22/15 1414   01/21/15 1400  piperacillin-tazobactam (ZOSYN) IVPB 3.375 g  Status:  Discontinued     3.375 g 12.5 mL/hr over 240 Minutes Intravenous 3 times per day 01/21/15 1152 01/22/15 1130   01/21/15 1300  vancomycin (VANCOCIN) IVPB 1000 mg/200 mL premix     1,000 mg 200 mL/hr over 60 Minutes Intravenous  Once 01/21/15 1152 01/21/15 1500   01/21/15 1000  levofloxacin (LEVAQUIN) IVPB 500 mg  Status:  Discontinued     500 mg 100 mL/hr over 60 Minutes Intravenous Every 48 hours 01/20/15 1530 01/21/15 1028   01/19/15 1800  levofloxacin (LEVAQUIN) IVPB 750 mg  Status:  Discontinued     750 mg 100 mL/hr over 90 Minutes Intravenous Every 48 hours 01/17/15 2249 01/20/15 1530   01/19/15 0200  vancomycin (VANCOCIN) IVPB 1000 mg/200 mL premix  Status:  Discontinued     1,000 mg 200 mL/hr over 60 Minutes Intravenous Every 36 hours 01/18/15 1259 01/18/15 1300   01/18/15 1400  piperacillin-tazobactam (ZOSYN) IVPB 3.375 g  Status:  Discontinued     3.375 g 12.5 mL/hr over 240 Minutes Intravenous 3 times per day 01/18/15 1259 01/19/15 1307   01/18/15 1400  vancomycin (VANCOCIN) IVPB 1000 mg/200 mL premix  Status:  Discontinued     1,000 mg 200 mL/hr over 60 Minutes Intravenous Every 36 hours 01/18/15 1300 01/19/15 1307   01/18/15 1330  vancomycin (VANCOCIN) IVPB 750 mg/150 ml premix  Status:  Discontinued     750 mg 150 mL/hr over 60 Minutes Intravenous  Once 01/18/15 1259 01/18/15 1300   01/17/15 1945  levofloxacin (LEVAQUIN) IVPB 750 mg     750 mg 100 mL/hr over 90  Minutes Intravenous  Once 01/17/15 1940 01/17/15 2155   01/17/15 1900  vancomycin (VANCOCIN) IVPB 1000 mg/200 mL premix     1,000 mg 200 mL/hr over 60 Minutes Intravenous  Once 01/17/15 1851  01/18/15 0140   01/17/15 1900  piperacillin-tazobactam (ZOSYN) IVPB 3.375 g     3.375 g 12.5 mL/hr over 240 Minutes Intravenous  Once 01/17/15 1851 01/17/15 1930     Patient on coumadin for afib as an outpatient, most recently taking 5mg  daily.   INR Coumadin Dose  5/9 1.67 -  5/10  2mg   5/11 1.75 2mg   5/12 1.40 2mg   5/13 1.55 5mg   5/14 1.79 4mg   5/15 2.70         Assessment: Therapeutic  Goal of Therapy:  INR 2-3   Plan:  Will d/c subQ heparin at this time and reduce coumadin dose to 3mg  daily. Recheck INR with AM labs  Pharmacy to follow per consult  Rexene Edison, PharmD  01/23/2015,8:00 AM

## 2015-01-24 ENCOUNTER — Inpatient Hospital Stay: Payer: Medicare Other

## 2015-01-24 LAB — BASIC METABOLIC PANEL
Anion gap: 4 — ABNORMAL LOW (ref 5–15)
BUN: 27 mg/dL — AB (ref 6–20)
CO2: 30 mmol/L (ref 22–32)
Calcium: 9.4 mg/dL (ref 8.9–10.3)
Chloride: 107 mmol/L (ref 101–111)
Creatinine, Ser: 1.54 mg/dL — ABNORMAL HIGH (ref 0.44–1.00)
GFR calc Af Amer: 32 mL/min — ABNORMAL LOW (ref >60)
GFR calc non Af Amer: 27 mL/min — ABNORMAL LOW (ref >60)
GLUCOSE: 99 mg/dL (ref 65–99)
POTASSIUM: 2.8 mmol/L — AB (ref 3.5–5.1)
SODIUM: 141 mmol/L (ref 135–145)

## 2015-01-24 LAB — PROTIME-INR
INR: 2.45
PROTHROMBIN TIME: 26.7 s — AB (ref 11.4–15.0)

## 2015-01-24 MED ORDER — FLUTICASONE PROPIONATE 50 MCG/ACT NA SUSP
2.0000 | Freq: Every day | NASAL | Status: DC
  Administered 2015-01-24 – 2015-01-27 (×4): 2 via NASAL
  Filled 2015-01-24: qty 16

## 2015-01-24 MED ORDER — POTASSIUM CHLORIDE CRYS ER 20 MEQ PO TBCR
40.0000 meq | EXTENDED_RELEASE_TABLET | Freq: Once | ORAL | Status: AC
  Administered 2015-01-24: 40 meq via ORAL
  Filled 2015-01-24: qty 2

## 2015-01-24 MED ORDER — SODIUM CHLORIDE 0.9 % IJ SOLN: 3.0000 mL | INTRAMUSCULAR | Status: DC | PRN

## 2015-01-24 MED ORDER — METHYLPREDNISOLONE SODIUM SUCC 40 MG IJ SOLR
40.0000 mg | Freq: Four times a day (QID) | INTRAMUSCULAR | Status: DC
  Administered 2015-01-24 – 2015-01-26 (×8): 40 mg via INTRAVENOUS
  Filled 2015-01-24 (×8): qty 1

## 2015-01-24 MED ORDER — FUROSEMIDE 10 MG/ML IJ SOLN
40.0000 mg | Freq: Once | INTRAMUSCULAR | Status: AC
  Administered 2015-01-24: 40 mg via INTRAVENOUS
  Filled 2015-01-24: qty 4

## 2015-01-24 MED ORDER — POTASSIUM CHLORIDE CRYS ER 20 MEQ PO TBCR
40.0000 meq | EXTENDED_RELEASE_TABLET | Freq: Two times a day (BID) | ORAL | Status: DC
  Administered 2015-01-24 – 2015-01-26 (×5): 40 meq via ORAL
  Filled 2015-01-24 (×5): qty 2

## 2015-01-24 NOTE — Progress Notes (Signed)
Patient ID: Jill Ellison, female   DOB: August 30, 1917, 81 y.o.   MRN: [id number] [facility] PROGRESS NOTE  Jill Ellison [id number] DOB: 1917/05/01 DOA: 01/17/2015 PCP: Margarita Rana, MD  HPI/Subjective: The patient complains that she cannot breathe. She is breathing through her mouth because her nose is congested. No cough.  Objective: Filed Vitals:   01/24/15 1126  BP: 143/83  Pulse: 91  Temp: 97.8 F (36.6 C)  Resp: 16    Intake/Output Summary (Last 24 hours) at 01/24/15 1227 Last data filed at 01/24/15 1135  Gross per 24 hour  Intake    100 ml  Output    850 ml  Net   -750 ml   Filed Weights   01/22/15 0451 01/23/15 0434 01/24/15 0408  Weight: 56.7 kg (125 lb) 54.568 kg (120 lb 4.8 oz) 58.287 kg (128 lb 8 oz)    ROS: Review of Systems  Constitutional: Negative for fever and chills.  Eyes: Negative for blurred vision.  Respiratory: Positive for shortness of breath. Negative for cough.   Cardiovascular: Negative for chest pain.  Gastrointestinal: Negative for nausea, vomiting, abdominal pain, diarrhea and constipation.  Genitourinary: Negative for dysuria.  Musculoskeletal: Negative for joint pain.  Neurological: Negative for dizziness and headaches.   Exam: Physical Exam  HENT:  Nose: No mucosal edema.  Mouth/Throat: No oropharyngeal exudate or posterior oropharyngeal edema.  Eyes: Conjunctivae, EOM and lids are normal. Pupils are equal, round, and reactive to light.  Neck: No JVD present. Carotid bruit is not present. No edema present. No thyroid mass and no thyromegaly present.  Cardiovascular: S1 normal and S2 normal.  An irregularly irregular rhythm present. Exam reveals no gallop.   No murmur heard. Pulses:      Dorsalis pedis pulses are 2+ on the right side, and 2+ on the left side.  Respiratory: No respiratory distress. She has decreased breath sounds in the right upper field, the right lower field, the left upper field, the left  middle field and the left lower field. She has no wheezes. She has rhonchi in the right lower field and the left lower field. She has no rales.  GI: Soft. Bowel sounds are normal. There is no tenderness.  Lymphadenopathy:    She has no cervical adenopathy.  Neurological: She is alert. No cranial nerve deficit.  Skin: Skin is warm. No rash noted. Nails show no clubbing.  Psychiatric: She has a normal mood and affect.     Data Reviewed: Basic Metabolic Panel:  Recent Labs Lab 01/18/15 0714 01/19/15 0413 01/21/15 0511 01/22/15 0515 01/23/15 0438 01/24/15 0441  NA 138 140 142 142  --  141  K 3.7 3.5 3.2* 3.2*  --  2.8*  CL 101 105 107 110  --  107  CO2 28 28 27 28   --  30  GLUCOSE 92 92 101* 102*  --  99  BUN 33* 37* 26* 26*  --  27*  CREATININE 1.66* 5.17* 0.01*  DUPLICATE REQUEST 7.49* 1.62* 1.54*  CALCIUM 7.8* 8.4* 8.5* 8.8*  --  9.4    CBC:  Recent Labs Lab 01/17/15 1850 01/18/15 0714 01/19/15 0413 01/21/15 0511 01/22/15 0515  WBC 27.0* 24.9* 9.2 7.1 6.9  NEUTROABS 24.2*  --   --   --   --   HGB 10.7* 9.2* 8.8* 8.5* 9.0*  HCT 32.8* 28.9* 27.0* 26.6* 28.4*  MCV 95.1 96.0 96.0 97.5 97.1  PLT 247 449 675 916  DUPLICATE REQUEST 384  Scheduled Meds: . calcium-vitamin D  1 tablet Oral BID  . cefTRIAXone (ROCEPHIN)  IV  2 g Intravenous Q24H  . cholecalciferol  1,000 Units Oral Daily  . diltiazem  120 mg Oral Daily  . fluticasone  2 spray Each Nare Daily  . furosemide  40 mg Intravenous Once  . ipratropium-albuterol  3 mL Nebulization QID  . letrozole  2.5 mg Oral Daily  . levofloxacin  500 mg Oral Q48H  . lisinopril  20 mg Oral Daily  . loratadine  10 mg Oral Daily  . magnesium oxide  800 mg Oral Daily  . methylPREDNISolone (SOLU-MEDROL) injection  40 mg Intravenous Q6H  . metoprolol tartrate  12.5 mg Oral BID  . multivitamin with minerals  1 tablet Oral Daily  . potassium chloride  40 mEq Oral BID  . potassium chloride  40 mEq Oral Once  . warfarin  3  mg Oral q1800  . Warfarin - Pharmacist Dosing Inpatient   Does not apply q1800    Assessment/Plan: Principal Problem:   Sepsis Active Problems:   Community acquired pneumonia   Atrial fibrillation with rapid ventricular response   1. Clinical sepsis, Streptococcus pneumoniae in blood culture on May 9, pneumonia left upper lobe and possibly lower lobe. The Streptococcus pneumonia continue Rocephin and Levaquin. 2. Rapid atrial fibrillation again this morning. Heart rate better controlled today on Cardizem and metoprolol. 3. Acute respiratory failure with hypoxia patient currently on 5 L nasal cannula. I turned the patient's oxygen down to 3 L. I will give 1 dose of Lasix 40 mg IV 1. Add Solu-Medrol. Repeat chest x-ray. Pulmonary consultation. Flonase nasal spray added. 4. Acute renal failure on chronic kidney disease. Creatinine slightly better today. 5. Hypokalemia: Will replace potassium 3 times today and twice a day tomorrow. Recheck in a.m. 6. History of breast cancer on letrozole. 7. Hypertension essential blood pressure stable on usual medications 8. Anemia likely dilutional with IV fluid hydration.  Code Status:     Code Status Orders        Start     Ordered   01/17/15 2038  Full code   Continuous     01/17/15 2038     Family discussion:  Left message for her granddaughter on phone. Disposition Plan: Hopefully home  Time spent: 25 minutes minutes, case discussed with care manager team.  Loletha Grayer  [facility]

## 2015-01-24 NOTE — Progress Notes (Signed)
Pt alert and oriented although extremely hard of hearing. VSS. No complaints of pain. A fibb per monitor. Pt 2 person assist to bedside commode. Alert lab value called this am- K 2.8, Dr Reece Levy notified. No further complaints. Continue to monitor.

## 2015-01-24 NOTE — Progress Notes (Signed)
Patient resting in bed at this time still sob on exertion , denies pain, family at bedside , iv lasix ivp given as cxr shows slight increase in chf , , assist with hygiene care , care on going

## 2015-01-24 NOTE — Plan of Care (Signed)
Problem: Phase III Progression Outcomes Goal: O2 sats > or equal to 93% on room air Outcome: Not Met (add Reason) Pt with increased o2 demands this shift

## 2015-01-24 NOTE — Progress Notes (Signed)
[place] for Coumadin Indication: atrial fibrillation  No Known Allergies  Patient Measurements: Height: 5\' 1"  (154.9 cm) Weight: 128 lb 8 oz (58.287 kg) IBW/kg (Calculated) : 47.8  Vital Signs: Temp: 98.1 F (36.7 C) (05/16 0819) Temp Source: Oral (05/16 0819) BP: 140/84 mmHg (05/16 0819) Pulse Rate: 119 (05/16 0819)  Labs:  Recent Labs  01/22/15 0515 01/23/15 0438 01/24/15 0441  HGB 9.0*  --   --   HCT 28.4*  --   --   PLT 171  --   --   LABPROT 21.0* 28.8* 26.7*  INR 1.79 2.70 2.45  CREATININE 1.30* 1.62* 1.54*     Medical History: Past Medical History  Diagnosis Date  . Swelling of ankle   . SOB (shortness of breath)   . Hypertension   . Asthma   . Arthritis   . Atrial fibrillation 2011  . Cancer 2011    Left breast  . Cancer of leg   . Malignant neoplasm of upper-outer quadrant of female breast     left   . Memory loss   . Dementia   . GERD (gastroesophageal reflux disease)   . Hyperlipidemia   . Anemia   . Iron deficiency   . History of colonic polyps   . Lumbago     Medications:  Prescriptions prior to admission  Medication Sig Dispense Refill Last Dose  . calcium-vitamin D (OSCAL WITH D) 500-200 MG-UNIT per tablet Take 1 tablet by mouth 2 (two) times daily.     Past Month at Unknown time  . Cholecalciferol (VITAMIN D3) 1000 UNITS CAPS Take 1 capsule by mouth daily.     Past Month at Unknown time  . diltiazem (CARDIZEM CD) 120 MG 24 hr capsule Take 1 capsule (120 mg total) by mouth daily. 30 capsule 11 01/16/2015 at Unknown time  . furosemide (LASIX) 20 MG tablet TAKE 1 TABLET EVERY DAY 30 tablet 6 01/16/2015 at Unknown time  . KLOR-CON M20 20 MEQ tablet TAKE 1 TABLET BY MOUTH EVERY DAY 30 tablet 3 01/16/2015 at Unknown time  . letrozole (FEMARA) 2.5 MG tablet Take 2.5 mg by mouth daily.     01/16/2015 at Unknown time  . lisinopril (PRINIVIL,ZESTRIL) 20 MG tablet TAKE 1 TABLET BY MOUTH DAILY 30 tablet 6 01/16/2015 at  Unknown time  . Magnesium 250 MG TABS Take 2 tablets by mouth daily.    Past Month at Unknown time  . metoprolol succinate (TOPROL-XL) 25 MG 24 hr tablet Take 25 mg by mouth daily.   01/16/2015 at 1700  . Multiple Vitamin (MULTIVITAMIN) tablet Take 1 tablet by mouth daily.     Past Month at Unknown time  . warfarin (COUMADIN) 5 MG tablet Take 5 mg by mouth daily.   01/16/2015 at Unknown time  . fexofenadine (ALLEGRA) 180 MG tablet Take 180 mg by mouth daily as needed for allergies.    prn  . levalbuterol (XOPENEX HFA) 45 MCG/ACT inhaler Inhale 1-2 puffs into the lungs every 4 (four) hours as needed.     rescue  . PROAIR HFA 108 (90 BASE) MCG/ACT inhaler Inhale 2 puffs into the lungs every 6 (six) hours as needed for wheezing or shortness of breath.   2 rescue  . warfarin (COUMADIN) 2 MG tablet TAKE AS DIRECTED BY ANTICOAGULATION CLINIC (Patient not taking: Reported on 01/17/2015) 75 tablet 3 Taking   Scheduled:  . calcium-vitamin D  1 tablet Oral BID  . cefTRIAXone (ROCEPHIN)  IV  2 g Intravenous Q24H  . cholecalciferol  1,000 Units Oral Daily  . diltiazem  120 mg Oral Daily  . ipratropium-albuterol  3 mL Nebulization QID  . letrozole  2.5 mg Oral Daily  . levofloxacin  500 mg Oral Q48H  . lisinopril  20 mg Oral Daily  . loratadine  10 mg Oral Daily  . magnesium oxide  800 mg Oral Daily  . metoprolol tartrate  12.5 mg Oral BID  . multivitamin with minerals  1 tablet Oral Daily  . potassium chloride  40 mEq Oral BID  . warfarin  3 mg Oral q1800  . Warfarin - Pharmacist Dosing Inpatient   Does not apply q1800   Infusions:    PRN: acetaminophen **OR** acetaminophen, albuterol, ondansetron **OR** ondansetron (ZOFRAN) IV, sodium chloride Anti-infectives    Start     Dose/Rate Route Frequency Ordered Stop   01/23/15 0900  levofloxacin (LEVAQUIN) tablet 750 mg  Status:  Discontinued     750 mg Oral Every 48 hours 01/21/15 1028 01/22/15 1130   01/23/15 0900  levofloxacin (LEVAQUIN) tablet 500 mg      500 mg Oral Every 48 hours 01/22/15 1130     01/22/15 1800  piperacillin-tazobactam (ZOSYN) IVPB 3.375 g  Status:  Discontinued     3.375 g 12.5 mL/hr over 240 Minutes Intravenous Every 12 hours 01/22/15 1130 01/22/15 1414   01/22/15 1430  cefTRIAXone (ROCEPHIN) 2 g in dextrose 5 % 50 mL IVPB - Premix     2 g 100 mL/hr over 30 Minutes Intravenous Every 24 hours 01/22/15 1415     01/22/15 1400  vancomycin (VANCOCIN) IVPB 750 mg/150 ml premix  Status:  Discontinued     750 mg 150 mL/hr over 60 Minutes Intravenous Every 36 hours 01/21/15 1438 01/22/15 1414   01/21/15 1400  piperacillin-tazobactam (ZOSYN) IVPB 3.375 g  Status:  Discontinued     3.375 g 12.5 mL/hr over 240 Minutes Intravenous 3 times per day 01/21/15 1152 01/22/15 1130   01/21/15 1300  vancomycin (VANCOCIN) IVPB 1000 mg/200 mL premix     1,000 mg 200 mL/hr over 60 Minutes Intravenous  Once 01/21/15 1152 01/21/15 1500   01/21/15 1000  levofloxacin (LEVAQUIN) IVPB 500 mg  Status:  Discontinued     500 mg 100 mL/hr over 60 Minutes Intravenous Every 48 hours 01/20/15 1530 01/21/15 1028   01/19/15 1800  levofloxacin (LEVAQUIN) IVPB 750 mg  Status:  Discontinued     750 mg 100 mL/hr over 90 Minutes Intravenous Every 48 hours 01/17/15 2249 01/20/15 1530   01/19/15 0200  vancomycin (VANCOCIN) IVPB 1000 mg/200 mL premix  Status:  Discontinued     1,000 mg 200 mL/hr over 60 Minutes Intravenous Every 36 hours 01/18/15 1259 01/18/15 1300   01/18/15 1400  piperacillin-tazobactam (ZOSYN) IVPB 3.375 g  Status:  Discontinued     3.375 g 12.5 mL/hr over 240 Minutes Intravenous 3 times per day 01/18/15 1259 01/19/15 1307   01/18/15 1400  vancomycin (VANCOCIN) IVPB 1000 mg/200 mL premix  Status:  Discontinued     1,000 mg 200 mL/hr over 60 Minutes Intravenous Every 36 hours 01/18/15 1300 01/19/15 1307   01/18/15 1330  vancomycin (VANCOCIN) IVPB 750 mg/150 ml premix  Status:  Discontinued     750 mg 150 mL/hr over 60 Minutes Intravenous   Once 01/18/15 1259 01/18/15 1300   01/17/15 1945  levofloxacin (LEVAQUIN) IVPB 750 mg     750 mg 100 mL/hr over 90 Minutes Intravenous  Once 01/17/15 1940 01/17/15 2155   01/17/15 1900  vancomycin (VANCOCIN) IVPB 1000 mg/200 mL premix     1,000 mg 200 mL/hr over 60 Minutes Intravenous  Once 01/17/15 1851 01/18/15 0140   01/17/15 1900  piperacillin-tazobactam (ZOSYN) IVPB 3.375 g     3.375 g 12.5 mL/hr over 240 Minutes Intravenous  Once 01/17/15 1851 01/17/15 1930     Patient on coumadin for afib as an outpatient, most recently taking 5mg  daily.   INR Coumadin Dose  5/9 1.67 -  5/10  2mg   5/11 1.75 2mg   5/12 1.40 2mg   5/13 1.55 5mg   5/14 1.79 4mg   5/15 2.70 3 mg   5/16 2.45     Assessment: Therapeutic  Goal of Therapy:  INR 2-3   Plan:  Will continue 3 mg of coumadin daily. Recheck INR with AM labs  Pharmacy to follow per consult  Larene Beach, PharmD   01/24/2015,9:19 AM

## 2015-01-24 NOTE — Progress Notes (Signed)
Physical Therapy Treatment Patient Details Name: Jill Ellison MRN: [id number] DOB: 10-Dec-1916 Today's Date: 01/24/2015    History of Present Illness Pt is a pleasant 81 year old female who was admitted for sepsis and pnemonia. Pt with complaints of fever and SOB and is now on 5L of O2.    PT Comments    Pt is making limited progress towards goals and is limited secondary to fatigue and poor endurance. Pt with low K+, however currently being replaced and pt cleared for mobility per RN. Pt on 5L of O2 at rest, however required 6L of O2 for [facility] transfer with O2 sats at 90%. HR ~137bpm with movement. Pt able to ambulate to [facility] with +2 assist and then to recliner. Total assist required for hygiene during toileting. Pt very fatigued, unable to participate in further therex.  RN notified.  Follow Up Recommendations  SNF     Equipment Recommendations  Rolling walker with 5" wheels;Wheelchair (measurements PT)    Recommendations for Other Services       Precautions / Restrictions Precautions Precautions: Fall Restrictions Weight Bearing Restrictions: No    Mobility  Bed Mobility Overal bed mobility: Needs Assistance Bed Mobility: Supine to Sit           General bed mobility comments: supine->sit with min assist. Once seated at EOB, pt requires heavy cues for scooting out towards EOB.  Transfers Overall transfer level: Needs assistance Equipment used: Rolling walker (2 wheeled) Transfers: Sit to/from Stand           General transfer comment: sit<>Stand with mod assist + 2 and rw. Pt with forward flexed posture and requires heavy cues for correct use of rw.  Ambulation/Gait Ambulation/Gait assistance: Mod assist Ambulation Distance (Feet): 5 Feet Assistive device: Rolling walker (2 wheeled)     Gait velocity interpretation: Below normal speed for age/gender General Gait Details: few steps taken with mod assist + 2 and rw. Multiple LOB in posterior direction noted  while ambulating towards recliner with heavy assist for correction. Limited endurance noted, unable to further ambulate. All mobility performed on 6L of O2 with sats at 90%. Slow step to gait pattern performed.   Stairs            Wheelchair Mobility    Modified Rankin (Stroke Patients Only)       Balance                                    Cognition Arousal/Alertness: Lethargic Behavior During Therapy: WFL for tasks assessed/performed Overall Cognitive Status: Within Functional Limits for tasks assessed                      Exercises Other Exercises Other Exercises: unable to tolerate ther-ex this date as pt very lethargic and falling asleep     General Comments        Pertinent Vitals/Pain Pain Assessment: No/denies pain    Home Living                      Prior Function            PT Goals (current goals can now be found in the care plan section) Acute Rehab PT Goals Patient Stated Goal: to get stronger PT Goal Formulation: With patient Time For Goal Achievement: 02/02/15 Potential to Achieve Goals: Good Progress towards PT goals: Progressing toward goals  Frequency  Min 2X/week    PT Plan Current plan remains appropriate    Co-evaluation             End of Session Equipment Utilized During Treatment: Gait belt;Oxygen Activity Tolerance: Patient limited by fatigue Patient left: in chair;with chair alarm set     Time: 1545-1610 PT Time Calculation (min) (ACUTE ONLY): 25 min  Charges:  $Therapeutic Activity: 23-37 mins                    G Codes:     Greggory Stallion, PT, DPT [phone]  Vernell Townley 01/24/2015, 4:38 PM

## 2015-01-24 NOTE — Plan of Care (Signed)
Problem: Phase III Progression Outcomes Goal: Tolerating diet Outcome: Progressing Lack of appetite, needs improvement  Problem: Discharge Progression Outcomes Goal: O2 sats at patient's baseline Outcome: Not Met (add Reason) On 4 L [place], does not wear O2 at home

## 2015-01-25 DIAGNOSIS — E46 Unspecified protein-calorie malnutrition: Secondary | ICD-10-CM

## 2015-01-25 LAB — BASIC METABOLIC PANEL
Anion gap: 6 (ref 5–15)
BUN: 34 mg/dL — ABNORMAL HIGH (ref 6–20)
CALCIUM: 9.3 mg/dL (ref 8.9–10.3)
CHLORIDE: 102 mmol/L (ref 101–111)
CO2: 33 mmol/L — ABNORMAL HIGH (ref 22–32)
Creatinine, Ser: 1.48 mg/dL — ABNORMAL HIGH (ref 0.44–1.00)
GFR calc Af Amer: 33 mL/min — ABNORMAL LOW (ref >60)
GFR calc non Af Amer: 29 mL/min — ABNORMAL LOW (ref >60)
GLUCOSE: 159 mg/dL — AB (ref 65–99)
Potassium: 4.4 mmol/L (ref 3.5–5.1)
SODIUM: 141 mmol/L (ref 135–145)

## 2015-01-25 LAB — PROTIME-INR
INR: 2.27
Prothrombin Time: 25.2 seconds — ABNORMAL HIGH (ref 11.4–15.0)

## 2015-01-25 MED ORDER — FUROSEMIDE 10 MG/ML IJ SOLN
40.0000 mg | Freq: Once | INTRAMUSCULAR | Status: AC
  Administered 2015-01-25: 40 mg via INTRAVENOUS
  Filled 2015-01-25: qty 4

## 2015-01-25 MED ORDER — WARFARIN SODIUM 1 MG PO TABS
4.0000 mg | ORAL_TABLET | Freq: Every day | ORAL | Status: DC
  Administered 2015-01-25: 4 mg via ORAL
  Filled 2015-01-25: qty 4

## 2015-01-25 NOTE — Consult Note (Signed)
Palliative Medicine Inpatient Consult Follow Up Note   Name: Jill Ellison Date: 01/25/2015 MRN: [id number]  DOB: August 18, 1917  Referring Physician: Loletha Grayer, MD  Palliative Care consult requested for this 81 y.o. female for goals of medical therapy in patient with dementia, a.fib, diastolic dysfunction, admitted with pneumonia & rapid a.fib  Jill Ellison is sitting up in chair. Says "I just don't feel good" but can't explain further. Family not present.    REVIEW OF SYSTEMS:  Pain: None Dyspnea:  Yes Nausea/Vomiting:  No Diarrhea:  No Constipation:   No Depression:   No Anxiety:   Yes Fatigue:   Yes  CODE STATUS: Full code   PAST MEDICAL HISTORY: Past Medical History  Diagnosis Date  . Swelling of ankle   . SOB (shortness of breath)   . Hypertension   . Asthma   . Arthritis   . Atrial fibrillation 2011  . Cancer 2011    Left breast  . Cancer of leg   . Malignant neoplasm of upper-outer quadrant of female breast     left   . Memory loss   . Dementia   . GERD (gastroesophageal reflux disease)   . Hyperlipidemia   . Anemia   . Iron deficiency   . History of colonic polyps   . Lumbago     PAST SURGICAL HISTORY:  Past Surgical History  Procedure Laterality Date  . Skin cancer excision  2008    right leg  . Mastectomy    . Colonoscopy  2010  . Breast surgery Left 03/2010    mastectomy  . Breast biopsy Left 2009    Vital Signs: BP 130/67 mmHg  Pulse 100  Temp(Src) 97.5 F (36.4 C) (Oral)  Resp 18  Ht 5\' 1"  (1.549 m)  Wt 58.01 kg (127 lb 14.2 oz)  BMI 24.18 kg/m2  SpO2 100% Filed Weights   01/23/15 0434 01/24/15 0408 01/25/15 0419  Weight: 54.568 kg (120 lb 4.8 oz) 58.287 kg (128 lb 8 oz) 58.01 kg (127 lb 14.2 oz)    Estimated body mass index is 24.18 kg/(m^2) as calculated from the following:   Height as of this encounter: 5\' 1"  (1.549 m).   Weight as of this encounter: 58.01 kg (127 lb 14.2 oz).  PHYSICAL EXAM: General: elderly,  ill-appearing HEENT: OP clear, poor dentition, decreased hearing Neck: Trachea midline, no LAN Cardiovascular: irregular rate and rhythm, tachycardic Pulmonary/Chest: fair air movement, clear ant fields Abdominal: Soft, NTTP,  hypoactive bowel sounds GU: No SP tenderness Extremities: No edema  Neurological: Grossly nonfocal Skin: no rashes, ecchymoses B.UE's Psychiatric: alert, oriented to person, place, not year, doesn't know daughter's name  LABS: CBC:    Component Value Date/Time   WBC 6.9 01/22/2015 0515   WBC 6.8 09/17/2014 0415   HGB 9.0* 01/22/2015 0515   HGB 9.1* 09/17/2014 0415   HCT 28.4* 01/22/2015 0515   HCT 27.7* 09/17/2014 0415   PLT 171 01/22/2015 0515   PLT 238 09/17/2014 0415   MCV 97.1 01/22/2015 0515   MCV 97 09/17/2014 0415   NEUTROABS 24.2* 01/17/2015 1850   NEUTROABS 3.7 09/17/2014 0415   LYMPHSABS 1.4 01/17/2015 1850   LYMPHSABS 2.1 09/17/2014 0415   MONOABS 1.3* 01/17/2015 1850   MONOABS 0.9 09/17/2014 0415   EOSABS 0.0 01/17/2015 1850   EOSABS 0.0 09/17/2014 0415   BASOSABS 0.1 01/17/2015 1850   BASOSABS 0.0 09/17/2014 0415   Comprehensive Metabolic Panel:    Component Value Date/Time   NA 141  01/24/2015 0441   NA 138 09/17/2014 0415   NA 139 10/05/2011 1126   K 2.8* 01/24/2015 0441   K 3.5 09/17/2014 0415   CL 107 01/24/2015 0441   CL 101 09/17/2014 0415   CO2 30 01/24/2015 0441   CO2 34* 09/17/2014 0415   BUN 27* 01/24/2015 0441   BUN 22* 09/17/2014 0415   BUN 28 10/05/2011 1126   CREATININE 1.54* 01/24/2015 0441   CREATININE 1.57* 09/17/2014 0415   GLUCOSE 99 01/24/2015 0441   GLUCOSE 83 09/17/2014 0415   GLUCOSE 84 10/05/2011 1126   CALCIUM 9.4 01/24/2015 0441   CALCIUM 8.1* 09/17/2014 0415   AST 25 01/17/2015 1850   AST 24 09/09/2014 0902   ALT 11* 01/17/2015 1850   ALT 15 09/09/2014 0902   ALKPHOS 47 01/17/2015 1850   ALKPHOS 61 09/09/2014 0902   BILITOT 0.7 01/17/2015 1850   PROT 8.2* 01/17/2015 1850   PROT 8.5*  09/09/2014 0902   ALBUMIN 3.1* 01/17/2015 1850   ALBUMIN 3.1* 09/09/2014 0902    IMPRESSION: Jill Ellison is a 81 yo woman with PMH of HTN, hyperlipidemia, a.fib, asthma, dementia, diastolic dysfunction, OA, OP s/p L.hip fx (12/2012), L.breast cancer s/p mastectomy (03/2010), iron def anemia, colon polyps. She was admitted 01/17/15 with n/v, shortness of breath, fever. 02 sats reportedly 79% and CXR shows LUL pneumonia. Pt also in a.fib with RVR. Blood cultures subsequently + for Strep pneumo. Hospital course further complicated by rapid a.fib and persistent shortness of breath.  Pt says that she does not feel well but can't explain further. She does say that she hopes she can go home but also says that she hopes her family can care for her.   I spoke with pt's granddaughter, Sharlyne Cai, who shares pt's HCPOA with sister, Ailene. Granddaughters are insistent that pt can return home at discharge. Pt may qualify for hospice in her home based on protein calorie malnutrition and dementia in addition to other chronic medical problems. Will order hospice screen.   Again discussed code status with granddaughter and pt remains a full code.   PLAN: Hospice screen  REFERRALS TO BE ORDERED:  Hospice   More than 50% of the visit was spent in counseling/coordination of care: YES  Time spent: 35 minutes

## 2015-01-25 NOTE — Progress Notes (Signed)
Patient ID: Jill Ellison, female   DOB: 02-09-1917, 81 y.o.   MRN: [id number] [facility] PROGRESS NOTE  Jill Ellison [id number] DOB: October 08, 1916 DOA: 01/17/2015 PCP: Margarita Rana, MD  HPI/Subjective: Patient states that she still cannot breathe very well. Still has nasal congestion. Some cough but not much production.  Objective: Filed Vitals:   01/25/15 1001  BP: 130/67  Pulse: 100  Temp: 97.5 F (36.4 C)  Resp: 18    Intake/Output Summary (Last 24 hours) at 01/25/15 1201 Last data filed at 01/25/15 0928  Gross per 24 hour  Intake    170 ml  Output   2200 ml  Net  -2030 ml   Filed Weights   01/23/15 0434 01/24/15 0408 01/25/15 0419  Weight: 54.568 kg (120 lb 4.8 oz) 58.287 kg (128 lb 8 oz) 58.01 kg (127 lb 14.2 oz)    ROS: Review of Systems  Constitutional: Negative for fever and chills.  Eyes: Negative for blurred vision.  Respiratory: Positive for cough and shortness of breath.   Cardiovascular: Negative for chest pain.  Gastrointestinal: Negative for nausea, vomiting, abdominal pain, diarrhea and constipation.  Genitourinary: Negative for dysuria.  Musculoskeletal: Negative for joint pain.  Neurological: Negative for dizziness and headaches.   Exam: Physical Exam  HENT:  Nose: No mucosal edema.  Mouth/Throat: No oropharyngeal exudate or posterior oropharyngeal edema.  Eyes: Conjunctivae, EOM and lids are normal. Pupils are equal, round, and reactive to light.  Neck: No JVD present. Carotid bruit is not present. No edema present. No thyroid mass and no thyromegaly present.  Cardiovascular: S1 normal and S2 normal.  An irregularly irregular rhythm present. Exam reveals no gallop.   No murmur heard. Pulses:      Dorsalis pedis pulses are 2+ on the right side, and 2+ on the left side.  Respiratory: No respiratory distress. She has decreased breath sounds in the right upper field, the right lower field, the left upper field, the left middle  field and the left lower field. She has no wheezes. She has rhonchi in the right lower field and the left lower field. She has no rales.  GI: Soft. Bowel sounds are normal. There is no tenderness.  Lymphadenopathy:    She has no cervical adenopathy.  Neurological: She is alert. No cranial nerve deficit.  Skin: Skin is warm. No rash noted. Nails show no clubbing.  Psychiatric: She has a normal mood and affect.     Data Reviewed: Basic Metabolic Panel:  Recent Labs Lab 01/19/15 0413 01/21/15 0511 01/22/15 0515 01/23/15 0438 01/24/15 0441  NA 140 142 142  --  141  K 3.5 3.2* 3.2*  --  2.8*  CL 105 107 110  --  107  CO2 28 27 28   --  30  GLUCOSE 92 101* 102*  --  99  BUN 37* 26* 26*  --  27*  CREATININE 7.20* 9.47*  DUPLICATE REQUEST 0.96* 1.62* 1.54*  CALCIUM 8.4* 8.5* 8.8*  --  9.4    CBC:  Recent Labs Lab 01/19/15 0413 01/21/15 0511 01/22/15 0515  WBC 9.2 7.1 6.9  HGB 8.8* 8.5* 9.0*  HCT 27.0* 26.6* 28.4*  MCV 96.0 97.5 97.1  PLT 283 662  DUPLICATE REQUEST 947     Scheduled Meds: . calcium-vitamin D  1 tablet Oral BID  . cefTRIAXone (ROCEPHIN)  IV  2 g Intravenous Q24H  . cholecalciferol  1,000 Units Oral Daily  . diltiazem  120 mg Oral Daily  .  fluticasone  2 spray Each Nare Daily  . furosemide  40 mg Intravenous Once  . ipratropium-albuterol  3 mL Nebulization QID  . letrozole  2.5 mg Oral Daily  . levofloxacin  500 mg Oral Q48H  . lisinopril  20 mg Oral Daily  . loratadine  10 mg Oral Daily  . magnesium oxide  800 mg Oral Daily  . methylPREDNISolone (SOLU-MEDROL) injection  40 mg Intravenous Q6H  . metoprolol tartrate  12.5 mg Oral BID  . multivitamin with minerals  1 tablet Oral Daily  . potassium chloride  40 mEq Oral BID  . warfarin  4 mg Oral q1800  . Warfarin - Pharmacist Dosing Inpatient   Does not apply q1800    Assessment/Plan:  1. Clinical sepsis, Streptococcus pneumoniae in blood culture on May 9, pneumonia left upper lobe and possibly  lower lobe. The Streptococcus pneumonia continue Rocephin and Levaquin. 2. Rapid atrial fibrillation. Heart rate in satisfactory range today on Cardizem and metoprolol. 3. Acute respiratory failure with hypoxia patient currently on 5 L nasal cannula. I will give 1 dose of Lasix 40 mg IV 1. Chest x-ray looked like fluid overload. Continue Solu-Medrol. 4. Acute renal failure on chronic kidney disease. Continue to monitor with diuresis 5. Hypokalemia: Will replace potassium 3 times today and twice a day tomorrow. Recheck today and tomorrow. 6. History of breast cancer on letrozole. 7. Hypertension essential blood pressure stable on usual medications 8. Anemia likely dilutional with IV fluid hydration.  Code Status:     Code Status Orders        Start     Ordered   01/17/15 2038  Full code   Continuous     01/17/15 2038     Family discussion: Spoke with granddaughter yesterday and left message for her today. Disposition Plan: Hopefully home  Time spent: 20 minutes.  Loletha Grayer  [facility]

## 2015-01-25 NOTE — Progress Notes (Signed)
Physical Therapy Treatment Patient Details Name: Jill Ellison MRN: [id number] DOB: 1917/05/08 Today's Date: 01/25/2015    History of Present Illness Pt is a pleasant 81 year old female who was admitted for sepsis and pnemonia. Pt with complaints of fever and SOB and is now on 5L of O2.    PT Comments    Pt is making improved progress towards goals this session. Pt able to tolerate there-ex in addition to other therapy treatment, however O2 sats still decreasing at this time while on 6L decreasing to low 80s. Pt able to perform pursed lip breathing with sats improving to 95%. Pt still heavy +2 assist to [facility] and unable to ambulate further distance at this time. Therapist spoke to family regarding SNF placement. They are continuing to refuse secondary to previous bad experience with previous SNF.  Follow Up Recommendations  SNF     Equipment Recommendations  Rolling walker with 5" wheels;Wheelchair (measurements PT)    Recommendations for Other Services       Precautions / Restrictions Precautions Precautions: Fall Restrictions Weight Bearing Restrictions: No    Mobility  Bed Mobility               General bed mobility comments: not performed as pt in recliner upon arrival  Transfers Overall transfer level: Needs assistance Equipment used: Rolling walker (2 wheeled) Transfers: Sit to/from Stand           General transfer comment: sit<>Stand with mod assist + 2 and rw. Pt with forward flexed posture and requires heavy cues for correct use of rw.  Ambulation/Gait Ambulation/Gait assistance: Mod assist;+2 physical assistance Ambulation Distance (Feet): 5 Feet Assistive device: Rolling walker (2 wheeled)     Gait velocity interpretation: Below normal speed for age/gender General Gait Details: Pt able to take few steps to [facility], however fatigues quickly and requires mod/max assist + 2 with heavy cues for sequencing. Pt performed all mobility while on 6L of O2 with  sats decreasing to 83% with limited mobility. RW used for assistance.   Stairs            Wheelchair Mobility    Modified Rankin (Stroke Patients Only)       Balance                                    Cognition Arousal/Alertness: Lethargic Behavior During Therapy: WFL for tasks assessed/performed Overall Cognitive Status: Within Functional Limits for tasks assessed                      Exercises Other Exercises Other Exercises: Pt able to perform 12 reps of B LE ther-ex this session including alt. LAQ, SAQ, hip abd/add, hip add squeezes, and SLRs. All ther-ex performed with min assist for sequencing. 1 rest break required secondary to fatigue.     General Comments        Pertinent Vitals/Pain Pain Assessment: No/denies pain    Home Living                      Prior Function            PT Goals (current goals can now be found in the care plan section) Acute Rehab PT Goals Patient Stated Goal: to get stronger PT Goal Formulation: With patient Time For Goal Achievement: 02/02/15 Potential to Achieve Goals: Good Progress towards PT goals: Progressing  toward goals    Frequency  Min 2X/week    PT Plan Current plan remains appropriate    Co-evaluation             End of Session Equipment Utilized During Treatment: Gait belt;Oxygen Activity Tolerance: Patient limited by fatigue Patient left: in chair;with chair alarm set     Time: 1459-1526 PT Time Calculation (min) (ACUTE ONLY): 27 min  Charges:  $Therapeutic Exercise: 8-22 mins $Therapeutic Activity: 8-22 mins                    G Codes:     Greggory Stallion, PT, DPT [phone]  Shaquella Stamant 01/25/2015, 3:54 PM

## 2015-01-25 NOTE — Progress Notes (Signed)
[place] [place] for Coumadin Indication: atrial fibrillation  No Known Allergies  Patient Measurements: Height: 5\' 1"  (154.9 cm) Weight: 127 lb 14.2 oz (58.01 kg) IBW/kg (Calculated) : 47.8  Vital Signs: Temp: 97.6 F (36.4 C) (05/17 0419) Temp Source: Oral (05/17 0419) BP: 132/84 mmHg (05/17 0419) Pulse Rate: 55 (05/17 0419)  Labs:  Recent Labs  01/23/15 0438 01/24/15 0441 01/25/15 0430  LABPROT 28.8* 26.7* 25.2*  INR 2.70 2.45 2.27  CREATININE 1.62* 1.54*  --      Medical History: Past Medical History  Diagnosis Date  . Swelling of ankle   . SOB (shortness of breath)   . Hypertension   . Asthma   . Arthritis   . Atrial fibrillation 2011  . Cancer 2011    Left breast  . Cancer of leg   . Malignant neoplasm of upper-outer quadrant of female breast     left   . Memory loss   . Dementia   . GERD (gastroesophageal reflux disease)   . Hyperlipidemia   . Anemia   . Iron deficiency   . History of colonic polyps   . Lumbago     Medications:  Prescriptions prior to admission  Medication Sig Dispense Refill Last Dose  . calcium-vitamin D (OSCAL WITH D) 500-200 MG-UNIT per tablet Take 1 tablet by mouth 2 (two) times daily.     Past Month at Unknown time  . Cholecalciferol (VITAMIN D3) 1000 UNITS CAPS Take 1 capsule by mouth daily.     Past Month at Unknown time  . diltiazem (CARDIZEM CD) 120 MG 24 hr capsule Take 1 capsule (120 mg total) by mouth daily. 30 capsule 11 01/16/2015 at Unknown time  . furosemide (LASIX) 20 MG tablet TAKE 1 TABLET EVERY DAY 30 tablet 6 01/16/2015 at Unknown time  . KLOR-CON M20 20 MEQ tablet TAKE 1 TABLET BY MOUTH EVERY DAY 30 tablet 3 01/16/2015 at Unknown time  . letrozole (FEMARA) 2.5 MG tablet Take 2.5 mg by mouth daily.     01/16/2015 at Unknown time  . lisinopril (PRINIVIL,ZESTRIL) 20 MG tablet TAKE 1 TABLET BY MOUTH DAILY 30 tablet 6 01/16/2015 at Unknown time  . Magnesium 250 MG TABS Take 2 tablets by mouth  daily.    Past Month at Unknown time  . metoprolol succinate (TOPROL-XL) 25 MG 24 hr tablet Take 25 mg by mouth daily.   01/16/2015 at 1700  . Multiple Vitamin (MULTIVITAMIN) tablet Take 1 tablet by mouth daily.     Past Month at Unknown time  . warfarin (COUMADIN) 5 MG tablet Take 5 mg by mouth daily.   01/16/2015 at Unknown time  . fexofenadine (ALLEGRA) 180 MG tablet Take 180 mg by mouth daily as needed for allergies.    prn  . levalbuterol (XOPENEX HFA) 45 MCG/ACT inhaler Inhale 1-2 puffs into the lungs every 4 (four) hours as needed.     rescue  . PROAIR HFA 108 (90 BASE) MCG/ACT inhaler Inhale 2 puffs into the lungs every 6 (six) hours as needed for wheezing or shortness of breath.   2 rescue  . warfarin (COUMADIN) 2 MG tablet TAKE AS DIRECTED BY ANTICOAGULATION CLINIC (Patient not taking: Reported on 01/17/2015) 75 tablet 3 Taking   Scheduled:  . calcium-vitamin D  1 tablet Oral BID  . cefTRIAXone (ROCEPHIN)  IV  2 g Intravenous Q24H  . cholecalciferol  1,000 Units Oral Daily  . diltiazem  120 mg Oral Daily  . fluticasone  2 spray Each Nare Daily  . ipratropium-albuterol  3 mL Nebulization QID  . letrozole  2.5 mg Oral Daily  . levofloxacin  500 mg Oral Q48H  . lisinopril  20 mg Oral Daily  . loratadine  10 mg Oral Daily  . magnesium oxide  800 mg Oral Daily  . methylPREDNISolone (SOLU-MEDROL) injection  40 mg Intravenous Q6H  . metoprolol tartrate  12.5 mg Oral BID  . multivitamin with minerals  1 tablet Oral Daily  . potassium chloride  40 mEq Oral BID  . warfarin  4 mg Oral q1800  . Warfarin - Pharmacist Dosing Inpatient   Does not apply q1800   Infusions:    PRN: acetaminophen **OR** acetaminophen, albuterol, ondansetron **OR** ondansetron (ZOFRAN) IV, sodium chloride Anti-infectives    Start     Dose/Rate Route Frequency Ordered Stop   01/23/15 0900  levofloxacin (LEVAQUIN) tablet 750 mg  Status:  Discontinued     750 mg Oral Every 48 hours 01/21/15 1028 01/22/15 1130    01/23/15 0900  levofloxacin (LEVAQUIN) tablet 500 mg     500 mg Oral Every 48 hours 01/22/15 1130     01/22/15 1800  piperacillin-tazobactam (ZOSYN) IVPB 3.375 g  Status:  Discontinued     3.375 g 12.5 mL/hr over 240 Minutes Intravenous Every 12 hours 01/22/15 1130 01/22/15 1414   01/22/15 1430  cefTRIAXone (ROCEPHIN) 2 g in dextrose 5 % 50 mL IVPB - Premix     2 g 100 mL/hr over 30 Minutes Intravenous Every 24 hours 01/22/15 1415     01/22/15 1400  vancomycin (VANCOCIN) IVPB 750 mg/150 ml premix  Status:  Discontinued     750 mg 150 mL/hr over 60 Minutes Intravenous Every 36 hours 01/21/15 1438 01/22/15 1414   01/21/15 1400  piperacillin-tazobactam (ZOSYN) IVPB 3.375 g  Status:  Discontinued     3.375 g 12.5 mL/hr over 240 Minutes Intravenous 3 times per day 01/21/15 1152 01/22/15 1130   01/21/15 1300  vancomycin (VANCOCIN) IVPB 1000 mg/200 mL premix     1,000 mg 200 mL/hr over 60 Minutes Intravenous  Once 01/21/15 1152 01/21/15 1500   01/21/15 1000  levofloxacin (LEVAQUIN) IVPB 500 mg  Status:  Discontinued     500 mg 100 mL/hr over 60 Minutes Intravenous Every 48 hours 01/20/15 1530 01/21/15 1028   01/19/15 1800  levofloxacin (LEVAQUIN) IVPB 750 mg  Status:  Discontinued     750 mg 100 mL/hr over 90 Minutes Intravenous Every 48 hours 01/17/15 2249 01/20/15 1530   01/19/15 0200  vancomycin (VANCOCIN) IVPB 1000 mg/200 mL premix  Status:  Discontinued     1,000 mg 200 mL/hr over 60 Minutes Intravenous Every 36 hours 01/18/15 1259 01/18/15 1300   01/18/15 1400  piperacillin-tazobactam (ZOSYN) IVPB 3.375 g  Status:  Discontinued     3.375 g 12.5 mL/hr over 240 Minutes Intravenous 3 times per day 01/18/15 1259 01/19/15 1307   01/18/15 1400  vancomycin (VANCOCIN) IVPB 1000 mg/200 mL premix  Status:  Discontinued     1,000 mg 200 mL/hr over 60 Minutes Intravenous Every 36 hours 01/18/15 1300 01/19/15 1307   01/18/15 1330  vancomycin (VANCOCIN) IVPB 750 mg/150 ml premix  Status:   Discontinued     750 mg 150 mL/hr over 60 Minutes Intravenous  Once 01/18/15 1259 01/18/15 1300   01/17/15 1945  levofloxacin (LEVAQUIN) IVPB 750 mg     750 mg 100 mL/hr over 90 Minutes Intravenous  Once 01/17/15 1940 01/17/15 2155  01/17/15 1900  vancomycin (VANCOCIN) IVPB 1000 mg/200 mL premix     1,000 mg 200 mL/hr over 60 Minutes Intravenous  Once 01/17/15 1851 01/18/15 0140   01/17/15 1900  piperacillin-tazobactam (ZOSYN) IVPB 3.375 g     3.375 g 12.5 mL/hr over 240 Minutes Intravenous  Once 01/17/15 1851 01/17/15 1930     Patient on coumadin for afib as an outpatient, most recently taking 5mg  daily.   INR Coumadin Dose  5/9 1.67 -  5/10  2mg   5/11 1.75 2mg   5/12 1.40 2mg   5/13 1.55 5mg   5/14 1.79 4mg   5/15 2.70 3 mg   5/16 2.45 3 mg   5/17 2.27      Assessment: Therapeutic  Goal of Therapy:  INR 2-3   Plan:  Will  Increase to warfarin 4 mg PO daily. Recheck INR with AM labs  Pharmacy to follow per consult  Larene Beach, PharmD   01/25/2015,8:17 AM

## 2015-01-26 LAB — CULTURE, BLOOD (ROUTINE X 2)
CULTURE: NO GROWTH
CULTURE: NO GROWTH
SPECIAL REQUESTS: NORMAL
Special Requests: NORMAL

## 2015-01-26 LAB — BASIC METABOLIC PANEL
ANION GAP: 5 (ref 5–15)
BUN: 42 mg/dL — ABNORMAL HIGH (ref 6–20)
CALCIUM: 9.2 mg/dL (ref 8.9–10.3)
CO2: 35 mmol/L — ABNORMAL HIGH (ref 22–32)
CREATININE: 1.5 mg/dL — AB (ref 0.44–1.00)
Chloride: 101 mmol/L (ref 101–111)
GFR calc Af Amer: 33 mL/min — ABNORMAL LOW (ref >60)
GFR calc non Af Amer: 28 mL/min — ABNORMAL LOW (ref >60)
Glucose, Bld: 150 mg/dL — ABNORMAL HIGH (ref 65–99)
Potassium: 4.5 mmol/L (ref 3.5–5.1)
SODIUM: 141 mmol/L (ref 135–145)

## 2015-01-26 LAB — PROTIME-INR
INR: 2.57
Prothrombin Time: 27.7 seconds — ABNORMAL HIGH (ref 11.4–15.0)

## 2015-01-26 MED ORDER — POTASSIUM CHLORIDE CRYS ER 20 MEQ PO TBCR
20.0000 meq | EXTENDED_RELEASE_TABLET | Freq: Two times a day (BID) | ORAL | Status: DC
  Administered 2015-01-26 – 2015-01-27 (×2): 20 meq via ORAL
  Filled 2015-01-26 (×2): qty 1

## 2015-01-26 MED ORDER — WARFARIN SODIUM 2.5 MG PO TABS
2.5000 mg | ORAL_TABLET | Freq: Every day | ORAL | Status: DC
  Administered 2015-01-26: 2.5 mg via ORAL
  Filled 2015-01-26: qty 1

## 2015-01-26 MED ORDER — FUROSEMIDE 10 MG/ML IJ SOLN
40.0000 mg | Freq: Every day | INTRAMUSCULAR | Status: DC
  Administered 2015-01-26 – 2015-01-27 (×2): 40 mg via INTRAVENOUS
  Filled 2015-01-26 (×2): qty 4

## 2015-01-26 NOTE — Progress Notes (Signed)
Patient ID: Jill Ellison, female   DOB: [birth year], 81 y.o.   MRN: [id number] [facility] PROGRESS NOTE  PCP: Margarita Rana, MD  HPI/Subjective: Patient feels better today. She is asking when she can go home. She states that her breathing is better today. Still feels pretty weak.  Objective: Filed Vitals:   01/26/15 1216  BP: 119/65  Pulse: 100  Temp: 98 F (36.7 C)  Resp: 22    Intake/Output Summary (Last 24 hours) at 01/26/15 1232 Last data filed at 01/26/15 0810  Gross per 24 hour  Intake    410 ml  Output   1900 ml  Net  -1490 ml   Filed Weights   01/24/15 0408 01/25/15 0419 01/26/15 0504  Weight: 58.287 kg (128 lb 8 oz) 58.01 kg (127 lb 14.2 oz) 57.017 kg (125 lb 11.2 oz)    ROS: Review of Systems  Constitutional: Negative for fever and chills.  Eyes: Negative for blurred vision.  Respiratory: Positive for shortness of breath. Negative for cough.   Cardiovascular: Negative for chest pain.  Gastrointestinal: Negative for nausea, vomiting, abdominal pain, diarrhea and constipation.  Genitourinary: Negative for dysuria.  Musculoskeletal: Negative for joint pain.  Neurological: Negative for dizziness and headaches.   Exam: Physical Exam  HENT:  Nose: No mucosal edema.  Mouth/Throat: No oropharyngeal exudate or posterior oropharyngeal edema.  Eyes: Conjunctivae, EOM and lids are normal. Pupils are equal, round, and reactive to light.  Neck: No JVD present. Carotid bruit is not present. No edema present. No thyroid mass and no thyromegaly present.  Cardiovascular: S1 normal and S2 normal.  An irregularly irregular rhythm present. Tachycardia present.  Exam reveals no gallop.   No murmur heard. Pulses:      Dorsalis pedis pulses are 2+ on the right side, and 2+ on the left side.  Respiratory: No respiratory distress. She has decreased breath sounds in the right lower field and the left lower field. She has no wheezes. She has rhonchi in the right  lower field and the left lower field. She has no rales.  Better air entry today.  GI: Soft. Bowel sounds are normal. There is no tenderness.  Lymphadenopathy:    She has no cervical adenopathy.  Neurological: She is alert. No cranial nerve deficit.  Skin: Skin is warm. No rash noted. Nails show no clubbing.  Psychiatric: She has a normal mood and affect.    Data Reviewed: Basic Metabolic Panel:  Recent Labs Lab 01/21/15 0511 01/22/15 0515 01/23/15 0438 01/24/15 0441 01/25/15 1250 01/26/15 0429  NA 142 142  --  141 141 141  K 3.2* 3.2*  --  2.8* 4.4 4.5  CL 107 110  --  107 102 101  CO2 27 28  --  30 33* 35*  GLUCOSE 101* 102*  --  99 159* 150*  BUN 26* 26*  --  27* 34* 42*  CREATININE 0.94*  DUPLICATE REQUEST 7.09* 1.62* 1.54* 1.48* 1.50*  CALCIUM 8.5* 8.8*  --  9.4 9.3 9.2    CBC:  Recent Labs Lab 01/21/15 0511 01/22/15 0515  WBC 7.1 6.9  HGB 8.5* 9.0*  HCT 26.6* 28.4*  MCV 97.5 62.8  PLT 366  DUPLICATE REQUEST 294     Scheduled Meds: . calcium-vitamin D  1 tablet Oral BID  . cefTRIAXone (ROCEPHIN)  IV  2 g Intravenous Q24H  . cholecalciferol  1,000 Units Oral Daily  . diltiazem  120 mg Oral Daily  . fluticasone  2  spray Each Nare Daily  . furosemide  40 mg Intravenous Daily  . ipratropium-albuterol  3 mL Nebulization QID  . letrozole  2.5 mg Oral Daily  . levofloxacin  500 mg Oral Q48H  . lisinopril  20 mg Oral Daily  . loratadine  10 mg Oral Daily  . magnesium oxide  800 mg Oral Daily  . methylPREDNISolone (SOLU-MEDROL) injection  40 mg Intravenous Q6H  . metoprolol tartrate  12.5 mg Oral BID  . multivitamin with minerals  1 tablet Oral Daily  . potassium chloride  40 mEq Oral BID  . warfarin  2.5 mg Oral q1800  . Warfarin - Pharmacist Dosing Inpatient   Does not apply q1800    Assessment/Plan:  1. Clinical sepsis, Streptococcus pneumoniae in blood culture on May 9, pneumonia left upper lobe and possibly lower lobe. The Streptococcus pneumonia  continue Rocephin and Levaquin. 2. Rapid atrial fibrillation. Heart rate in satisfactory range today on Cardizem and metoprolol. 3. Acute respiratory failure with hypoxia - patient on 3 L nasal cannula today and I still think we can titrate this down further. I think this was all fluid overload from IV fluids and antibiotics. She is on Lasix IV daily. I will continue that for right now. She is moving better air today. 4. Acute renal failure on chronic kidney disease. Continue to monitor with diuresis 5. Hypokalemia: Improved with replacement. 6. History of breast cancer on letrozole. 7. Hypertension essential blood pressure stable on usual medications 8. Anemia likely dilutional with IV fluid hydration. 9. Potential discharge home on Friday- this was discussed with Mrs. Turner the patient's granddaughter and care management team.  Code Status:     Code Status Orders        Start     Ordered   01/17/15 2038  Full code   Continuous     01/17/15 2038     Family discussion: Spoke with granddaughter. Disposition Plan: Hopefully home Friday.  Time spent: 23 minutes.  Loletha Grayer  [facility]

## 2015-01-26 NOTE — Care Management (Signed)
Palliative care reconsulted. Found order from palliative care for hospice screen.  Left voicemail message for grandaughter  [name] to discuss this concept and offer agency choice.  Patient is not able to make this decision herself.  [name] is the granddaughter that makes all the decisions  and Alene  provides the hands on care.  Attending anticipates discharge within next 48 hours to home.  Patient remains a full code.

## 2015-01-26 NOTE — Progress Notes (Signed)
Nutrition Follow-up  DOCUMENTATION CODES:   INTERVENTION: Medical Nutrition Supplement: Continue  (Mighty Shakes TID with meals for additional nutrition.)  Meals and Snacks: Cater to patient preferences   NUTRITION DIAGNOSIS:  Inadequate oral intake related to acute illness as evidenced by other (see comment) (poor PO intake during hospitalization)- improving x last 24-36hrs.  GOAL:  Patient will meet greater than or equal to 90% of their needs   MONITOR:   (Energy Intake, Electrolyte and Renal Profile, UOP, Digestive Profile)  REASON FOR ASSESSMENT:   (RD Follow Up)    ASSESSMENT: Clinical Update: patient feeling better, wants to go home Typical Food/ Fluid Intake: 50-100% of meals recorded per I/O x last 24 hrs Weight Changes: None Labs:  Electrolyte and Renal Profile:    Recent Labs Lab 01/24/15 0441 01/25/15 1250 01/26/15 0429  BUN 27* 34* 42*  CREATININE 1.54* 1.48* 1.50*  NA 141 141 141  K 2.8* 4.4 4.5   Meds: Lasix, MVI Physical Findings: n/a  Height:  Ht Readings from Last 1 Encounters:  01/17/15 5\' 1"  (1.549 m)    Weight:  Wt Readings from Last 1 Encounters:  01/26/15 125 lb 11.2 oz (57.017 kg)    Ideal Body Weight:     Wt Readings from Last 10 Encounters:  01/26/15 125 lb 11.2 oz (57.017 kg)  10/13/14 130 lb (58.968 kg)  04/02/12 145 lb 8 oz (65.998 kg)  10/05/11 144 lb 8 oz (65.545 kg)  03/23/11 146 lb (66.225 kg)  09/15/10 146 lb (66.225 kg)  06/15/10 145 lb (65.772 kg)  03/14/10 148 lb (67.132 kg)  03/10/10 149 lb (67.586 kg)  03/06/10 150 lb (68.04 kg)    BMI:  Body mass index is 23.76 kg/(m^2).  Skin:  Reviewed, no issues  Diet Order:  Diet Heart Room service appropriate?: Yes; Fluid consistency:: Thin  EDUCATION NEEDS:  No education needs identified at this time   Intake/Output Summary (Last 24 hours) at 01/26/15 1437 Last data filed at 01/26/15 0810  Gross per 24 hour  Intake    360 ml  Output   1900 ml  Net   -1540 ml    Last BM:  01/22/15  Roda Shutters, RDN Pager: [phone] Office: [place]

## 2015-01-26 NOTE — Progress Notes (Signed)
Wean to 3 L of oxygen. A fib. Takes meds ok. A & O. Pt has not reported pain.BUN and Cr elevated. 1 assist to [facility]. IV abx for UTI. Up to chair and tolerated it well. Possible d/c on Friday. Pt has no further concerns at this time.

## 2015-01-26 NOTE — Progress Notes (Signed)
ANTIBIOTIC CONSULT NOTE - FOLLOW UP  Pharmacy Consult for Levaquin Indication: pneumonia  No Known Allergies  Patient Measurements: Height: 5\' 1"  (154.9 cm) Weight: 125 lb 11.2 oz (57.017 kg) IBW/kg (Calculated) : 47.8 Adjusted Body Weight:   Vital Signs: Temp: 98.9 F (37.2 C) (05/18 0451) BP: 133/75 mmHg (05/18 0745) Pulse Rate: 103 (05/18 0745) Intake/Output from previous day: 05/17 0701 - 05/18 0700 In: 290 [P.O.:240; IV Piggyback:50] Out: 1900 [Urine:1900] Intake/Output from this shift: Total I/O In: -  Out: 700 [Urine:700]  Labs:  Recent Labs  01/24/15 0441 01/25/15 1250 01/26/15 0429  CREATININE 1.54* 1.48* 1.50*   Estimated Creatinine Clearance: 16.6 mL/min (by C-G formula based on Cr of 1.5). No results for input(s): VANCOTROUGH, VANCOPEAK, VANCORANDOM, GENTTROUGH, GENTPEAK, GENTRANDOM, TOBRATROUGH, TOBRAPEAK, TOBRARND, AMIKACINPEAK, AMIKACINTROU, AMIKACIN in the last 72 hours.   Microbiology: Recent Results (from the past 720 hour(s))  Culture, blood (routine x 2)     Status: None   Collection Time: 01/17/15  6:50 PM  Result Value Ref Range Status   Specimen Description BLOOD  Final   Special Requests Normal  Final   Culture  Setup Time GRAM POSITIVE COCCI IN PAIRS ANAEROBIC BOTTLE ONLY  Final   Culture   Final    STREPTOCOCCUS PNEUMONIAE BLOOD ANAEROBIC BOTTLE CRITICAL RESULT CALLED TO, READ BACK BY AND VERIFIED WITH: CTJ TO PAMELA ALLEN AT 1155 01/18/15    Report Status 01/22/2015 FINAL  Final   Organism ID, Bacteria STREPTOCOCCUS PNEUMONIAE  Final      Susceptibility   Streptococcus pneumoniae - MIC (ETEST)*    OXACILLIN DISK Value in next row       38SENSITIVE    CEFTRIAXONE Value in next row Sensitive      38SENSITIVE    PENICILLIN Value in next row Sensitive      38SENSITIVE    LEVOFLOXACIN Value in next row Sensitive      38SENSITIVE    VANCOMYCIN Value in next row Sensitive      38SENSITIVE    ERYTHROMYCIN Value in next row Sensitive       38SENSITIVE    * STREPTOCOCCUS PNEUMONIAE  Culture, blood (routine x 2)     Status: None   Collection Time: 01/17/15  6:50 PM  Result Value Ref Range Status   Specimen Description BLOOD  Final   Special Requests Normal  Final   Culture NO GROWTH 5 DAYS  Final   Report Status 01/22/2015 FINAL  Final  C difficile quick scan w PCR reflex ([facility])     Status: None   Collection Time: 01/20/15  8:17 PM  Result Value Ref Range Status   C Diff antigen POSITIVE  Corrected    Comment: CORRECTED ON 05/13 AT 0742: PREVIOUSLY REPORTED AS POSITIVE CRITICAL RESULT CALLED TO, READ BACK BY AND VERIFIED WITH: IRIS GUIDRY AT 0135 01/21/15. TSH   C Diff toxin NEGATIVE  Final   C Diff interpretation   Corrected    Negative for toxigenic C. difficile. Toxin gene and active toxin production not detected. May be a nontoxigenic strain of C. difficile bacteria present, lacking the ability to produce toxin.    Comment: CORRECTED RESULTS CALLED TO: CATHY SUMMERLIN AT 0740 01/21/15 LAB CORRECTED ON 05/13 AT 1478: PREVIOUSLY REPORTED AS POSITIVE   Clostridium Difficile by PCR     Status: None   Collection Time: 01/20/15  8:17 PM  Result Value Ref Range Status   C difficile by pcr Toxigenic C.diff NEGATIVE NEGATIVE Final  Culture, blood (routine x 2)     Status: None   Collection Time: 01/21/15 11:09 AM  Result Value Ref Range Status   Specimen Description BLOOD  Final   Special Requests Normal  Final   Culture NO GROWTH 5 DAYS  Final   Report Status 01/26/2015 FINAL  Final  Culture, blood (routine x 2)     Status: None   Collection Time: 01/21/15 11:17 AM  Result Value Ref Range Status   Specimen Description BLOOD  Final   Special Requests Normal  Final   Culture NO GROWTH 5 DAYS  Final   Report Status 01/26/2015 FINAL  Final    Anti-infectives    Start     Dose/Rate Route Frequency Ordered Stop   01/23/15 0900  levofloxacin (LEVAQUIN) tablet 750 mg  Status:  Discontinued     750 mg Oral Every 48  hours 01/21/15 1028 01/22/15 1130   01/23/15 0900  levofloxacin (LEVAQUIN) tablet 500 mg     500 mg Oral Every 48 hours 01/22/15 1130     01/22/15 1800  piperacillin-tazobactam (ZOSYN) IVPB 3.375 g  Status:  Discontinued     3.375 g 12.5 mL/hr over 240 Minutes Intravenous Every 12 hours 01/22/15 1130 01/22/15 1414   01/22/15 1430  cefTRIAXone (ROCEPHIN) 2 g in dextrose 5 % 50 mL IVPB - Premix     2 g 100 mL/hr over 30 Minutes Intravenous Every 24 hours 01/22/15 1415     01/22/15 1400  vancomycin (VANCOCIN) IVPB 750 mg/150 ml premix  Status:  Discontinued     750 mg 150 mL/hr over 60 Minutes Intravenous Every 36 hours 01/21/15 1438 01/22/15 1414   01/21/15 1400  piperacillin-tazobactam (ZOSYN) IVPB 3.375 g  Status:  Discontinued     3.375 g 12.5 mL/hr over 240 Minutes Intravenous 3 times per day 01/21/15 1152 01/22/15 1130   01/21/15 1300  vancomycin (VANCOCIN) IVPB 1000 mg/200 mL premix     1,000 mg 200 mL/hr over 60 Minutes Intravenous  Once 01/21/15 1152 01/21/15 1500   01/21/15 1000  levofloxacin (LEVAQUIN) IVPB 500 mg  Status:  Discontinued     500 mg 100 mL/hr over 60 Minutes Intravenous Every 48 hours 01/20/15 1530 01/21/15 1028   01/19/15 1800  levofloxacin (LEVAQUIN) IVPB 750 mg  Status:  Discontinued     750 mg 100 mL/hr over 90 Minutes Intravenous Every 48 hours 01/17/15 2249 01/20/15 1530   01/19/15 0200  vancomycin (VANCOCIN) IVPB 1000 mg/200 mL premix  Status:  Discontinued     1,000 mg 200 mL/hr over 60 Minutes Intravenous Every 36 hours 01/18/15 1259 01/18/15 1300   01/18/15 1400  piperacillin-tazobactam (ZOSYN) IVPB 3.375 g  Status:  Discontinued     3.375 g 12.5 mL/hr over 240 Minutes Intravenous 3 times per day 01/18/15 1259 01/19/15 1307   01/18/15 1400  vancomycin (VANCOCIN) IVPB 1000 mg/200 mL premix  Status:  Discontinued     1,000 mg 200 mL/hr over 60 Minutes Intravenous Every 36 hours 01/18/15 1300 01/19/15 1307   01/18/15 1330  vancomycin (VANCOCIN) IVPB 750  mg/150 ml premix  Status:  Discontinued     750 mg 150 mL/hr over 60 Minutes Intravenous  Once 01/18/15 1259 01/18/15 1300   01/17/15 1945  levofloxacin (LEVAQUIN) IVPB 750 mg     750 mg 100 mL/hr over 90 Minutes Intravenous  Once 01/17/15 1940 01/17/15 2155   01/17/15 1900  vancomycin (VANCOCIN) IVPB 1000 mg/200 mL premix     1,000  mg 200 mL/hr over 60 Minutes Intravenous  Once 01/17/15 1851 01/18/15 0140   01/17/15 1900  piperacillin-tazobactam (ZOSYN) IVPB 3.375 g     3.375 g 12.5 mL/hr over 240 Minutes Intravenous  Once 01/17/15 1851 01/17/15 1930      Assessment: Levaquin and Rocephin for difficulty breathing. Strep pneumoniae sensitive to both   Goal of Therapy:    Plan:  Follow up culture results  Jeily Guthridge D 01/26/2015,9:49 AM

## 2015-01-26 NOTE — Progress Notes (Signed)
Speech Language Pathology Treatment: Dysphagia  Patient Details Name: Jill Ellison MRN: [id number] DOB: 1917-01-06 Today's Date: 01/26/2015 Time: 4098-1191 SLP Time Calculation (min) (ACUTE ONLY): 50 min  Assessment / Plan / Recommendation Clinical Impression  Pt appeared to safely tolerate trials of thin liquids and mech soft foods w/ no overt s/s of aspiration and no significant oral phase deficits noted. Pt demo. Clear vocal quality b/t trials; she fed self using small, single bites. She was able to endorse need for small bites of food and that certain foods were "too tough". She described the need to sit fully upright for oral intake. Pt appears at reduced risk for aspiration at this time. Rec. Continuing w/ current Dys. 3 diet w/ thin liquids w/ general aspiration precautions; Meds in Puree for easier/safer swallowing. Pt agreed. NSG updated.    HPI Other Pertinent Information: pt reported she "enjoyed" breakfast this morning; denied any trouble swallowing at that meal   Pertinent Vitals Pain Assessment: No/denies pain  SLP Plan  Continue with current plan of care f/u w/ toleration of diet next 1-3 days.   Recommendations Diet recommendations: Dysphagia 3 (mechanical soft);Thin liquid Liquids provided via: Cup;Straw Medication Administration: Whole meds with puree Supervision: Patient able to self feed;Intermittent supervision to cue for compensatory strategies Compensations: Slow rate;Small sips/bites Postural Changes and/or Swallow Maneuvers: Seated upright 90 degrees              Oral Care Recommendations: Oral care BID Follow up Recommendations: Skilled Nursing facility Plan: Continue with current plan of care    GO     Jill Ellison,Jill Ellison 01/26/2015, 4:25 PM

## 2015-01-26 NOTE — Progress Notes (Signed)
Physical Therapy Treatment Patient Details Name: Jill Ellison MRN: [id number] DOB: Apr 06, 1917 Today's Date: 01/26/2015    History of Present Illness Pt is a pleasant 81 year old female who was admitted for sepsis and pnemonia. Pt with complaints of fever and SOB and is now on 5L of O2.    PT Comments    Pt is making good progress towards goals and actually is very alert this session. Pt on decreased O2 level (3L) compared to previous date and is able to maintain O2 levels at 96% with exertion. Pt limited in mobility this date secondary to HR increasing to 156 with transfer to [facility]. Pt still heavy assist to [facility] with increased anxiety of falls, therefore requires heavy cues for sequencing. +2 assist for personal hygiene. Pt with increased posterior leaning and multiple LOB in posterior direction with transfers.   Follow Up Recommendations  SNF     Equipment Recommendations  Rolling walker with 5" wheels;Wheelchair (measurements PT)    Recommendations for Other Services       Precautions / Restrictions Precautions Precautions: Fall Restrictions Weight Bearing Restrictions: No    Mobility  Bed Mobility Overal bed mobility: Needs Assistance Bed Mobility: Supine to Sit           General bed mobility comments: supine->sit with min assist. Safe technique performed. Once seated at EOB, pt able to scoot towards EOB with cues.  Transfers Overall transfer level: Needs assistance Equipment used: Rolling walker (2 wheeled) Transfers: Sit to/from Stand           General transfer comment: sit<>Stand with mod assist + 2 and rw. Pt with forward flexed posture and requires heavy cues for correct use of rw.  Ambulation/Gait Ambulation/Gait assistance: Mod assist;+2 physical assistance Ambulation Distance (Feet): 5 Feet Assistive device: Rolling walker (2 wheeled)     Gait velocity interpretation: Below normal speed for age/gender General Gait Details: Pt able to take few  steps to [facility], however fatigues quickly and requires mod/max assist + 2 with heavy cues for sequencing. Pt performed all mobility while on 3L of O2 with sats maintaining at 96%. RW used for assistance.   Stairs            Wheelchair Mobility    Modified Rankin (Stroke Patients Only)       Balance                                    Cognition Arousal/Alertness: Awake/alert Behavior During Therapy: WFL for tasks assessed/performed Overall Cognitive Status: Within Functional Limits for tasks assessed                      Exercises Other Exercises Other Exercises: deferred    General Comments        Pertinent Vitals/Pain Pain Assessment: No/denies pain    Home Living                      Prior Function            PT Goals (current goals can now be found in the care plan section) Acute Rehab PT Goals Patient Stated Goal: to get stronger PT Goal Formulation: With patient Time For Goal Achievement: 02/02/15 Potential to Achieve Goals: Good Progress towards PT goals: Progressing toward goals    Frequency  Min 2X/week    PT Plan Current plan remains appropriate  Co-evaluation             End of Session Equipment Utilized During Treatment: Gait belt;Oxygen Activity Tolerance: Patient limited by fatigue Patient left: in chair;with chair alarm set     Time: 1141-1155 PT Time Calculation (min) (ACUTE ONLY): 14 min  Charges:  $Therapeutic Activity: 8-22 mins                    G Codes:     Greggory Stallion, PT, DPT [phone]  Mirabel Ahlgren 01/26/2015, 2:31 PM

## 2015-01-26 NOTE — Care Management (Signed)
Jill Ellison is agreeable to hospice services in the home.  Agency preference is [facility].  Notified Laureen Ochs of referral

## 2015-01-26 NOTE — Progress Notes (Signed)
[place] for Coumadin Indication: atrial fibrillation  No Known Allergies  Patient Measurements: Height: 5\' 1"  (154.9 cm) Weight: 125 lb 11.2 oz (57.017 kg) IBW/kg (Calculated) : 47.8  Vital Signs: Temp: 98.9 F (37.2 C) (05/18 0451) BP: 133/75 mmHg (05/18 0745) Pulse Rate: 103 (05/18 0745)  Labs:  Recent Labs  01/24/15 0441 01/25/15 0430 01/25/15 1250 01/26/15 0429  LABPROT 26.7* 25.2*  --  27.7*  INR 2.45 2.27  --  2.57  CREATININE 1.54*  --  1.48* 1.50*     Medical History: Past Medical History  Diagnosis Date  . Swelling of ankle   . SOB (shortness of breath)   . Hypertension   . Asthma   . Arthritis   . Atrial fibrillation 2011  . Cancer 2011    Left breast  . Cancer of leg   . Malignant neoplasm of upper-outer quadrant of female breast     left   . Memory loss   . Dementia   . GERD (gastroesophageal reflux disease)   . Hyperlipidemia   . Anemia   . Iron deficiency   . History of colonic polyps   . Lumbago     Medications:  Prescriptions prior to admission  Medication Sig Dispense Refill Last Dose  . calcium-vitamin D (OSCAL WITH D) 500-200 MG-UNIT per tablet Take 1 tablet by mouth 2 (two) times daily.     Past Month at Unknown time  . Cholecalciferol (VITAMIN D3) 1000 UNITS CAPS Take 1 capsule by mouth daily.     Past Month at Unknown time  . diltiazem (CARDIZEM CD) 120 MG 24 hr capsule Take 1 capsule (120 mg total) by mouth daily. 30 capsule 11 01/16/2015 at Unknown time  . furosemide (LASIX) 20 MG tablet TAKE 1 TABLET EVERY DAY 30 tablet 6 01/16/2015 at Unknown time  . KLOR-CON M20 20 MEQ tablet TAKE 1 TABLET BY MOUTH EVERY DAY 30 tablet 3 01/16/2015 at Unknown time  . letrozole (FEMARA) 2.5 MG tablet Take 2.5 mg by mouth daily.     01/16/2015 at Unknown time  . lisinopril (PRINIVIL,ZESTRIL) 20 MG tablet TAKE 1 TABLET BY MOUTH DAILY 30 tablet 6 01/16/2015 at Unknown time  . Magnesium 250 MG TABS Take 2 tablets by  mouth daily.    Past Month at Unknown time  . metoprolol succinate (TOPROL-XL) 25 MG 24 hr tablet Take 25 mg by mouth daily.   01/16/2015 at 1700  . Multiple Vitamin (MULTIVITAMIN) tablet Take 1 tablet by mouth daily.     Past Month at Unknown time  . warfarin (COUMADIN) 5 MG tablet Take 5 mg by mouth daily.   01/16/2015 at Unknown time  . fexofenadine (ALLEGRA) 180 MG tablet Take 180 mg by mouth daily as needed for allergies.    prn  . levalbuterol (XOPENEX HFA) 45 MCG/ACT inhaler Inhale 1-2 puffs into the lungs every 4 (four) hours as needed.     rescue  . PROAIR HFA 108 (90 BASE) MCG/ACT inhaler Inhale 2 puffs into the lungs every 6 (six) hours as needed for wheezing or shortness of breath.   2 rescue  . warfarin (COUMADIN) 2 MG tablet TAKE AS DIRECTED BY ANTICOAGULATION CLINIC (Patient not taking: Reported on 01/17/2015) 75 tablet 3 Taking   Scheduled:  . calcium-vitamin D  1 tablet Oral BID  . cefTRIAXone (ROCEPHIN)  IV  2 g Intravenous Q24H  . cholecalciferol  1,000 Units Oral Daily  . diltiazem  120 mg Oral Daily  .  fluticasone  2 spray Each Nare Daily  . furosemide  40 mg Intravenous Daily  . ipratropium-albuterol  3 mL Nebulization QID  . letrozole  2.5 mg Oral Daily  . levofloxacin  500 mg Oral Q48H  . lisinopril  20 mg Oral Daily  . loratadine  10 mg Oral Daily  . magnesium oxide  800 mg Oral Daily  . methylPREDNISolone (SOLU-MEDROL) injection  40 mg Intravenous Q6H  . metoprolol tartrate  12.5 mg Oral BID  . multivitamin with minerals  1 tablet Oral Daily  . potassium chloride  40 mEq Oral BID  . warfarin  2.5 mg Oral q1800  . Warfarin - Pharmacist Dosing Inpatient   Does not apply q1800   Infusions:    PRN: acetaminophen **OR** acetaminophen, albuterol, ondansetron **OR** ondansetron (ZOFRAN) IV, sodium chloride Anti-infectives    Start     Dose/Rate Route Frequency Ordered Stop   01/23/15 0900  levofloxacin (LEVAQUIN) tablet 750 mg  Status:  Discontinued     750 mg Oral  Every 48 hours 01/21/15 1028 01/22/15 1130   01/23/15 0900  levofloxacin (LEVAQUIN) tablet 500 mg     500 mg Oral Every 48 hours 01/22/15 1130     01/22/15 1800  piperacillin-tazobactam (ZOSYN) IVPB 3.375 g  Status:  Discontinued     3.375 g 12.5 mL/hr over 240 Minutes Intravenous Every 12 hours 01/22/15 1130 01/22/15 1414   01/22/15 1430  cefTRIAXone (ROCEPHIN) 2 g in dextrose 5 % 50 mL IVPB - Premix     2 g 100 mL/hr over 30 Minutes Intravenous Every 24 hours 01/22/15 1415     01/22/15 1400  vancomycin (VANCOCIN) IVPB 750 mg/150 ml premix  Status:  Discontinued     750 mg 150 mL/hr over 60 Minutes Intravenous Every 36 hours 01/21/15 1438 01/22/15 1414   01/21/15 1400  piperacillin-tazobactam (ZOSYN) IVPB 3.375 g  Status:  Discontinued     3.375 g 12.5 mL/hr over 240 Minutes Intravenous 3 times per day 01/21/15 1152 01/22/15 1130   01/21/15 1300  vancomycin (VANCOCIN) IVPB 1000 mg/200 mL premix     1,000 mg 200 mL/hr over 60 Minutes Intravenous  Once 01/21/15 1152 01/21/15 1500   01/21/15 1000  levofloxacin (LEVAQUIN) IVPB 500 mg  Status:  Discontinued     500 mg 100 mL/hr over 60 Minutes Intravenous Every 48 hours 01/20/15 1530 01/21/15 1028   01/19/15 1800  levofloxacin (LEVAQUIN) IVPB 750 mg  Status:  Discontinued     750 mg 100 mL/hr over 90 Minutes Intravenous Every 48 hours 01/17/15 2249 01/20/15 1530   01/19/15 0200  vancomycin (VANCOCIN) IVPB 1000 mg/200 mL premix  Status:  Discontinued     1,000 mg 200 mL/hr over 60 Minutes Intravenous Every 36 hours 01/18/15 1259 01/18/15 1300   01/18/15 1400  piperacillin-tazobactam (ZOSYN) IVPB 3.375 g  Status:  Discontinued     3.375 g 12.5 mL/hr over 240 Minutes Intravenous 3 times per day 01/18/15 1259 01/19/15 1307   01/18/15 1400  vancomycin (VANCOCIN) IVPB 1000 mg/200 mL premix  Status:  Discontinued     1,000 mg 200 mL/hr over 60 Minutes Intravenous Every 36 hours 01/18/15 1300 01/19/15 1307   01/18/15 1330  vancomycin (VANCOCIN)  IVPB 750 mg/150 ml premix  Status:  Discontinued     750 mg 150 mL/hr over 60 Minutes Intravenous  Once 01/18/15 1259 01/18/15 1300   01/17/15 1945  levofloxacin (LEVAQUIN) IVPB 750 mg     750 mg 100 mL/hr  over 90 Minutes Intravenous  Once 01/17/15 1940 01/17/15 2155   01/17/15 1900  vancomycin (VANCOCIN) IVPB 1000 mg/200 mL premix     1,000 mg 200 mL/hr over 60 Minutes Intravenous  Once 01/17/15 1851 01/18/15 0140   01/17/15 1900  piperacillin-tazobactam (ZOSYN) IVPB 3.375 g     3.375 g 12.5 mL/hr over 240 Minutes Intravenous  Once 01/17/15 1851 01/17/15 1930     Patient on coumadin for afib as an outpatient, most recently taking 5mg  daily.   INR Coumadin Dose  5/9 1.67 -  5/10  2mg   5/11 1.75 2mg   5/12 1.40 2mg   5/13 1.55 5mg   5/14 1.79 4mg   5/15 2.70 3 mg   5/16 2.45 3 mg   5/17 2.27 4 mg   5/18 2.57       Assessment: Therapeutic but treating upward.   Goal of Therapy:  INR 2-3   Plan:  Will  Decrease to  warfarin 2.5 mg PO daily. Recheck INR with AM labs  Pharmacy to follow per consult  Larene Beach, PharmD   01/26/2015,9:50 AM

## 2015-01-27 LAB — BASIC METABOLIC PANEL
ANION GAP: 10 (ref 5–15)
BUN: 47 mg/dL — AB (ref 6–20)
CO2: 42 mmol/L — ABNORMAL HIGH (ref 22–32)
CREATININE: 1.4 mg/dL — AB (ref 0.44–1.00)
Calcium: 9.4 mg/dL (ref 8.9–10.3)
Chloride: 89 mmol/L — ABNORMAL LOW (ref 101–111)
GFR calc non Af Amer: 31 mL/min — ABNORMAL LOW (ref >60)
GFR, EST AFRICAN AMERICAN: 36 mL/min — AB (ref >60)
Glucose, Bld: 130 mg/dL — ABNORMAL HIGH (ref 65–99)
Potassium: 4.8 mmol/L (ref 3.5–5.1)
Sodium: 141 mmol/L (ref 135–145)

## 2015-01-27 LAB — PROTIME-INR
INR: 2.66
Prothrombin Time: 28.4 seconds — ABNORMAL HIGH (ref 11.4–15.0)

## 2015-01-27 LAB — HEMOGLOBIN: Hemoglobin: 9 g/dL — ABNORMAL LOW (ref 12.0–16.0)

## 2015-01-27 MED ORDER — FUROSEMIDE 20 MG PO TABS
20.0000 mg | ORAL_TABLET | Freq: Every day | ORAL | Status: DC
  Administered 2015-01-27 – 2015-01-28 (×2): 20 mg via ORAL
  Filled 2015-01-27 (×2): qty 1

## 2015-01-27 MED ORDER — LEVOFLOXACIN 500 MG PO TABS: 500.0000 mg | ORAL_TABLET | ORAL | Status: DC

## 2015-01-27 MED ORDER — POLYETHYLENE GLYCOL 3350 17 G PO PACK
17.0000 g | PACK | Freq: Once | ORAL | Status: AC
  Administered 2015-01-27: 17 g via ORAL
  Filled 2015-01-27: qty 1

## 2015-01-27 MED ORDER — POTASSIUM CHLORIDE CRYS ER 20 MEQ PO TBCR
20.0000 meq | EXTENDED_RELEASE_TABLET | Freq: Every day | ORAL | Status: DC
  Administered 2015-01-28: 20 meq via ORAL
  Filled 2015-01-27: qty 1

## 2015-01-27 MED ORDER — WARFARIN SODIUM 1 MG PO TABS
2.0000 mg | ORAL_TABLET | Freq: Every day | ORAL | Status: DC
  Administered 2015-01-27: 2 mg via ORAL
  Filled 2015-01-27: qty 2

## 2015-01-27 MED ORDER — CEFTRIAXONE SODIUM IN DEXTROSE 40 MG/ML IV SOLN
2.0000 g | INTRAVENOUS | Status: DC
  Administered 2015-01-27: 2 g via INTRAVENOUS
  Filled 2015-01-27 (×2): qty 50

## 2015-01-27 NOTE — Progress Notes (Signed)
Patient ID: Jill Ellison, female   DOB: March 13, 1917, 81 y.o.   MRN: [id number] [facility] PROGRESS NOTE  PCP: Margarita Rana, MD  HPI/Subjective: The patient states that she still feels good today. Excited about going home tomorrow. Still feels weak. No cough and no shortness of breath.  Objective: Filed Vitals:   01/27/15 1104  BP: 119/60  Pulse: 86  Temp: 98.3 F (36.8 C)  Resp: 21    Intake/Output Summary (Last 24 hours) at 01/27/15 1114 Last data filed at 01/27/15 0830  Gross per 24 hour  Intake    170 ml  Output   2200 ml  Net  -2030 ml   Filed Weights   01/25/15 0419 01/26/15 0504 01/27/15 0657  Weight: 58.01 kg (127 lb 14.2 oz) 57.017 kg (125 lb 11.2 oz) 58.54 kg (129 lb 0.9 oz)    ROS: Review of Systems  Constitutional: Negative for fever and chills.  Eyes: Negative for blurred vision.  Respiratory: Negative for cough and shortness of breath.   Cardiovascular: Negative for chest pain.  Gastrointestinal: Positive for constipation. Negative for nausea, vomiting, abdominal pain and diarrhea.  Genitourinary: Negative for dysuria.  Musculoskeletal: Negative for joint pain.  Neurological: Negative for dizziness and headaches.   Exam: Physical Exam  HENT:  Nose: No mucosal edema.  Mouth/Throat: No oropharyngeal exudate or posterior oropharyngeal edema.  Eyes: Conjunctivae, EOM and lids are normal. Pupils are equal, round, and reactive to light.  Neck: No JVD present. Carotid bruit is not present. No edema present. No thyroid mass and no thyromegaly present.  Cardiovascular: Normal rate, S1 normal and S2 normal.  An irregularly irregular rhythm present. Exam reveals no gallop.   No murmur heard. Pulses:      Dorsalis pedis pulses are 2+ on the right side, and 2+ on the left side.  Respiratory: No respiratory distress. She has decreased breath sounds in the right lower field and the left lower field. She has no wheezes. She has no rhonchi. She has  no rales.  Better air entry today.  GI: Soft. Bowel sounds are normal. There is no tenderness.  Lymphadenopathy:    She has no cervical adenopathy.  Neurological: She is alert. No cranial nerve deficit.  Skin: Skin is warm. No rash noted. Nails show no clubbing.  Psychiatric: She has a normal mood and affect.    Data Reviewed: Basic Metabolic Panel:  Recent Labs Lab 01/22/15 0515 01/23/15 0438 01/24/15 0441 01/25/15 1250 01/26/15 0429 01/27/15 0405  NA 142  --  141 141 141 141  K 3.2*  --  2.8* 4.4 4.5 4.8  CL 110  --  107 102 101 89*  CO2 28  --  30 33* 35* 42*  GLUCOSE 102*  --  99 159* 150* 130*  BUN 26*  --  27* 34* 42* 47*  CREATININE 1.30* 1.62* 1.54* 1.48* 1.50* 1.40*  CALCIUM 8.8*  --  9.4 9.3 9.2 9.4    CBC:  Recent Labs Lab 01/21/15 0511 01/22/15 0515 01/27/15 0405  WBC 7.1 6.9  --   HGB 8.5* 9.0* 9.0*  HCT 26.6* 28.4*  --   MCV 97.5 97.1  --   PLT 354  DUPLICATE REQUEST 562  --      Scheduled Meds: . calcium-vitamin D  1 tablet Oral BID  . cefTRIAXone (ROCEPHIN)  IV  2 g Intravenous Q24H  . cholecalciferol  1,000 Units Oral Daily  . diltiazem  120 mg Oral Daily  .  fluticasone  2 spray Each Nare Daily  . furosemide  20 mg Oral Daily  . ipratropium-albuterol  3 mL Nebulization QID  . letrozole  2.5 mg Oral Daily  . lisinopril  20 mg Oral Daily  . loratadine  10 mg Oral Daily  . magnesium oxide  800 mg Oral Daily  . metoprolol tartrate  12.5 mg Oral BID  . multivitamin with minerals  1 tablet Oral Daily  . [START ON 01/28/2015] potassium chloride  20 mEq Oral Daily  . warfarin  2 mg Oral q1800  . Warfarin - Pharmacist Dosing Inpatient   Does not apply q1800    Assessment/Plan:  1. Clinical sepsis, Streptococcus pneumoniae in blood culture on May 9, pneumonia left upper lobe and possibly lower lobe. The Streptococcus pneumonia continue Rocephin and Levaquin finished. 2. Rapid atrial fibrillation. Heart rate in satisfactory range today on  Cardizem and metoprolol. 3. Acute respiratory failure with hypoxia - patient on 2 L nasal cannula today and I still think we can titrate this down further. I think this was all fluid overload from IV fluids and antibiotics. S switched to oral Lasix for tomorrow. Received dose of IV Lasix today. Anticipate discharge home with hospice tomorrow morning. Will qualify for oxygen. 4. Acute renal failure on chronic kidney disease. Continue to monitor with diuresis 5. Hypokalemia: Improved with replacement. 6. History of breast cancer on letrozole. 7. Hypertension essential blood pressure stable on usual medications 8. Anemia likely dilutional with IV fluid hydration. 9. Prescription written for hospice nurse: Oxygen prescription, walker, wheelchair and hospital bed.  Code Status:     Code Status Orders        Start     Ordered   01/17/15 2038  Full code   Continuous     01/17/15 2038     Family discussion: Spoke with granddaughter yesterday about discharge Friday morning. Disposition Plan: Discharge home with hospice Friday morning.  Time spent: 20 minutes.  Loletha Grayer  [facility]

## 2015-01-27 NOTE — Care Management (Signed)
It is reported to CM that family  has verbalized that patient will be changed to DNR.  This has not been confirmed.  Discussed with primary nurse that when family arrives on unit to discuss and if wish to proceed with DNR,  inform attending.  Provided scripts for DME to Manuela Schwartz with [place], bed, walker, w/c.  Anticipate discharge 5/20 to home

## 2015-01-27 NOTE — Progress Notes (Signed)
Jill Ellison verified that pt is DNR from [name]. MD Earleen Newport was notified and order was placed.

## 2015-01-27 NOTE — Clinical Social Work Note (Signed)
Pt is refusing SNF placement.  RNCM notified and has been working to set up home health for pt and family.  CSW signing off.

## 2015-01-27 NOTE — Progress Notes (Signed)
VSS. Wean to 2 L of oyxgen. A fib. Takes meds ok. Up to chair for 3 hours and tolerated it well. But refused to ambulate in the hallway. Pt has not reported any pain. Miralax was given for costipation. Up to [facility] with 1 assist and tolerated it well. D/c Friday home with hospice. Pt has no further concerns at this time.

## 2015-01-27 NOTE — Progress Notes (Signed)
[place] for Coumadin Indication: atrial fibrillation  No Known Allergies  Patient Measurements: Height: 5\' 1"  (154.9 cm) Weight: 129 lb 0.9 oz (58.54 kg) IBW/kg (Calculated) : 47.8  Vital Signs: Temp: 98.3 F (36.8 C) (05/19 1104) BP: 119/60 mmHg (05/19 1104) Pulse Rate: 86 (05/19 1104)  Labs:  Recent Labs  01/25/15 0430 01/25/15 1250 01/26/15 0429 01/27/15 0405  HGB  --   --   --  9.0*  LABPROT 25.2*  --  27.7* 28.4*  INR 2.27  --  2.57 2.66  CREATININE  --  1.48* 1.50* 1.40*     Medical History: Past Medical History  Diagnosis Date  . Swelling of ankle   . SOB (shortness of breath)   . Hypertension   . Asthma   . Arthritis   . Atrial fibrillation 2011  . Cancer 2011    Left breast  . Cancer of leg   . Malignant neoplasm of upper-outer quadrant of female breast     left   . Memory loss   . Dementia   . GERD (gastroesophageal reflux disease)   . Hyperlipidemia   . Anemia   . Iron deficiency   . History of colonic polyps   . Lumbago     Medications:  Prescriptions prior to admission  Medication Sig Dispense Refill Last Dose  . calcium-vitamin D (OSCAL WITH D) 500-200 MG-UNIT per tablet Take 1 tablet by mouth 2 (two) times daily.     Past Month at Unknown time  . Cholecalciferol (VITAMIN D3) 1000 UNITS CAPS Take 1 capsule by mouth daily.     Past Month at Unknown time  . diltiazem (CARDIZEM CD) 120 MG 24 hr capsule Take 1 capsule (120 mg total) by mouth daily. 30 capsule 11 01/16/2015 at Unknown time  . furosemide (LASIX) 20 MG tablet TAKE 1 TABLET EVERY DAY 30 tablet 6 01/16/2015 at Unknown time  . KLOR-CON M20 20 MEQ tablet TAKE 1 TABLET BY MOUTH EVERY DAY 30 tablet 3 01/16/2015 at Unknown time  . letrozole (FEMARA) 2.5 MG tablet Take 2.5 mg by mouth daily.     01/16/2015 at Unknown time  . lisinopril (PRINIVIL,ZESTRIL) 20 MG tablet TAKE 1 TABLET BY MOUTH DAILY 30 tablet 6 01/16/2015 at Unknown time  . Magnesium 250 MG TABS  Take 2 tablets by mouth daily.    Past Month at Unknown time  . metoprolol succinate (TOPROL-XL) 25 MG 24 hr tablet Take 25 mg by mouth daily.   01/16/2015 at 1700  . Multiple Vitamin (MULTIVITAMIN) tablet Take 1 tablet by mouth daily.     Past Month at Unknown time  . warfarin (COUMADIN) 5 MG tablet Take 5 mg by mouth daily.   01/16/2015 at Unknown time  . fexofenadine (ALLEGRA) 180 MG tablet Take 180 mg by mouth daily as needed for allergies.    prn  . levalbuterol (XOPENEX HFA) 45 MCG/ACT inhaler Inhale 1-2 puffs into the lungs every 4 (four) hours as needed.     rescue  . PROAIR HFA 108 (90 BASE) MCG/ACT inhaler Inhale 2 puffs into the lungs every 6 (six) hours as needed for wheezing or shortness of breath.   2 rescue  . warfarin (COUMADIN) 2 MG tablet TAKE AS DIRECTED BY ANTICOAGULATION CLINIC (Patient not taking: Reported on 01/17/2015) 75 tablet 3 Taking   Scheduled:  . calcium-vitamin D  1 tablet Oral BID  . cefTRIAXone (ROCEPHIN)  IV  2 g Intravenous Q24H  . cholecalciferol  1,000 Units Oral Daily  . diltiazem  120 mg Oral Daily  . fluticasone  2 spray Each Nare Daily  . furosemide  20 mg Oral Daily  . ipratropium-albuterol  3 mL Nebulization QID  . letrozole  2.5 mg Oral Daily  . lisinopril  20 mg Oral Daily  . loratadine  10 mg Oral Daily  . magnesium oxide  800 mg Oral Daily  . metoprolol tartrate  12.5 mg Oral BID  . multivitamin with minerals  1 tablet Oral Daily  . [START ON 01/28/2015] potassium chloride  20 mEq Oral Daily  . warfarin  2 mg Oral q1800  . Warfarin - Pharmacist Dosing Inpatient   Does not apply q1800   Infusions:    PRN: acetaminophen **OR** acetaminophen, albuterol, ondansetron **OR** ondansetron (ZOFRAN) IV, sodium chloride Anti-infectives    Start     Dose/Rate Route Frequency Ordered Stop   01/29/15 1000  levofloxacin (LEVAQUIN) tablet 500 mg  Status:  Discontinued     500 mg Oral Every 48 hours 01/27/15 1058 01/27/15 1113   01/27/15 1800  cefTRIAXone  (ROCEPHIN) 2 g in dextrose 5 % 50 mL IVPB - Premix     2 g 100 mL/hr over 30 Minutes Intravenous Every 24 hours 01/27/15 1058     01/23/15 0900  levofloxacin (LEVAQUIN) tablet 750 mg  Status:  Discontinued     750 mg Oral Every 48 hours 01/21/15 1028 01/22/15 1130   01/23/15 0900  levofloxacin (LEVAQUIN) tablet 500 mg  Status:  Discontinued     500 mg Oral Every 48 hours 01/22/15 1130 01/27/15 1058   01/22/15 1800  piperacillin-tazobactam (ZOSYN) IVPB 3.375 g  Status:  Discontinued     3.375 g 12.5 mL/hr over 240 Minutes Intravenous Every 12 hours 01/22/15 1130 01/22/15 1414   01/22/15 1430  cefTRIAXone (ROCEPHIN) 2 g in dextrose 5 % 50 mL IVPB - Premix  Status:  Discontinued     2 g 100 mL/hr over 30 Minutes Intravenous Every 24 hours 01/22/15 1415 01/27/15 1058   01/22/15 1400  vancomycin (VANCOCIN) IVPB 750 mg/150 ml premix  Status:  Discontinued     750 mg 150 mL/hr over 60 Minutes Intravenous Every 36 hours 01/21/15 1438 01/22/15 1414   01/21/15 1400  piperacillin-tazobactam (ZOSYN) IVPB 3.375 g  Status:  Discontinued     3.375 g 12.5 mL/hr over 240 Minutes Intravenous 3 times per day 01/21/15 1152 01/22/15 1130   01/21/15 1300  vancomycin (VANCOCIN) IVPB 1000 mg/200 mL premix     1,000 mg 200 mL/hr over 60 Minutes Intravenous  Once 01/21/15 1152 01/21/15 1500   01/21/15 1000  levofloxacin (LEVAQUIN) IVPB 500 mg  Status:  Discontinued     500 mg 100 mL/hr over 60 Minutes Intravenous Every 48 hours 01/20/15 1530 01/21/15 1028   01/19/15 1800  levofloxacin (LEVAQUIN) IVPB 750 mg  Status:  Discontinued     750 mg 100 mL/hr over 90 Minutes Intravenous Every 48 hours 01/17/15 2249 01/20/15 1530   01/19/15 0200  vancomycin (VANCOCIN) IVPB 1000 mg/200 mL premix  Status:  Discontinued     1,000 mg 200 mL/hr over 60 Minutes Intravenous Every 36 hours 01/18/15 1259 01/18/15 1300   01/18/15 1400  piperacillin-tazobactam (ZOSYN) IVPB 3.375 g  Status:  Discontinued     3.375 g 12.5 mL/hr  over 240 Minutes Intravenous 3 times per day 01/18/15 1259 01/19/15 1307   01/18/15 1400  vancomycin (VANCOCIN) IVPB 1000 mg/200 mL premix  Status:  Discontinued     1,000 mg 200 mL/hr over 60 Minutes Intravenous Every 36 hours 01/18/15 1300 01/19/15 1307   01/18/15 1330  vancomycin (VANCOCIN) IVPB 750 mg/150 ml premix  Status:  Discontinued     750 mg 150 mL/hr over 60 Minutes Intravenous  Once 01/18/15 1259 01/18/15 1300   01/17/15 1945  levofloxacin (LEVAQUIN) IVPB 750 mg     750 mg 100 mL/hr over 90 Minutes Intravenous  Once 01/17/15 1940 01/17/15 2155   01/17/15 1900  vancomycin (VANCOCIN) IVPB 1000 mg/200 mL premix     1,000 mg 200 mL/hr over 60 Minutes Intravenous  Once 01/17/15 1851 01/18/15 0140   01/17/15 1900  piperacillin-tazobactam (ZOSYN) IVPB 3.375 g     3.375 g 12.5 mL/hr over 240 Minutes Intravenous  Once 01/17/15 1851 01/17/15 1930     Patient on coumadin for afib as an outpatient, most recently taking 5mg  daily.   INR Coumadin Dose  5/9 1.67 -  5/10  2mg   5/11 1.75 2mg   5/12 1.40 2mg   5/13 1.55 5mg   5/14 1.79 4mg   5/15 2.70 3 mg   5/16 2.45 3 mg   5/17 2.27 4 mg   5/18 2.57       Assessment: Therapeutic but treating upward. Patient's levofloxacin discontinued today. Patient remains on ceftriaxone.    Goal of Therapy:  INR 2-3   Plan:  Will resume home dose of warfarin 2mg  daily. Will obtain INR with am labs.    Pharmacy will continue to monitor and adjust per consult.    Currie Paris, PharmD   01/27/2015,4:19 PM

## 2015-01-27 NOTE — Progress Notes (Signed)
MD Earleen Newport was made aware of 15 beat run of SVT, will follow.

## 2015-01-27 NOTE — Progress Notes (Signed)
Physical Therapy Treatment Patient Details Name: Jill Ellison MRN: [id number] DOB: Jan 30, 1917 Today's Date: 01/27/2015    History of Present Illness Pt is a pleasant 81 year old female who was admitted for sepsis and pnemonia. Pt with complaints of fever and SOB and is now on 5L of O2.    PT Comments    Pt is making good progress towards goals, however is limited secondary to fatigue. Pt agreeable to there-ex this date with good endurance. 1 rest break required secondary to fatigue. On 3L of O2 this date with no SOB symptoms noted.  Follow Up Recommendations   (home with hospice and 24/7 care)     Equipment Recommendations  Rolling walker with 5" wheels;Wheelchair (measurements PT)    Recommendations for Other Services       Precautions / Restrictions Precautions Precautions: Fall Restrictions Weight Bearing Restrictions: No    Mobility  Bed Mobility               General bed mobility comments: not performed this date as pt pleasantly refusing  Transfers                    Ambulation/Gait                 Stairs            Wheelchair Mobility    Modified Rankin (Stroke Patients Only)       Balance                                    Cognition Arousal/Alertness: Awake/alert Behavior During Therapy: WFL for tasks assessed/performed Overall Cognitive Status: Within Functional Limits for tasks assessed                      Exercises Other Exercises Other Exercises: Pt performed supine ther-ex including B LE ankle pumps, quad sets, heel slides, hip abd/add, shoulder raises, and scap squeezes x 12 reps with cga for sequencing. Pt with O2 sats at 99% during all exertion. On 3L of O2.    General Comments        Pertinent Vitals/Pain Pain Assessment: No/denies pain    Home Living                      Prior Function            PT Goals (current goals can now be found in the care plan section)  Acute Rehab PT Goals Patient Stated Goal: to get stronger PT Goal Formulation: With patient Time For Goal Achievement: 02/02/15 Potential to Achieve Goals: Good Progress towards PT goals: Progressing toward goals    Frequency  Min 2X/week    PT Plan Discharge plan needs to be updated    Co-evaluation             End of Session Equipment Utilized During Treatment: Oxygen Activity Tolerance: Patient limited by fatigue Patient left: in bed;with bed alarm set     Time: 1539-1550 PT Time Calculation (min) (ACUTE ONLY): 11 min  Charges:  $Therapeutic Exercise: 8-22 mins                    G Codes:     Jill Ellison, PT, DPT [phone]  Jill Ellison 01/27/2015, 4:12 PM

## 2015-01-28 LAB — BASIC METABOLIC PANEL
Anion gap: 8 (ref 5–15)
BUN: 47 mg/dL — ABNORMAL HIGH (ref 6–20)
CALCIUM: 9.1 mg/dL (ref 8.9–10.3)
CO2: 43 mmol/L — ABNORMAL HIGH (ref 22–32)
Chloride: 87 mmol/L — ABNORMAL LOW (ref 101–111)
Creatinine, Ser: 1.51 mg/dL — ABNORMAL HIGH (ref 0.44–1.00)
GFR, EST AFRICAN AMERICAN: 32 mL/min — AB (ref >60)
GFR, EST NON AFRICAN AMERICAN: 28 mL/min — AB (ref >60)
GLUCOSE: 85 mg/dL (ref 65–99)
Potassium: 4.2 mmol/L (ref 3.5–5.1)
SODIUM: 138 mmol/L (ref 135–145)

## 2015-01-28 LAB — PROTIME-INR
INR: 2.25
Prothrombin Time: 25 seconds — ABNORMAL HIGH (ref 11.4–15.0)

## 2015-01-28 MED ORDER — FLUTICASONE PROPIONATE 50 MCG/ACT NA SUSP: 2.0000 | Freq: Every day | NASAL | Status: AC

## 2015-01-28 MED ORDER — METOPROLOL TARTRATE 25 MG PO TABS: 12.5000 mg | ORAL_TABLET | Freq: Two times a day (BID) | ORAL | Status: AC

## 2015-01-28 MED ORDER — WARFARIN SODIUM 1 MG PO TABS: 3.0000 mg | ORAL_TABLET | Freq: Every day | ORAL | Status: DC

## 2015-01-28 NOTE — Care Management (Signed)
Equipment has been delivered to patient's home.  Notified primary nurse

## 2015-01-28 NOTE — Progress Notes (Signed)
Notified Dr. Earleen Newport of patient's low blood pressure after taking morning medications. MD stated that the blood pressure is fine and medications will stay the same. Discharge instructions given to granddaughter. IV and telemetry removed. Prescriptions sent to [facility] pharmacy and granddaughter will be picking them up. Hospice is working on getting hospital bed and oxygen delivered to home before patient can leave the hospital. Notified that equipment will not arrive until after 3PM. Hospice coordinator is to call RN once equipment is delivered, then EMS will be called.

## 2015-01-28 NOTE — Progress Notes (Signed)
[facility] for Coumadin Indication: atrial fibrillation  No Known Allergies  Patient Measurements: Height: 5\' 1"  (154.9 cm) Weight: 121 lb 3.2 oz (54.976 kg) IBW/kg (Calculated) : 47.8  Vital Signs: Temp: 97.7 F (36.5 C) (05/20 0511) Temp Source: Oral (05/20 0511) BP: 137/87 mmHg (05/20 0511) Pulse Rate: 63 (05/20 0511)  Labs:  Recent Labs  01/26/15 0429 01/27/15 0405 01/28/15 0542  HGB  --  9.0*  --   LABPROT 27.7* 28.4* 25.0*  INR 2.57 2.66 2.25  CREATININE 1.50* 1.40* 1.51*     Medical History: Past Medical History  Diagnosis Date  . Swelling of ankle   . SOB (shortness of breath)   . Hypertension   . Asthma   . Arthritis   . Atrial fibrillation 2011  . Cancer 2011    Left breast  . Cancer of leg   . Malignant neoplasm of upper-outer quadrant of female breast     left   . Memory loss   . Dementia   . GERD (gastroesophageal reflux disease)   . Hyperlipidemia   . Anemia   . Iron deficiency   . History of colonic polyps   . Lumbago     Medications:  Prescriptions prior to admission  Medication Sig Dispense Refill Last Dose  . calcium-vitamin D (OSCAL WITH D) 500-200 MG-UNIT per tablet Take 1 tablet by mouth 2 (two) times daily.     Past Month at Unknown time  . Cholecalciferol (VITAMIN D3) 1000 UNITS CAPS Take 1 capsule by mouth daily.     Past Month at Unknown time  . diltiazem (CARDIZEM CD) 120 MG 24 hr capsule Take 1 capsule (120 mg total) by mouth daily. 30 capsule 11 01/16/2015 at Unknown time  . furosemide (LASIX) 20 MG tablet TAKE 1 TABLET EVERY DAY 30 tablet 6 01/16/2015 at Unknown time  . KLOR-CON M20 20 MEQ tablet TAKE 1 TABLET BY MOUTH EVERY DAY 30 tablet 3 01/16/2015 at Unknown time  . letrozole (FEMARA) 2.5 MG tablet Take 2.5 mg by mouth daily.     01/16/2015 at Unknown time  . lisinopril (PRINIVIL,ZESTRIL) 20 MG tablet TAKE 1 TABLET BY MOUTH DAILY 30 tablet 6 01/16/2015 at Unknown time  . Magnesium 250 MG TABS  Take 2 tablets by mouth daily.    Past Month at Unknown time  . metoprolol succinate (TOPROL-XL) 25 MG 24 hr tablet Take 25 mg by mouth daily.   01/16/2015 at 1700  . Multiple Vitamin (MULTIVITAMIN) tablet Take 1 tablet by mouth daily.     Past Month at Unknown time  . warfarin (COUMADIN) 5 MG tablet Take 5 mg by mouth daily.   01/16/2015 at Unknown time  . fexofenadine (ALLEGRA) 180 MG tablet Take 180 mg by mouth daily as needed for allergies.    prn  . levalbuterol (XOPENEX HFA) 45 MCG/ACT inhaler Inhale 1-2 puffs into the lungs every 4 (four) hours as needed.     rescue  . PROAIR HFA 108 (90 BASE) MCG/ACT inhaler Inhale 2 puffs into the lungs every 6 (six) hours as needed for wheezing or shortness of breath.   2 rescue  . warfarin (COUMADIN) 2 MG tablet TAKE AS DIRECTED BY ANTICOAGULATION CLINIC (Patient not taking: Reported on 01/17/2015) 75 tablet 3 Taking   Scheduled:  . calcium-vitamin D  1 tablet Oral BID  . cefTRIAXone (ROCEPHIN)  IV  2 g Intravenous Q24H  . cholecalciferol  1,000 Units Oral Daily  . diltiazem  120  mg Oral Daily  . fluticasone  2 spray Each Nare Daily  . furosemide  20 mg Oral Daily  . ipratropium-albuterol  3 mL Nebulization QID  . letrozole  2.5 mg Oral Daily  . lisinopril  20 mg Oral Daily  . loratadine  10 mg Oral Daily  . magnesium oxide  800 mg Oral Daily  . metoprolol tartrate  12.5 mg Oral BID  . multivitamin with minerals  1 tablet Oral Daily  . potassium chloride  20 mEq Oral Daily  . warfarin  2 mg Oral q1800  . Warfarin - Pharmacist Dosing Inpatient   Does not apply q1800   Infusions:    PRN: acetaminophen **OR** acetaminophen, albuterol, ondansetron **OR** ondansetron (ZOFRAN) IV, sodium chloride Anti-infectives    Start     Dose/Rate Route Frequency Ordered Stop   01/29/15 1000  levofloxacin (LEVAQUIN) tablet 500 mg  Status:  Discontinued     500 mg Oral Every 48 hours 01/27/15 1058 01/27/15 1113   01/27/15 1800  cefTRIAXone (ROCEPHIN) 2 g in  dextrose 5 % 50 mL IVPB - Premix     2 g 100 mL/hr over 30 Minutes Intravenous Every 24 hours 01/27/15 1058     01/23/15 0900  levofloxacin (LEVAQUIN) tablet 750 mg  Status:  Discontinued     750 mg Oral Every 48 hours 01/21/15 1028 01/22/15 1130   01/23/15 0900  levofloxacin (LEVAQUIN) tablet 500 mg  Status:  Discontinued     500 mg Oral Every 48 hours 01/22/15 1130 01/27/15 1058   01/22/15 1800  piperacillin-tazobactam (ZOSYN) IVPB 3.375 g  Status:  Discontinued     3.375 g 12.5 mL/hr over 240 Minutes Intravenous Every 12 hours 01/22/15 1130 01/22/15 1414   01/22/15 1430  cefTRIAXone (ROCEPHIN) 2 g in dextrose 5 % 50 mL IVPB - Premix  Status:  Discontinued     2 g 100 mL/hr over 30 Minutes Intravenous Every 24 hours 01/22/15 1415 01/27/15 1058   01/22/15 1400  vancomycin (VANCOCIN) IVPB 750 mg/150 ml premix  Status:  Discontinued     750 mg 150 mL/hr over 60 Minutes Intravenous Every 36 hours 01/21/15 1438 01/22/15 1414   01/21/15 1400  piperacillin-tazobactam (ZOSYN) IVPB 3.375 g  Status:  Discontinued     3.375 g 12.5 mL/hr over 240 Minutes Intravenous 3 times per day 01/21/15 1152 01/22/15 1130   01/21/15 1300  vancomycin (VANCOCIN) IVPB 1000 mg/200 mL premix     1,000 mg 200 mL/hr over 60 Minutes Intravenous  Once 01/21/15 1152 01/21/15 1500   01/21/15 1000  levofloxacin (LEVAQUIN) IVPB 500 mg  Status:  Discontinued     500 mg 100 mL/hr over 60 Minutes Intravenous Every 48 hours 01/20/15 1530 01/21/15 1028   01/19/15 1800  levofloxacin (LEVAQUIN) IVPB 750 mg  Status:  Discontinued     750 mg 100 mL/hr over 90 Minutes Intravenous Every 48 hours 01/17/15 2249 01/20/15 1530   01/19/15 0200  vancomycin (VANCOCIN) IVPB 1000 mg/200 mL premix  Status:  Discontinued     1,000 mg 200 mL/hr over 60 Minutes Intravenous Every 36 hours 01/18/15 1259 01/18/15 1300   01/18/15 1400  piperacillin-tazobactam (ZOSYN) IVPB 3.375 g  Status:  Discontinued     3.375 g 12.5 mL/hr over 240 Minutes  Intravenous 3 times per day 01/18/15 1259 01/19/15 1307   01/18/15 1400  vancomycin (VANCOCIN) IVPB 1000 mg/200 mL premix  Status:  Discontinued     1,000 mg 200 mL/hr over 60 Minutes  Intravenous Every 36 hours 01/18/15 1300 01/19/15 1307   01/18/15 1330  vancomycin (VANCOCIN) IVPB 750 mg/150 ml premix  Status:  Discontinued     750 mg 150 mL/hr over 60 Minutes Intravenous  Once 01/18/15 1259 01/18/15 1300   01/17/15 1945  levofloxacin (LEVAQUIN) IVPB 750 mg     750 mg 100 mL/hr over 90 Minutes Intravenous  Once 01/17/15 1940 01/17/15 2155   01/17/15 1900  vancomycin (VANCOCIN) IVPB 1000 mg/200 mL premix     1,000 mg 200 mL/hr over 60 Minutes Intravenous  Once 01/17/15 1851 01/18/15 0140   01/17/15 1900  piperacillin-tazobactam (ZOSYN) IVPB 3.375 g     3.375 g 12.5 mL/hr over 240 Minutes Intravenous  Once 01/17/15 1851 01/17/15 1930     Patient on coumadin for afib as an outpatient, most recently taking 5mg  daily.   INR Coumadin Dose  5/9 1.67 -  5/10  2mg   5/11 1.75 2mg   5/12 1.40 2mg   5/13 1.55 5mg   5/14 1.79 4mg   5/15 2.70 3 mg   5/16 2.45 3 mg   5/17 2.27 4 mg   5/18 2.57       Assessment: Therapeutic but treating downward.     Goal of Therapy:  INR 2-3   Plan:  Will increase dose to warfarin 3 mg PO daily. Will obtain INR with am labs.    Pharmacy will continue to monitor and adjust per consult.    Larene Beach, PharmD  01/28/2015,8:26 AM

## 2015-01-28 NOTE — Discharge Summary (Signed)
[facility] NAME: Jill Ellison    MR#:  [id number]  DATE OF BIRTH:  07/08/1917  DATE OF ADMISSION:  01/17/2015 ADMITTING PHYSICIAN: Lytle Butte, MD  DATE OF DISCHARGE: 01/28/2015, home with hospice.  PRIMARY CARE PHYSICIAN: Margarita Rana, MD    ADMISSION DIAGNOSIS:  Community acquired pneumonia [J18.9] Sepsis, due to unspecified organism [A41.9]  DISCHARGE DIAGNOSIS:  Principal Problem:   Sepsis Active Problems:   Community acquired pneumonia   Atrial fibrillation with rapid ventricular response   SECONDARY DIAGNOSIS:   Past Medical History  Diagnosis Date  . Swelling of ankle   . SOB (shortness of breath)   . Hypertension   . Asthma   . Arthritis   . Atrial fibrillation 2011  . Cancer 2011    Left breast  . Cancer of leg   . Malignant neoplasm of upper-outer quadrant of female breast     left   . Memory loss   . Dementia   . GERD (gastroesophageal reflux disease)   . Hyperlipidemia   . Anemia   . Iron deficiency   . History of colonic polyps   . Lumbago     HOSPITAL COURSE:   1. Sepsis, community-acquired pneumonia. Streptococcus pneumoniae grew out of initial blood cultures. Repeat blood cultures were negative. Patient initially placed on Levaquin and then triple antibiotics were ordered because the patient was not improving. When I saw the patient on 01/22/2015, I stopped the triple antibiotics and added Rocephin and The Levaquin. The patient finished the entire course of antibiotics here in the hospital. No antibiotics upon discharge. 2. Fluid overload, likely diastolic congestive heart failure. When I saw the patient on 01/22/2015, I stopped the IV fluids and triple antibiotics. I gave a dose of IV Lasix. I had to continue IV Lasix for a few days. Upon discharge right put her back to her usual 20 mg daily. I think the patient's decompensation during the hospital course was secondary to fluid  overload rather than sepsis. 3. Acute respiratory failure: I think this is worsened with the fluid overload. She required up to 5 L nasal cannula during the hospital course. She is now down to 2 L nasal cannula. Since she is given a go home with hospice care I can continue this level. 4. Rapid atrial fibrillation: The patient had episodes of rapid atrial fibrillation and SVT. Heart rate has fluctuated with bradycardia also. She is on Cardizem 120 mg by mouth daily and metoprolol 12.5 mg twice a day. Heart rate is acceptable at this level. Patient is on Coumadin for anticoagulation will go home on 2 mg daily INR checks through Dr. Christianne Dolin office. 5. Weakness patient will be discharged home with hospice. The family would like her home and not at rehabilitation. 6. Chronic kidney disease stage III continue to watch with diuresis. 7. Anemia likely dilutional with IV fluid hydration.  DISCHARGE CONDITIONS:   Fair  CONSULTS OBTAINED:  Treatment Team:  Lytle Butte, MD Flora Lipps, MD  DRUG ALLERGIES:  No Known Allergies  DISCHARGE MEDICATIONS:   Current Discharge Medication List    START taking these medications   Details  fluticasone (FLONASE) 50 MCG/ACT nasal spray Place 2 sprays into both nostrils daily. Qty: 16 g, Refills: 0    metoprolol tartrate (LOPRESSOR) 25 MG tablet Take 0.5 tablets (12.5 mg total) by mouth 2 (two) times daily. Qty: 60 tablet, Refills: 0      CONTINUE these  medications which have NOT CHANGED   Details  calcium-vitamin D (OSCAL WITH D) 500-200 MG-UNIT per tablet Take 1 tablet by mouth 2 (two) times daily.      Cholecalciferol (VITAMIN D3) 1000 UNITS CAPS Take 1 capsule by mouth daily.      diltiazem (CARDIZEM CD) 120 MG 24 hr capsule Take 1 capsule (120 mg total) by mouth daily. Qty: 30 capsule, Refills: 11    furosemide (LASIX) 20 MG tablet TAKE 1 TABLET EVERY DAY Qty: 30 tablet, Refills: 6    KLOR-CON M20 20 MEQ tablet TAKE 1 TABLET BY MOUTH EVERY  DAY Qty: 30 tablet, Refills: 3    letrozole (FEMARA) 2.5 MG tablet Take 2.5 mg by mouth daily.      lisinopril (PRINIVIL,ZESTRIL) 20 MG tablet TAKE 1 TABLET BY MOUTH DAILY Qty: 30 tablet, Refills: 6    Magnesium 250 MG TABS Take 2 tablets by mouth daily.     Multiple Vitamin (MULTIVITAMIN) tablet Take 1 tablet by mouth daily.      fexofenadine (ALLEGRA) 180 MG tablet Take 180 mg by mouth daily as needed for allergies.     levalbuterol (XOPENEX HFA) 45 MCG/ACT inhaler Inhale 1-2 puffs into the lungs every 4 (four) hours as needed.      warfarin (COUMADIN) 2 MG tablet TAKE AS DIRECTED BY ANTICOAGULATION CLINIC Qty: 75 tablet, Refills: 3      STOP taking these medications     metoprolol succinate (TOPROL-XL) 25 MG 24 hr tablet      PROAIR HFA 108 (90 BASE) MCG/ACT inhaler          DISCHARGE INSTRUCTIONS:   Follow-up home with hospice. Follow-up with Dr. Venia Minks one week.  If you experience worsening of your admission symptoms, develop shortness of breath, life threatening emergency, suicidal or homicidal thoughts you must seek medical attention immediately by calling 911 or calling your MD immediately  if symptoms less severe.  You Must read complete instructions/literature along with all the possible adverse reactions/side effects for all the Medicines you take and that have been prescribed to you. Take any new Medicines after you have completely understood and accept all the possible adverse reactions/side effects.   Please note  You were cared for by a hospitalist during your hospital stay. If you have any questions about your discharge medications or the care you received while you were in the hospital after you are discharged, you can call the unit and asked to speak with the hospitalist on call if the hospitalist that took care of you is not available. Once you are discharged, your primary care physician will handle any further medical issues. Please note that NO REFILLS  for any discharge medications will be authorized once you are discharged, as it is imperative that you return to your primary care physician (or establish a relationship with a primary care physician if you do not have one) for your aftercare needs so that they can reassess your need for medications and monitor your lab values.    Today   CHIEF COMPLAINT:   Chief Complaint  Patient presents with  . Emesis    HISTORY OF PRESENT ILLNESS:  Jill Ellison  is a 81 y.o. female presented with shortness of breath and found to have pneumonia and was admitted with sepsis.   VITAL SIGNS:  Blood pressure 137/87, pulse 63, temperature 97.7 F (36.5 C), temperature source Oral, resp. rate 2, height 5\' 1"  (1.549 m), weight 54.976 kg (121 lb 3.2 oz), SpO2 90 %.  I/O:   Intake/Output Summary (Last 24 hours) at 01/28/15 0748 Last data filed at 01/28/15 0514  Gross per 24 hour  Intake    120 ml  Output   2100 ml  Net  -1980 ml    PHYSICAL EXAMINATION:  GENERAL:  81 y.o.-year-old patient lying in the bed with no acute distress.  EYES: Pupils equal, round, reactive to light and accommodation. No scleral icterus. Extraocular muscles intact.  HEENT: Head atraumatic, normocephalic. Oropharynx and nasopharynx clear.  NECK:  Supple, no jugular venous distention. No thyroid enlargement, no tenderness.  LUNGS: Normal breath sounds bilaterally, no wheezing, rales,rhonchi or crepitation. No use of accessory muscles of respiration.  CARDIOVASCULAR: S1, S2 irregularly irregular. 2/6 systolic murmurs, no rubs, or gallops.  ABDOMEN: Soft, non-tender, non-distended. Bowel sounds present. No organomegaly or mass.  EXTREMITIES: No pedal edema, cyanosis, or clubbing.  NEUROLOGIC: Cranial nerves II through XII are intact. Muscle strength 5/5 in all extremities. Sensation intact. Gait not checked.  PSYCHIATRIC: The patient is alert and oriented x 3.  SKIN: No obvious rash, lesion, or ulcer.   DATA REVIEW:    CBC  Recent Labs Lab 01/22/15 0515 01/27/15 0405  WBC 6.9  --   HGB 9.0* 9.0*  HCT 28.4*  --   PLT 171  --     Chemistries   Recent Labs Lab 01/28/15 0542  NA 138  K 4.2  CL 87*  CO2 43*  GLUCOSE 85  BUN 47*  CREATININE 1.51*  CALCIUM 9.1    Management plans discussed with the patient, family and they are in agreement.  CODE STATUS:     Code Status Orders        Start     Ordered   01/27/15 2355  Do not attempt resuscitation (DNR)   Continuous    Question Answer Comment  In the event of cardiac or respiratory ARREST Do not call a "code blue"   In the event of cardiac or respiratory ARREST Do not perform Intubation, CPR, defibrillation or ACLS   In the event of cardiac or respiratory ARREST Use medication by any route, position, wound care, and other measures to relive pain and suffering. May use oxygen, suction and manual treatment of airway obstruction as needed for comfort.      01/27/15 1548      TOTAL TIME TAKING CARE OF THIS PATIENT: 40 minutes.    Loletha Grayer M.D on 01/28/2015 at 7:48 AM  Between 7am to 6pm - Pager - [phone]  After 6pm go to [email or web address] - password [email or web address] [facility]  Office  [phone]  CC: Primary care physician; Margarita Rana, MD

## 2015-01-28 NOTE — Progress Notes (Signed)
EMS arrived to transport patient. Hospice coordinator Jill Ellison notified that patient is on her way home.

## 2015-02-01 DIAGNOSIS — I509 Heart failure, unspecified: Secondary | ICD-10-CM | POA: Diagnosis not present

## 2015-02-01 DIAGNOSIS — J45909 Unspecified asthma, uncomplicated: Secondary | ICD-10-CM | POA: Diagnosis not present

## 2015-02-01 DIAGNOSIS — I10 Essential (primary) hypertension: Secondary | ICD-10-CM | POA: Diagnosis not present

## 2015-02-01 DIAGNOSIS — M1990 Unspecified osteoarthritis, unspecified site: Secondary | ICD-10-CM | POA: Diagnosis not present

## 2015-02-10 ENCOUNTER — Ambulatory Visit: Payer: Medicaid Other | Admitting: Cardiovascular Disease

## 2015-02-10 ENCOUNTER — Telehealth: Payer: Self-pay

## 2015-02-10 NOTE — Telephone Encounter (Signed)
Pt granddaughter called, states the hospice nurse took her off of coumadin yesterday, and she is worried that "something will happen to her".

## 2015-02-10 NOTE — Telephone Encounter (Signed)
Spoke w/ pt's granddaughter.  She states that pt was d/c'd from [facility] on 01/28/15 w/ hospice home care and that the hospice nurse told her "to throw out all of her coumadin b/c she would not need it anymore".  She is concerned, as pt was given discharge instructions of decreasing coumadin to 3 mg daily and that pt has been on coumadin for years.  Discussed w/ her that hospice primarily gives end of life care and ensures pt's comfort rather than further treatment, but I am not aware of their protocols on blood thinners.  Advised her to contact hospice and make sure that an MD ordered pt's coumadin d/c'd and that is correct.  She is appreciative of the call and will let us know if we can be of further assistance.

## 2015-02-23 ENCOUNTER — Emergency Department: Payer: Medicare Other

## 2015-02-23 ENCOUNTER — Inpatient Hospital Stay
Admission: EM | Admit: 2015-02-23 | Discharge: 2015-03-11 | DRG: 394 | Disposition: E | Payer: Medicare Other | Attending: Internal Medicine | Admitting: Internal Medicine

## 2015-02-23 DIAGNOSIS — E611 Iron deficiency: Secondary | ICD-10-CM | POA: Diagnosis present

## 2015-02-23 DIAGNOSIS — I959 Hypotension, unspecified: Secondary | ICD-10-CM | POA: Diagnosis present

## 2015-02-23 DIAGNOSIS — Z66 Do not resuscitate: Secondary | ICD-10-CM | POA: Diagnosis present

## 2015-02-23 DIAGNOSIS — Z85828 Personal history of other malignant neoplasm of skin: Secondary | ICD-10-CM | POA: Diagnosis not present

## 2015-02-23 DIAGNOSIS — Z8249 Family history of ischemic heart disease and other diseases of the circulatory system: Secondary | ICD-10-CM | POA: Diagnosis not present

## 2015-02-23 DIAGNOSIS — Z853 Personal history of malignant neoplasm of breast: Secondary | ICD-10-CM | POA: Diagnosis not present

## 2015-02-23 DIAGNOSIS — I4891 Unspecified atrial fibrillation: Secondary | ICD-10-CM | POA: Diagnosis present

## 2015-02-23 DIAGNOSIS — J45909 Unspecified asthma, uncomplicated: Secondary | ICD-10-CM | POA: Diagnosis present

## 2015-02-23 DIAGNOSIS — Z79818 Long term (current) use of other agents affecting estrogen receptors and estrogen levels: Secondary | ICD-10-CM | POA: Diagnosis not present

## 2015-02-23 DIAGNOSIS — N390 Urinary tract infection, site not specified: Secondary | ICD-10-CM | POA: Diagnosis present

## 2015-02-23 DIAGNOSIS — E785 Hyperlipidemia, unspecified: Secondary | ICD-10-CM | POA: Diagnosis present

## 2015-02-23 DIAGNOSIS — I10 Essential (primary) hypertension: Secondary | ICD-10-CM | POA: Diagnosis present

## 2015-02-23 DIAGNOSIS — Z8041 Family history of malignant neoplasm of ovary: Secondary | ICD-10-CM | POA: Diagnosis not present

## 2015-02-23 DIAGNOSIS — Z86010 Personal history of colonic polyps: Secondary | ICD-10-CM

## 2015-02-23 DIAGNOSIS — K219 Gastro-esophageal reflux disease without esophagitis: Secondary | ICD-10-CM | POA: Diagnosis present

## 2015-02-23 DIAGNOSIS — I482 Chronic atrial fibrillation: Secondary | ICD-10-CM | POA: Diagnosis present

## 2015-02-23 DIAGNOSIS — N179 Acute kidney failure, unspecified: Secondary | ICD-10-CM | POA: Diagnosis present

## 2015-02-23 DIAGNOSIS — F0390 Unspecified dementia without behavioral disturbance: Secondary | ICD-10-CM | POA: Diagnosis present

## 2015-02-23 DIAGNOSIS — R197 Diarrhea, unspecified: Secondary | ICD-10-CM | POA: Diagnosis present

## 2015-02-23 DIAGNOSIS — K631 Perforation of intestine (nontraumatic): Secondary | ICD-10-CM | POA: Diagnosis not present

## 2015-02-23 DIAGNOSIS — Z515 Encounter for palliative care: Secondary | ICD-10-CM | POA: Diagnosis not present

## 2015-02-23 DIAGNOSIS — Z7901 Long term (current) use of anticoagulants: Secondary | ICD-10-CM

## 2015-02-23 DIAGNOSIS — M1990 Unspecified osteoarthritis, unspecified site: Secondary | ICD-10-CM | POA: Diagnosis present

## 2015-02-23 DIAGNOSIS — N189 Chronic kidney disease, unspecified: Secondary | ICD-10-CM | POA: Diagnosis present

## 2015-02-23 DIAGNOSIS — R109 Unspecified abdominal pain: Secondary | ICD-10-CM | POA: Diagnosis present

## 2015-02-23 DIAGNOSIS — K668 Other specified disorders of peritoneum: Secondary | ICD-10-CM | POA: Diagnosis present

## 2015-02-23 LAB — COMPREHENSIVE METABOLIC PANEL
ALT: 12 U/L — ABNORMAL LOW (ref 14–54)
ANION GAP: 11 (ref 5–15)
AST: 36 U/L (ref 15–41)
Albumin: 2.3 g/dL — ABNORMAL LOW (ref 3.5–5.0)
Alkaline Phosphatase: 54 U/L (ref 38–126)
BILIRUBIN TOTAL: 0.7 mg/dL (ref 0.3–1.2)
BUN: 46 mg/dL — AB (ref 6–20)
CO2: 26 mmol/L (ref 22–32)
CREATININE: 2.69 mg/dL — AB (ref 0.44–1.00)
Calcium: 8.3 mg/dL — ABNORMAL LOW (ref 8.9–10.3)
Chloride: 108 mmol/L (ref 101–111)
GFR calc non Af Amer: 14 mL/min — ABNORMAL LOW (ref >60)
GFR, EST AFRICAN AMERICAN: 16 mL/min — AB (ref >60)
Glucose, Bld: 128 mg/dL — ABNORMAL HIGH (ref 65–99)
Potassium: 3.2 mmol/L — ABNORMAL LOW (ref 3.5–5.1)
Sodium: 145 mmol/L (ref 135–145)
Total Protein: 6.7 g/dL (ref 6.5–8.1)

## 2015-02-23 LAB — TROPONIN I: Troponin I: 0.04 ng/mL — ABNORMAL HIGH (ref <0.031)

## 2015-02-23 LAB — C DIFFICILE QUICK SCREEN W PCR REFLEX
C DIFFICILE (CDIFF) INTERP: NEGATIVE
C DIFFICILE (CDIFF) TOXIN: NEGATIVE
C Diff antigen: NEGATIVE

## 2015-02-23 LAB — LIPASE, BLOOD: LIPASE: 31 U/L (ref 22–51)

## 2015-02-23 MED ORDER — PIPERACILLIN-TAZOBACTAM 3.375 G IVPB
INTRAVENOUS | Status: AC
  Administered 2015-02-23: 3.375 g via INTRAVENOUS
  Filled 2015-02-23: qty 50

## 2015-02-23 MED ORDER — IPRATROPIUM-ALBUTEROL 0.5-2.5 (3) MG/3ML IN SOLN
3.0000 mL | Freq: Once | RESPIRATORY_TRACT | Status: AC
  Administered 2015-02-23: 3 mL via RESPIRATORY_TRACT

## 2015-02-23 MED ORDER — PIPERACILLIN-TAZOBACTAM 3.375 G IVPB
3.3750 g | Freq: Once | INTRAVENOUS | Status: AC
  Administered 2015-02-23: 3.375 g via INTRAVENOUS

## 2015-02-23 MED ORDER — IPRATROPIUM-ALBUTEROL 0.5-2.5 (3) MG/3ML IN SOLN
RESPIRATORY_TRACT | Status: AC
  Administered 2015-02-23: 3 mL via RESPIRATORY_TRACT
  Filled 2015-02-23: qty 3

## 2015-02-23 MED ORDER — IOHEXOL 240 MG/ML SOLN
50.0000 mL | Freq: Once | INTRAMUSCULAR | Status: AC | PRN
  Administered 2015-02-23: 50 mL via ORAL

## 2015-02-23 NOTE — ED Notes (Signed)
MD in to see patient's family. Son and granddaughter. Patient to be put on pallative measures.

## 2015-02-23 NOTE — ED Provider Notes (Signed)
[facility] Emergency Department Provider Note  Time seen: 9:35 PM  I have reviewed the triage vital signs and the nursing notes.   HISTORY  Chief Complaint Diarrhea; Urinary Tract Infection; and Code Stroke    HPI Jill Ellison is a 81 y.o. female with a past medical history of hypertension, asthma, atrial fibrillation, breast cancer, dementia who presents from home with foul-smelling urine and diarrhea. According to EMS the patient lives with her granddaughter, the granddaughter called EMS as the patient had appeared uncomfortable, was having diarrhea, and foul-smelling urine. Patient has a history of dementia, but denies any complaints currently in the emergency department. Denies abdominal pain. However patient's abdomen does appear significantly distended and she is having active diarrhea in the ER bed. Currently awaiting family arrival for further history.     Past Medical History  Diagnosis Date  . Swelling of ankle   . SOB (shortness of breath)   . Hypertension   . Asthma   . Arthritis   . Atrial fibrillation 2011  . Cancer 2011    Left breast  . Cancer of leg   . Malignant neoplasm of upper-outer quadrant of female breast     left   . Memory loss   . Dementia   . GERD (gastroesophageal reflux disease)   . Hyperlipidemia   . Anemia   . Iron deficiency   . History of colonic polyps   . Lumbago     Patient Active Problem List   Diagnosis Date Noted  . Community acquired pneumonia 01/17/2015  . Sepsis 01/17/2015  . Atrial fibrillation with rapid ventricular response 01/17/2015  . Diastolic CHF, chronic 88/41/6606  . Anemia 10/13/2014  . HTN (hypertension) 03/24/2011  . Long term current use of anticoagulant 11/25/2010  . HYPERTENSION, BENIGN 03/06/2010  . ATRIAL FIBRILLATION 03/06/2010    Past Surgical History  Procedure Laterality Date  . Skin cancer excision  2008    right leg  . Mastectomy    . Colonoscopy  2010  .  Breast surgery Left 03/2010    mastectomy  . Breast biopsy Left 2009    Current Outpatient Rx  Name  Route  Sig  Dispense  Refill  . calcium-vitamin D (OSCAL WITH D) 500-200 MG-UNIT per tablet   Oral   Take 1 tablet by mouth 2 (two) times daily.           . Cholecalciferol (VITAMIN D3) 1000 UNITS CAPS   Oral   Take 1 capsule by mouth daily.           [name] diltiazem (CARDIZEM CD) 120 MG 24 hr capsule   Oral   Take 1 capsule (120 mg total) by mouth daily.   30 capsule   11   . fexofenadine (ALLEGRA) 180 MG tablet   Oral   Take 180 mg by mouth daily as needed for allergies.          . fluticasone (FLONASE) 50 MCG/ACT nasal spray   Each Nare   Place 2 sprays into both nostrils daily.   16 g   0   . furosemide (LASIX) 20 MG tablet      TAKE 1 TABLET EVERY DAY   30 tablet   6   . KLOR-CON M20 20 MEQ tablet      TAKE 1 TABLET BY MOUTH EVERY DAY   30 tablet   3   . letrozole (FEMARA) 2.5 MG tablet   Oral   Take 2.5 mg  by mouth daily.           [name] levalbuterol (XOPENEX HFA) 45 MCG/ACT inhaler   Inhalation   Inhale 1-2 puffs into the lungs every 4 (four) hours as needed.           [name] lisinopril (PRINIVIL,ZESTRIL) 20 MG tablet      TAKE 1 TABLET BY MOUTH DAILY   30 tablet   6   . Magnesium 250 MG TABS   Oral   Take 2 tablets by mouth daily.          . metoprolol tartrate (LOPRESSOR) 25 MG tablet   Oral   Take 0.5 tablets (12.5 mg total) by mouth 2 (two) times daily.   60 tablet   0   . Multiple Vitamin (MULTIVITAMIN) tablet   Oral   Take 1 tablet by mouth daily.           [name] warfarin (COUMADIN) 2 MG tablet      TAKE AS DIRECTED BY ANTICOAGULATION CLINIC Patient not taking: Reported on 01/17/2015   75 tablet   3     30 day supply     Allergies Review of patient's allergies indicates no known allergies.  Family History  Problem Relation Age of Onset  . Cancer Mother   . Cancer Father   . Cancer Brother   . Hypertension Brother   .  Heart attack Brother   . Coronary artery disease Brother   . Ovarian cancer Sister     Social History History  Substance Use Topics  . Smoking status: Never Smoker   . Smokeless tobacco: Never Used  . Alcohol Use: No    Review of Systems Unable to adequately obtain a review of systems due to patient's history of dementia.  ____________________________________________   PHYSICAL EXAM:  VITAL SIGNS: ED Triage Vitals  Enc Vitals Group     BP --      Pulse --      Resp --      Temp --      Temp src --      SpO2 --      Weight --      Height --      Head Cir --      Peak Flow --      Pain Score 02/16/2015 2112 0     Pain Loc --      Pain Edu? --      Excl. in [place]? --     Constitutional: Alert alert, calm, denies any complaints. Eyes: Normal exam ENT   Head: Normocephalic and atraumatic. Cardiovascular: Irregular, around 60 bpm. Respiratory: Normal respiratory effort without tachypnea nor retractions. Breath sounds are clear  Gastrointestinal: Moderate abdominal distention, hypoactive bowel sounds, tympanic percussion diffusely. Musculoskeletal: Nontender with normal range of motion in all extremities. Neurologic:  Patient speaks, but there does appear to be a degree of confusion, question accuracy of statements given her history of dementia, it is unclear of her baseline at this time. Skin:  Skin is warm, dry Psychiatric: Patient appears calm, with a normal mood, likely at baseline but awaiting family arrival to confirm.  ____________________________________________   RADIOLOGY  Three-way abdominal x-ray shows a large amount of abdominal free air.  ____________________________________________    INITIAL IMPRESSION / ASSESSMENT AND PLAN / ED COURSE  Pertinent labs & imaging results that were available during my care of the patient were reviewed by me and considered in my medical decision making (see chart for  details).  81 yo female with abdominal  distention, diarrhea, foul-smelling urine. The patient was recently treated with antibiotics for a pneumonia, now with yellow/green appearing stool raising the question for Clostridium difficile. Patient with hypoactive bowel sounds, and abdominal distention, question possible partial bowel obstruction. We will check labs, obtain a CT scan to further evaluate.  ----------------------------------------- 10:01 PM on 03/08/2015 -----------------------------------------  Three-way abdominal x-ray consistent with a large amount of free air. I discussed the patient with Dr. Pat Patrick. Dr. Pat Patrick suggested obtaining further data such as a CT scan and labs, it is unlikely that the patient would be a surgical candidate at 81 years old with a history of dementia and on Coumadin. Patient's blood pressures extremely hypotensive at this time currently receiving IV fluids. I have attempted to call family at the numbers that we have available. I checked the waiting room, and there is no family members currently here for the patient. We will continue to attempt to reach family. The patient does have a DO NOT RESUSCITATE order.  ----------------------------------------- 10:17 PM on 03/09/2015 -----------------------------------------  Family has arrived. I discussed with him the very grim prognosis of the patient. The patient has been on hospice care at home, we will transition more to a palliative care admission at this point, if the patient survives her ER stay.  ----------------------------------------- 10:30 PM on 03/09/2015 -----------------------------------------  Patient with nearly agonal like breathing. I do not believe she'll be able to tolerate lying flat for a CT scan, I have canceled the CT scan at this time. Patient will receive a one-time dose of Zosyn until they can officially transition the patient over to palliative care once more family arrives.  I have admitted the patient to Dr. Lavetta Nielsen, largely for  comfort care measures at this point. The family is very reasonable in their expectations, and agreeable to plan.    CRITICAL CARE Performed by: Harvest Dark   Total critical care time: 30 minutes  Critical care time was exclusive of separately billable procedures and treating other patients.  Critical care was necessary to treat or prevent imminent or life-threatening deterioration.  Critical care was time spent personally by me on the following activities: development of treatment plan with patient and/or surrogate as well as nursing, discussions with consultants, evaluation of patient's response to treatment, examination of patient, obtaining history from patient or surrogate, ordering and performing treatments and interventions, ordering and review of laboratory studies, ordering and review of radiographic studies, pulse oximetry and re-evaluation of patient's condition.  ____________________________________________   FINAL CLINICAL IMPRESSION(S) / ED DIAGNOSES  Diarrhea Bowel perforation Palliative care   Harvest Dark, MD 03/05/2015 2304

## 2015-02-23 NOTE — H&P (Signed)
[facility] [place] NAME: Jill Ellison    MR#:  [id number]  DATE OF BIRTH:  25-May-1917  DATE OF ADMISSION:  02/09/2015  PRIMARY CARE PHYSICIAN: Margarita Rana, MD   REQUESTING/REFERRING PHYSICIAN: Desiree Lucy  CHIEF COMPLAINT:   Chief Complaint  Patient presents with  . Diarrhea  . Urinary Tract Infection  . Code Stroke   brought in with complaints of abdominal distention, diarrhea, foul-smelling urine.  HISTORY OF PRESENT ILLNESS:  Jill Ellison  is a 81 y.o. female with below mentioned past medical history, on DO NOT RESUSCITATE status, under hospice care at home and brought in by the EMS with the complaints of found to have foul-smelling urine, abdominal distention. Patient has history of advanced dementia and lives with her granddaughter who is with the patient at this time this time and also  her healthcare power of attorney. Patient was found to be minimally responsive by the ED physician, was noted to be hypotensive and workup revealed abdominal x-ray with large amount of free air in the abdomen possibly secondary to bowel perforation. Lab work was significant for BUN/creatinine of 46/2.69. Surgery on call Dr. Geryl Rankins was consulted by the ED physician who evaluated the patient and opened the patient not a candidate for any surgical intervention hence advised admission to the medical team for continuation for comfort measures. Patient is on DO NOT RESUSCITATE status which is confirmed by the ED physician and the patient's family are in agreement with pursuing only comfort measures at this time. Hence hospitalist service was consulted by the ED physician to admit for continuation of comfort care measures. Patient is stuporous and not responsive, has irregular breathing and still hypotensive.  PAST MEDICAL HISTORY:   Past Medical History  Diagnosis Date  . Swelling of ankle   . SOB (shortness of breath)   . Hypertension   .  Asthma   . Arthritis   . Atrial fibrillation 2011  . Cancer 2011    Left breast  . Cancer of leg   . Malignant neoplasm of upper-outer quadrant of female breast     left   . Memory loss   . Dementia   . GERD (gastroesophageal reflux disease)   . Hyperlipidemia   . Anemia   . Iron deficiency   . History of colonic polyps   . Lumbago     PAST SURGICAL HISTORY:   Past Surgical History  Procedure Laterality Date  . Skin cancer excision  2008    right leg  . Mastectomy    . Colonoscopy  2010  . Breast surgery Left 03/2010    mastectomy  . Breast biopsy Left 2009    SOCIAL HISTORY:   History  Substance Use Topics  . Smoking status: Never Smoker   . Smokeless tobacco: Never Used  . Alcohol Use: No    FAMILY HISTORY:   Family History  Problem Relation Age of Onset  . Cancer Mother   . Cancer Father   . Cancer Brother   . Hypertension Brother   . Heart attack Brother   . Coronary artery disease Brother   . Ovarian cancer Sister     DRUG ALLERGIES:  No Known Allergies  REVIEW OF SYSTEMS:   Review of Systems  Unable to perform ROS: acuity of condition    MEDICATIONS AT HOME:   Prior to Admission medications   Medication Sig Start Date End Date Taking? Authorizing Provider  calcium-vitamin D Darron Doom  WITH D) 500-200 MG-UNIT per tablet Take 1 tablet by mouth 2 (two) times daily.      Historical Provider, MD  Cholecalciferol (VITAMIN D3) 1000 UNITS CAPS Take 1 capsule by mouth daily.      Historical Provider, MD  diltiazem (CARDIZEM CD) 120 MG 24 hr capsule Take 1 capsule (120 mg total) by mouth daily. 10/13/14   Minna Merritts, MD  fexofenadine (ALLEGRA) 180 MG tablet Take 180 mg by mouth daily as needed for allergies.     Historical Provider, MD  fluticasone (FLONASE) 50 MCG/ACT nasal spray Place 2 sprays into both nostrils daily. 01/28/15   Loletha Grayer, MD  furosemide (LASIX) 20 MG tablet TAKE 1 TABLET EVERY DAY 12/09/12   Minna Merritts, MD  KLOR-CON M20  20 MEQ tablet TAKE 1 TABLET BY MOUTH EVERY DAY 11/30/12   Minna Merritts, MD  letrozole [facility]) 2.5 MG tablet Take 2.5 mg by mouth daily.      Historical Provider, MD  levalbuterol [facility] HFA) 45 MCG/ACT inhaler Inhale 1-2 puffs into the lungs every 4 (four) hours as needed.      Historical Provider, MD  lisinopril (PRINIVIL,ZESTRIL) 20 MG tablet TAKE 1 TABLET BY MOUTH DAILY 04/09/12   Minna Merritts, MD  Magnesium 250 MG TABS Take 2 tablets by mouth daily.     Historical Provider, MD  metoprolol tartrate (LOPRESSOR) 25 MG tablet Take 0.5 tablets (12.5 mg total) by mouth 2 (two) times daily. 01/28/15   Loletha Grayer, MD  Multiple Vitamin (MULTIVITAMIN) tablet Take 1 tablet by mouth daily.      Historical Provider, MD  warfarin (COUMADIN) 2 MG tablet TAKE AS DIRECTED BY ANTICOAGULATION CLINIC Patient not taking: Reported on 01/17/2015 10/27/12   Minna Merritts, MD      VITAL SIGNS:  Blood pressure 44/24, pulse 90, resp. rate 31, SpO2 99 %.  PHYSICAL EXAMINATION:  Physical Exam  Constitutional: No distress.  Elderly lady, small built, stuporosed.  HENT:  Head: Normocephalic and atraumatic.  Right Ear: External ear normal.  Left Ear: External ear normal.  Nose: Nose normal.  Mouth/Throat: Oropharynx is clear and moist. No oropharyngeal exudate.  Eyes: Right eye exhibits no discharge. Left eye exhibits no discharge. No scleral icterus.  Neck: Neck supple. No JVD present. No thyromegaly present.  Cardiovascular: Normal rate, normal heart sounds and intact distal pulses.  Exam reveals no friction rub.   No murmur heard. Irregularly irregular  Respiratory: Breath sounds normal. No respiratory distress. She has no wheezes. She has no rales. She exhibits no tenderness.  Shallow and irregular breathing +  GI: She exhibits distension. She exhibits no mass. There is no tenderness. There is no rebound and no guarding.  Hypoactive bowel sounds +  Musculoskeletal: She exhibits no edema.   Unable to perform a detailed examination because of medical acuity   Lymphadenopathy:    She has no cervical adenopathy.  Neurological: She displays normal reflexes. She exhibits normal muscle tone.  Pt is stupurosed.  Skin: No rash noted. No erythema.  Psychiatric:  Unable to assess because of medical acuity   LABORATORY PANEL:   CBC  Recent Labs Lab 02/13/2015 2128  WBC 5.7  HGB 10.9*  HCT 34.6*  PLT 429   ------------------------------------------------------------------------------------------------------------------  Chemistries   Recent Labs Lab 02/25/2015 2128  NA 145  K 3.2*  CL 108  CO2 26  GLUCOSE 128*  BUN 46*  CREATININE 2.69*  CALCIUM 8.3*  AST 36  ALT 12*  ALKPHOS 62  BILITOT 0.7   ------------------------------------------------------------------------------------------------------------------  Cardiac Enzymes  Recent Labs Lab 02/16/2015 2128  TROPONINI 0.04*   ------------------------------------------------------------------------------------------------------------------  RADIOLOGY:  Dg Abd Acute W/chest  03/06/2015   CLINICAL DATA:  Abdominal pain, urinary tract infection, weakness, diarrhea, code stroke.  EXAM: DG ABDOMEN ACUTE W/ 1V CHEST  COMPARISON:  Chest 01/24/2015  FINDINGS: There is a large amount of free air in the abdomen. This raises suspicion for bowel perforation. Colon appears to be stool-filled. No significant bowel distention. Degenerative changes in the spine. Postoperative change in the left hip.  Mild cardiac enlargement. Pulmonary vascularity is normal for technique. Peribronchial thickening suggesting chronic bronchitis. No focal airspace disease or consolidation. Linear lucency along the descending aorta may indicate pneumo mediastinum. No pneumothorax. Calcified and tortuous aorta.  IMPRESSION: Large amount of free air in the abdomen, likely indicating bowel perforation. Possible pneumo mediastinum. No evidence of active  pulmonary disease.  These results were called by telephone at the time of interpretation on 02/11/2015 at 9:51 pm to Dr. Harvest Dark , who verbally acknowledged these results.   Electronically Signed   By: Lucienne Capers M.D.   On: 02/10/2015 21:52    EKG:   Orders placed or performed during the hospital encounter of 01/17/15  . ED EKG  . ED EKG  . EKG 12-Lead  . EKG 12-Lead  . EKG    IMPRESSION AND PLAN:   1. Abdominal distention, x-ray shows large amount of free air in the abdomen. Likely bowel perforation.  2. Diarrhea, rule out infectious causes. 3. Advanced dementia, on comfort measures under hospice care at home. 4. History of chronic atrial fibrillation, rate controlled. 5. Hypotension.  Plan: Admit to MedSurg for continuation of comfort measures, maintain DO NOT RESUSCITATE orders. IV fluids, pain control measures, oxygen supplementation. Palliative care consult in the morning it    All the records are reviewed and case discussed with ED provider. Management plans discussed with the patient, family and they are in agreement.  CODE STATUS: DO NOT RESUSCITATE. Patient on comfort measures only  TOTAL TIME TAKING CARE OF THIS PATIENT: 50 minutes.    Juluis Mire M.D on 02/09/2015 at 11:43 PM  Between 7am to 6pm - Pager - [phone]  After 6pm go to [email or web address] - password [email or web address]  [facility]  Office  [phone]  CC: Primary care physician; Margarita Rana, MD

## 2015-02-23 NOTE — Progress Notes (Addendum)
   02/15/2015 2300  Clinical Encounter Type  Visited With Patient and family together  Visit Type Initial  Referral From Nurse  Consult/Referral To Chaplain  Spiritual Encounters  Spiritual Needs Prayer  Stress Factors  Family Stress Factors Loss   Faith Tradition: [employer] Status: potential abdomin rupture, potential palliative care/slightly oriented, trying to talk enteric precautions  Age/sex: 55yr female Family: very supportive unit, only son and daughter and family close friend by bedside Visit Assessment: The chaplain was asked to pray a prayer of healing and participate in the Lord's prayer. The family has been given the opportunity to share a private moment with their love one.  The chaplain and pastoral care are available 24x7 via pager, [phone] or by submitting an online order Visit Assessment:

## 2015-02-23 NOTE — ED Notes (Signed)
Patient from home. Called EMS for abdominal pain, UTI, weakness.

## 2015-02-24 LAB — CBC WITH DIFFERENTIAL/PLATELET
BASOS ABS: 0.1 10*3/uL (ref 0.0–0.1)
BASOS PCT: 1 % (ref 0–1)
Band Neutrophils: 24 % — ABNORMAL HIGH (ref 0–10)
Blasts: 0 %
EOS ABS: 0.1 10*3/uL (ref 0.0–0.7)
Eosinophils Relative: 1 % (ref 0–5)
HEMATOCRIT: 34.6 % — AB (ref 35.0–47.0)
Hemoglobin: 10.9 g/dL — ABNORMAL LOW (ref 12.0–16.0)
LYMPHS ABS: 1.3 10*3/uL (ref 0.7–4.0)
LYMPHS PCT: 23 % (ref 12–46)
MCH: 31.1 pg (ref 26.0–34.0)
MCHC: 31.5 g/dL — ABNORMAL LOW (ref 32.0–36.0)
MCV: 98.7 fL (ref 80.0–100.0)
MONOS PCT: 1 % — AB (ref 3–12)
Metamyelocytes Relative: 4 %
Monocytes Absolute: 0.1 10*3/uL (ref 0.1–1.0)
Myelocytes: 0 %
NEUTROS ABS: 4.1 10*3/uL (ref 1.7–7.7)
Neutrophils Relative %: 46 % (ref 43–77)
Other: 0 %
Platelets: 429 10*3/uL (ref 150–440)
Promyelocytes Absolute: 0 %
RBC: 3.51 MIL/uL — ABNORMAL LOW (ref 3.80–5.20)
RDW: 16.1 % — ABNORMAL HIGH (ref 11.5–14.5)
WBC: 5.7 10*3/uL (ref 3.6–11.0)
nRBC: 0 /100 WBC

## 2015-02-25 ENCOUNTER — Ambulatory Visit: Payer: Self-pay | Admitting: Cardiovascular Disease

## 2015-02-25 DIAGNOSIS — I4891 Unspecified atrial fibrillation: Secondary | ICD-10-CM

## 2015-02-25 DIAGNOSIS — Z7901 Long term (current) use of anticoagulants: Secondary | ICD-10-CM

## 2015-02-25 NOTE — Discharge Summary (Signed)
[place] at [facility]    Death Note     Death Note please see Last Note for all details.   In breif - Jill Ellison is 81 Y/O with hx of dementia, htn, chronic a/fib, on DNR status under care of hospice at home admitted with abdominal distension and diagnosed to have pneumoperitoneum possibly 2/2 bowel perforation, seen by surgery on call in ED and deemed not a surgical candidate b/o advanced age and co-morbid conditions, admitted for further continuation of comfort measures. Discussed with pts son and grand daughter Jill Ellison) about pts critical condition, want only comfort measures at this time. Pt was stuporosed, hypotensive, continued on iv fluids and pain control measures. Pt's condition deteriorated further and passed away at 0026 hrs on 09-Mar-2015.   Jill Ellison CHY:[id number] is a 81 y.o. female, Outpatient Primary MD for the patient is Jill Rana, MD  Pronounced dead by Jill Ellison on Mar 09, 2015 at 0026 hrs     @                   Cause of death pneumoperitoneum possibly 2/2 bowel perforation.   Juluis Mire M.D on 02/25/2015 at 8:53 AM  [place] [facility] [place]  Total clinical and documentation time for today Under 30 minutes   Last Note

## 2015-03-11 NOTE — ED Notes (Signed)
pts family made aware that she is actively dying and admitting MD put in a order for RN to pronounce death. Pt's family understands everything an doing well with the process. I talked to admitting MD and charge nurse about holding pt in the ER due to her rapid decline and quick passing. MD and charge nurse ok with the situation and family is happy that we aren't moving her.

## 2015-03-11 NOTE — ED Notes (Signed)
Pt passed a way at 0026 family at the bedside nursing and patient relations there to.

## 2015-03-11 NOTE — ED Notes (Signed)
Pt's  family left pt at Jill Ellison.

## 2015-03-11 NOTE — ED Notes (Signed)
Took over as nurse at this time. Pt noted to be declining due to breathing rattling lung sounds b/p is dropping. Pt on 7L of oxygen [place]. Family at the bedside patient relations at pt side. Pt is doing well and pt is comfortable.

## 2015-03-11 DEATH — deceased

## 2015-12-27 IMAGING — CR DG CHEST 1V PORT
1 series · 1 of 1 positions shown · non-contrast
Comparison: 01/17/2015

CLINICAL DATA: Follow-up left upper lobe pneumonia

EXAM:
PORTABLE CHEST - 1 VIEW

[ap]
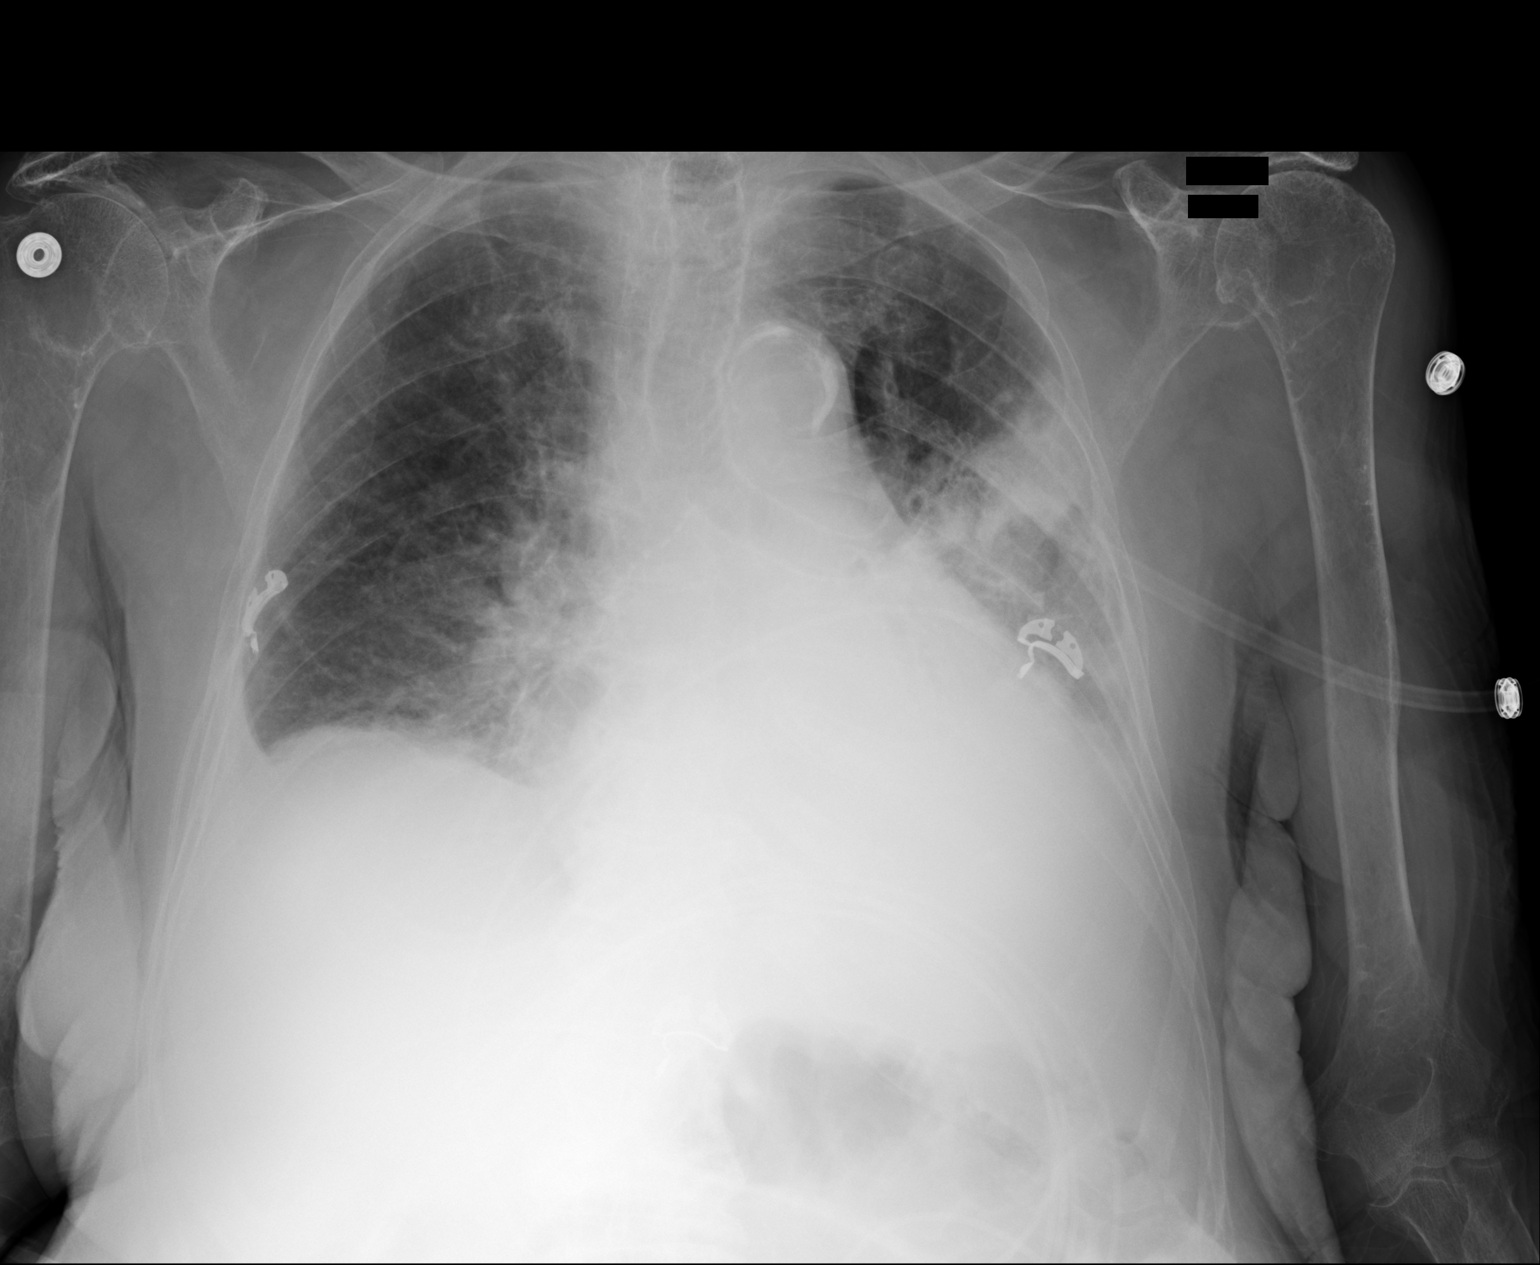

[1 of 1 positions shown; findings below may reference images not displayed]

FINDINGS: Cardiomediastinal silhouette is stable. Atherosclerotic
calcifications of thoracic aorta again noted. Persistent left upper
lobe pneumonia. Worsening in aeration in left lower lobe suspicious
for atelectasis or infiltrate. Probable small bilateral pleural
effusion. No pulmonary edema.
IMPRESSION: Persistent left upper lobe pneumonia. Worsening in aeration in left
lower lobe suspicious for atelectasis or infiltrate. Probable small
bilateral pleural effusion. No pulmonary edema.

## 2016-01-29 IMAGING — CR DG ABDOMEN ACUTE W/ 1V CHEST
1 series · 4 of 4 positions shown · non-contrast
Comparison: Chest 01/24/2015

CLINICAL DATA: Abdominal pain, urinary tract infection, weakness,
diarrhea, code stroke.

EXAM:
DG ABDOMEN ACUTE W/ 1V CHEST

[Series 1: t abdomen supine · 0.14mm/px · 4 of 4 slices shown]
[im 1/4]
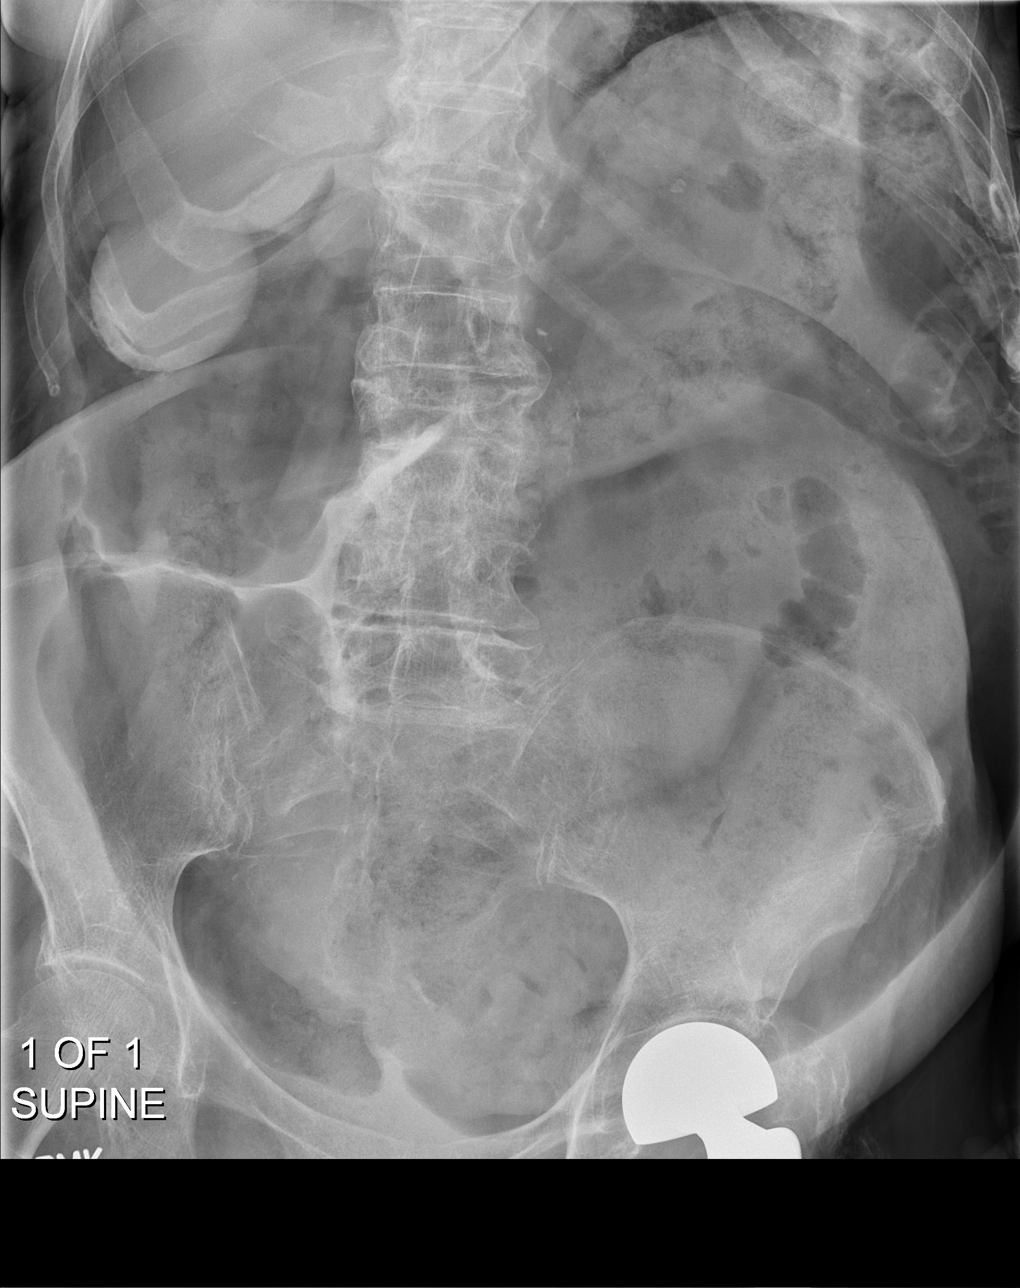
[im 2/4]
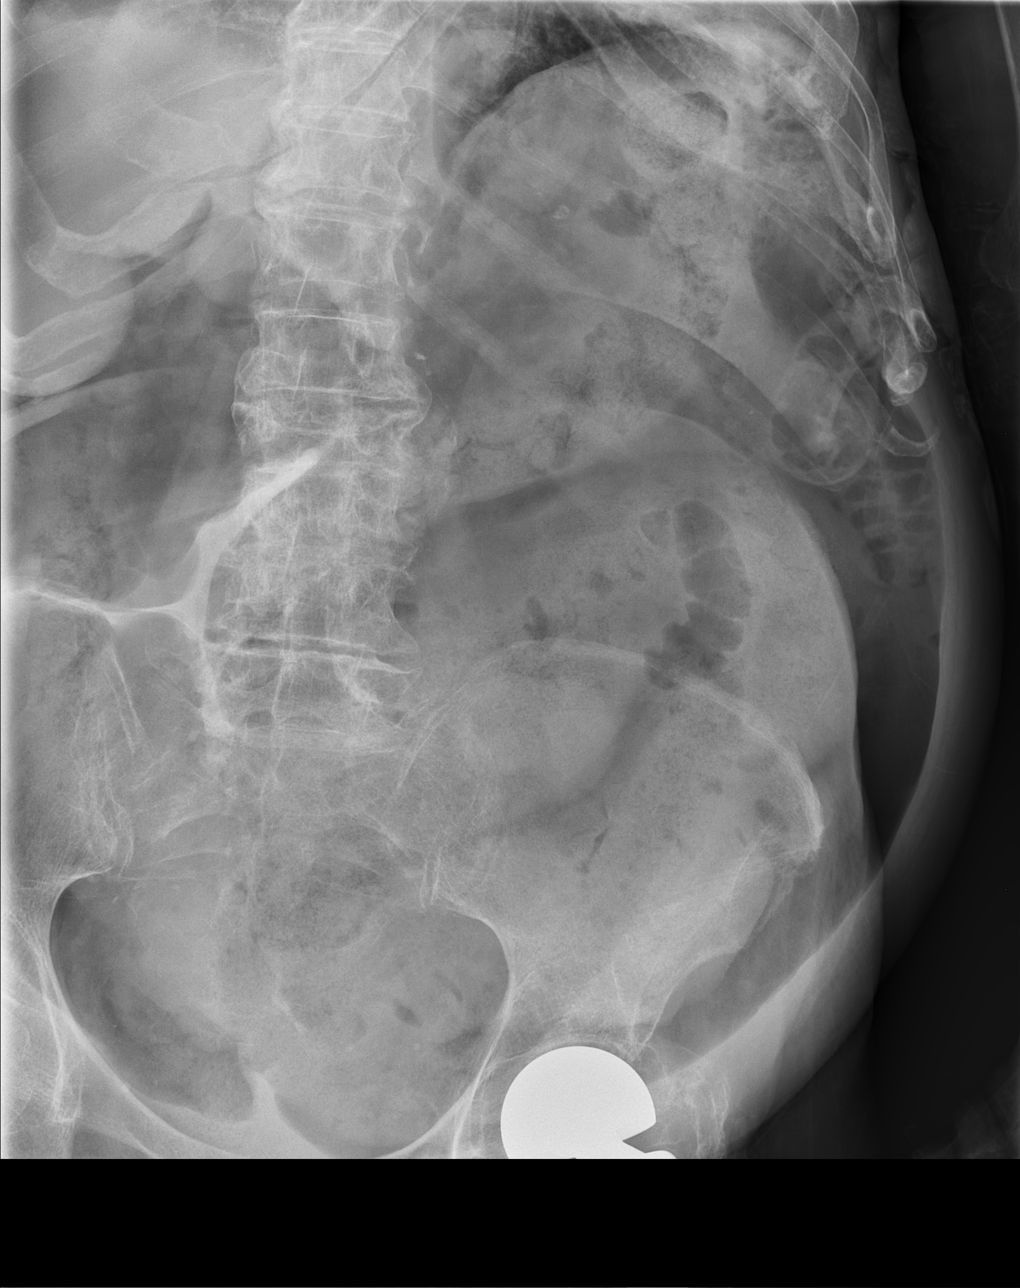
[im 3/4]
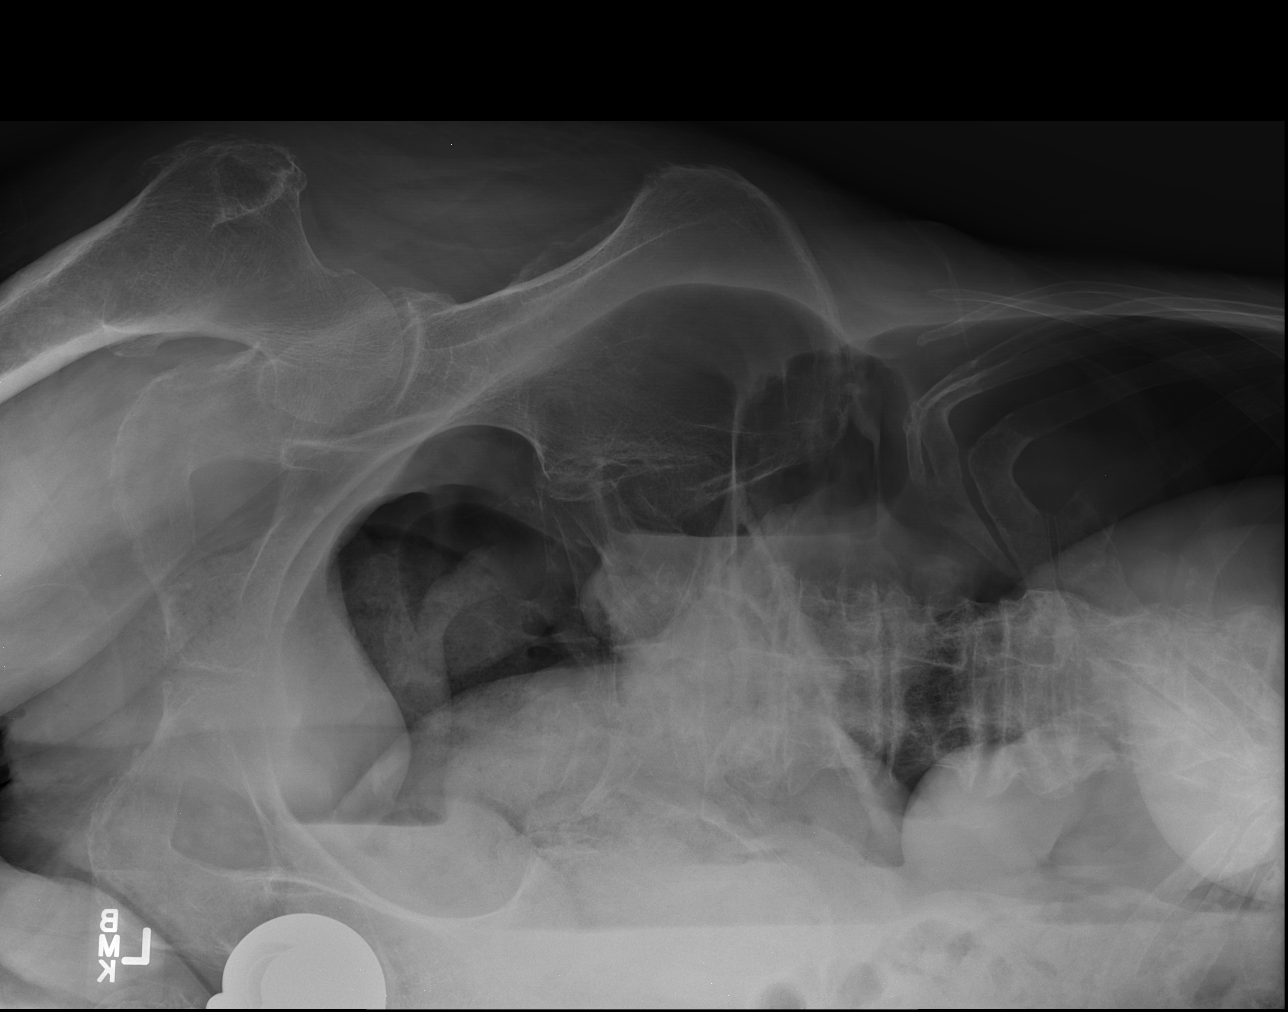
[im 4/4]
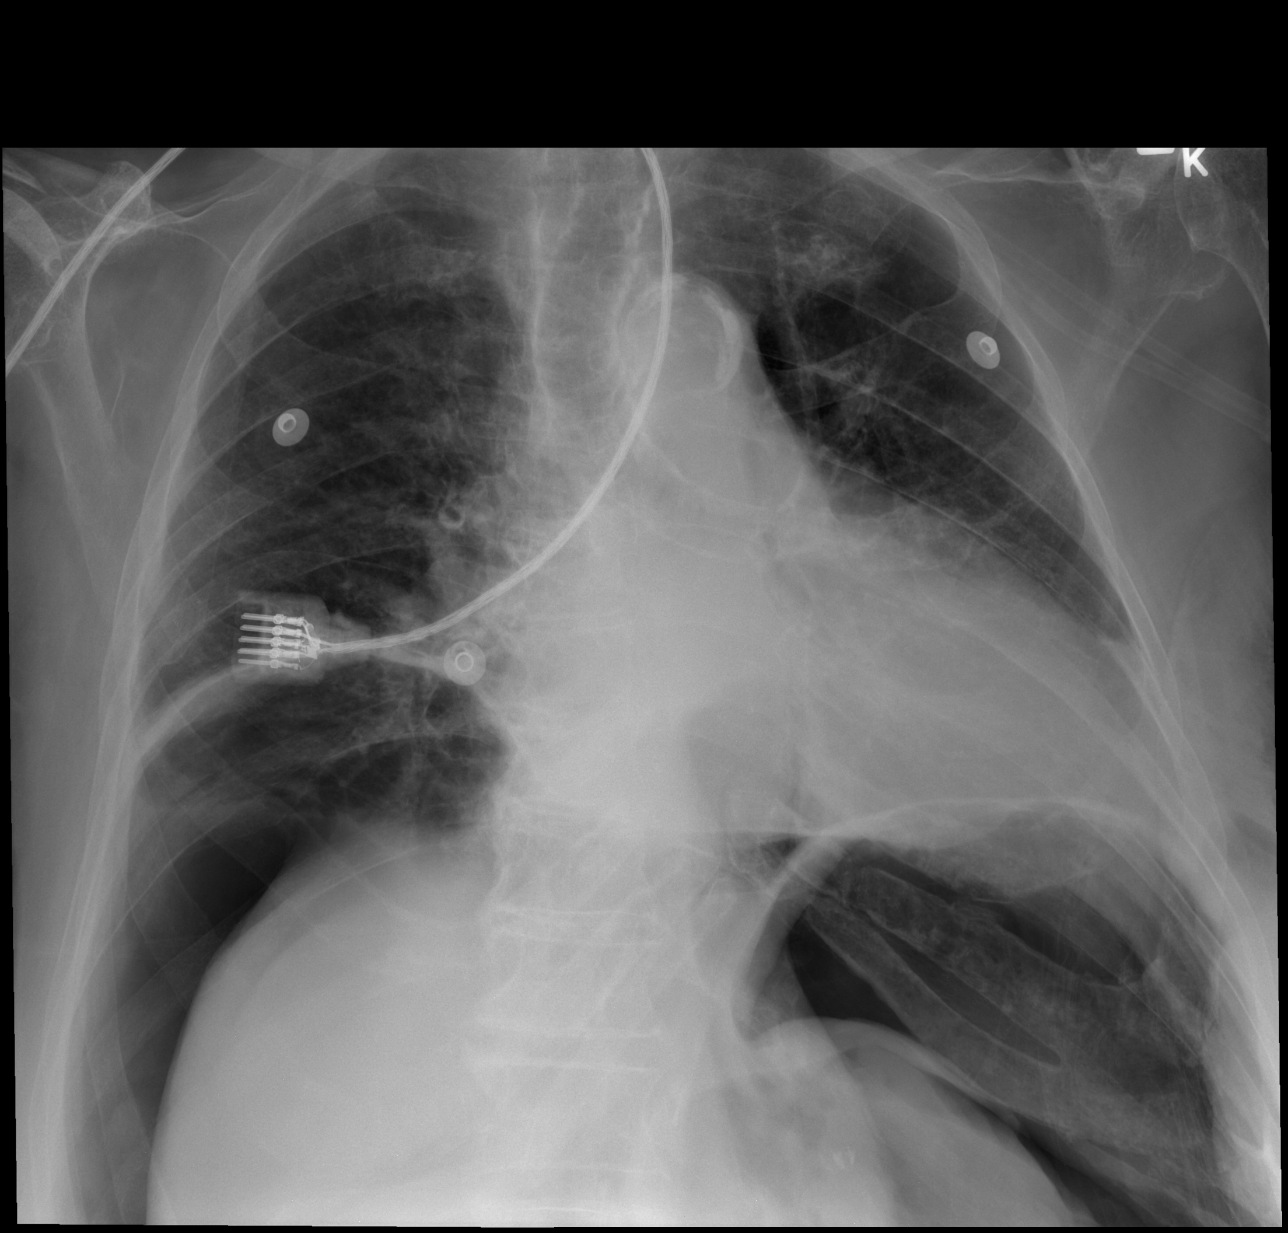

[4 of 4 positions shown; findings below may reference images not displayed]

FINDINGS: There is a large amount of free air in the abdomen. This raises
suspicion for bowel perforation. Colon appears to be stool-filled.
No significant bowel distention. Degenerative changes in the spine.
Postoperative change in the left hip.

Mild cardiac enlargement. Pulmonary vascularity is normal for
technique. Peribronchial thickening suggesting chronic bronchitis.
No focal airspace disease or consolidation. Linear lucency along the
descending aorta may indicate pneumo mediastinum. No pneumothorax.
Calcified and tortuous aorta.
IMPRESSION: Large amount of free air in the abdomen, likely indicating bowel
perforation. Possible pneumo mediastinum. No evidence of active
pulmonary disease.

These results were called by telephone at the time of interpretation
on 02/23/2015 at [DATE] to Dr. KYEM MHAJESTY , who verbally
acknowledged these results.
